# Patient Record
Sex: Male | Born: 1945 | Race: White | Hispanic: No | Marital: Married | Smoking: Former smoker
Health system: Southern US, Community
[De-identification: ages and names within clinical notes are randomized; demographics above are authoritative.]

## PROBLEM LIST (undated history)

## (undated) DIAGNOSIS — F0280 Dementia in other diseases classified elsewhere without behavioral disturbance: Secondary | ICD-10-CM

## (undated) DIAGNOSIS — F411 Generalized anxiety disorder: Secondary | ICD-10-CM

## (undated) DIAGNOSIS — M791 Myalgia, unspecified site: Secondary | ICD-10-CM

## (undated) DIAGNOSIS — I10 Essential (primary) hypertension: Secondary | ICD-10-CM

## (undated) DIAGNOSIS — E669 Obesity, unspecified: Secondary | ICD-10-CM

## (undated) DIAGNOSIS — G4733 Obstructive sleep apnea (adult) (pediatric): Secondary | ICD-10-CM

## (undated) DIAGNOSIS — M25519 Pain in unspecified shoulder: Secondary | ICD-10-CM

## (undated) DIAGNOSIS — R911 Solitary pulmonary nodule: Secondary | ICD-10-CM

## (undated) DIAGNOSIS — K219 Gastro-esophageal reflux disease without esophagitis: Secondary | ICD-10-CM

## (undated) DIAGNOSIS — R918 Other nonspecific abnormal finding of lung field: Secondary | ICD-10-CM

## (undated) DIAGNOSIS — I442 Atrioventricular block, complete: Secondary | ICD-10-CM

## (undated) DIAGNOSIS — E042 Nontoxic multinodular goiter: Secondary | ICD-10-CM

## (undated) DIAGNOSIS — I2510 Atherosclerotic heart disease of native coronary artery without angina pectoris: Secondary | ICD-10-CM

## (undated) DIAGNOSIS — K635 Polyp of colon: Secondary | ICD-10-CM

## (undated) DIAGNOSIS — E7800 Pure hypercholesterolemia, unspecified: Secondary | ICD-10-CM

## (undated) DIAGNOSIS — E785 Hyperlipidemia, unspecified: Secondary | ICD-10-CM

## (undated) DIAGNOSIS — G309 Alzheimer's disease, unspecified: Secondary | ICD-10-CM

## (undated) DIAGNOSIS — E559 Vitamin D deficiency, unspecified: Secondary | ICD-10-CM

## (undated) DIAGNOSIS — F329 Major depressive disorder, single episode, unspecified: Secondary | ICD-10-CM

## (undated) DIAGNOSIS — F3289 Other specified depressive episodes: Secondary | ICD-10-CM

## (undated) HISTORY — DX: Vitamin D deficiency, unspecified: E55.9

## (undated) HISTORY — DX: Other nonspecific abnormal finding of lung field: R91.8

## (undated) HISTORY — DX: Polyp of colon: K63.5

## (undated) HISTORY — DX: Solitary pulmonary nodule: R91.1

## (undated) HISTORY — PX: ACHILLES TENDON REPAIR: SUR1153

## (undated) HISTORY — DX: Hyperlipidemia, unspecified: E78.5

## (undated) HISTORY — DX: Major depressive disorder, single episode, unspecified: F32.9

## (undated) HISTORY — DX: Pain in unspecified shoulder: M25.519

## (undated) HISTORY — DX: Gastro-esophageal reflux disease without esophagitis: K21.9

## (undated) HISTORY — DX: Essential (primary) hypertension: I10

## (undated) HISTORY — DX: Myalgia, unspecified site: M79.10

## (undated) HISTORY — DX: Atherosclerotic heart disease of native coronary artery without angina pectoris: I25.10

## (undated) HISTORY — DX: Obesity, unspecified: E66.9

## (undated) HISTORY — DX: Obstructive sleep apnea (adult) (pediatric): G47.33

## (undated) HISTORY — DX: Pure hypercholesterolemia, unspecified: E78.00

## (undated) HISTORY — DX: Other specified depressive episodes: F32.89

## (undated) HISTORY — DX: Generalized anxiety disorder: F41.1

## (undated) HISTORY — DX: Nontoxic multinodular goiter: E04.2

---

## 1998-02-23 HISTORY — PX: CORONARY ANGIOPLASTY WITH STENT PLACEMENT: SHX49

## 1998-03-12 ENCOUNTER — Ambulatory Visit (HOSPITAL_COMMUNITY): Admission: RE | Admit: 1998-03-12 | Discharge: 1998-03-13 | Payer: Self-pay | Admitting: Interventional Cardiology

## 1998-03-17 ENCOUNTER — Encounter (HOSPITAL_COMMUNITY): Admission: RE | Admit: 1998-03-17 | Discharge: 1998-06-15 | Payer: Self-pay | Admitting: Interventional Cardiology

## 1998-03-19 ENCOUNTER — Observation Stay (HOSPITAL_COMMUNITY): Admission: AD | Admit: 1998-03-19 | Discharge: 1998-03-20 | Payer: Self-pay | Admitting: Interventional Cardiology

## 1998-06-16 ENCOUNTER — Encounter (HOSPITAL_COMMUNITY): Admission: RE | Admit: 1998-06-16 | Discharge: 1998-09-14 | Payer: Self-pay | Admitting: Interventional Cardiology

## 1998-10-21 ENCOUNTER — Ambulatory Visit (HOSPITAL_COMMUNITY): Admission: RE | Admit: 1998-10-21 | Discharge: 1998-10-21 | Payer: Self-pay | Admitting: Interventional Cardiology

## 2000-01-31 ENCOUNTER — Encounter: Admission: RE | Admit: 2000-01-31 | Discharge: 2000-04-30 | Payer: Self-pay | Admitting: Family Medicine

## 2000-02-16 ENCOUNTER — Encounter: Admission: RE | Admit: 2000-02-16 | Discharge: 2000-02-16 | Payer: Self-pay | Admitting: Family Medicine

## 2000-02-16 ENCOUNTER — Encounter: Payer: Self-pay | Admitting: Family Medicine

## 2000-03-09 ENCOUNTER — Ambulatory Visit (HOSPITAL_BASED_OUTPATIENT_CLINIC_OR_DEPARTMENT_OTHER): Admission: RE | Admit: 2000-03-09 | Discharge: 2000-03-09 | Payer: Self-pay | Admitting: Interventional Cardiology

## 2000-05-03 ENCOUNTER — Encounter: Payer: Self-pay | Admitting: Interventional Cardiology

## 2000-05-03 ENCOUNTER — Ambulatory Visit (HOSPITAL_COMMUNITY): Admission: RE | Admit: 2000-05-03 | Discharge: 2000-05-03 | Payer: Self-pay | Admitting: Interventional Cardiology

## 2000-08-25 ENCOUNTER — Emergency Department (HOSPITAL_COMMUNITY): Admission: EM | Admit: 2000-08-25 | Discharge: 2000-08-25 | Payer: Self-pay | Admitting: Emergency Medicine

## 2003-04-21 ENCOUNTER — Encounter: Admission: RE | Admit: 2003-04-21 | Discharge: 2003-04-21 | Payer: Self-pay | Admitting: Family Medicine

## 2003-06-11 ENCOUNTER — Encounter (INDEPENDENT_AMBULATORY_CARE_PROVIDER_SITE_OTHER): Payer: Self-pay | Admitting: Specialist

## 2003-06-11 ENCOUNTER — Ambulatory Visit (HOSPITAL_COMMUNITY): Admission: RE | Admit: 2003-06-11 | Discharge: 2003-06-11 | Payer: Self-pay | Admitting: *Deleted

## 2004-08-03 ENCOUNTER — Ambulatory Visit (HOSPITAL_COMMUNITY): Admission: RE | Admit: 2004-08-03 | Discharge: 2004-08-03 | Payer: Self-pay | Admitting: *Deleted

## 2004-08-03 ENCOUNTER — Encounter (INDEPENDENT_AMBULATORY_CARE_PROVIDER_SITE_OTHER): Payer: Self-pay | Admitting: Specialist

## 2006-12-13 ENCOUNTER — Encounter: Admission: RE | Admit: 2006-12-13 | Discharge: 2006-12-13 | Payer: Self-pay | Admitting: Interventional Cardiology

## 2006-12-31 ENCOUNTER — Encounter: Admission: RE | Admit: 2006-12-31 | Discharge: 2006-12-31 | Payer: Self-pay | Admitting: Interventional Cardiology

## 2007-03-05 ENCOUNTER — Other Ambulatory Visit: Admission: RE | Admit: 2007-03-05 | Discharge: 2007-03-05 | Payer: Self-pay | Admitting: Interventional Radiology

## 2007-03-05 ENCOUNTER — Encounter: Admission: RE | Admit: 2007-03-05 | Discharge: 2007-03-05 | Payer: Self-pay | Admitting: Endocrinology

## 2007-03-05 ENCOUNTER — Encounter (INDEPENDENT_AMBULATORY_CARE_PROVIDER_SITE_OTHER): Payer: Self-pay | Admitting: Interventional Radiology

## 2007-09-16 ENCOUNTER — Emergency Department (HOSPITAL_BASED_OUTPATIENT_CLINIC_OR_DEPARTMENT_OTHER): Admission: EM | Admit: 2007-09-16 | Discharge: 2007-09-16 | Payer: Self-pay | Admitting: Emergency Medicine

## 2008-01-13 ENCOUNTER — Encounter: Admission: RE | Admit: 2008-01-13 | Discharge: 2008-01-13 | Payer: Self-pay | Admitting: Interventional Cardiology

## 2008-01-14 ENCOUNTER — Inpatient Hospital Stay (HOSPITAL_BASED_OUTPATIENT_CLINIC_OR_DEPARTMENT_OTHER): Admission: RE | Admit: 2008-01-14 | Discharge: 2008-01-14 | Payer: Self-pay | Admitting: Interventional Cardiology

## 2008-01-14 ENCOUNTER — Encounter: Payer: Self-pay | Admitting: Surgery

## 2008-01-14 ENCOUNTER — Ambulatory Visit (HOSPITAL_COMMUNITY): Admission: RE | Admit: 2008-01-14 | Discharge: 2008-01-14 | Payer: Self-pay | Admitting: Surgery

## 2008-01-14 ENCOUNTER — Ambulatory Visit: Payer: Self-pay | Admitting: Vascular Surgery

## 2008-01-21 ENCOUNTER — Ambulatory Visit: Payer: Self-pay | Admitting: Surgery

## 2008-03-26 ENCOUNTER — Encounter: Admission: RE | Admit: 2008-03-26 | Discharge: 2008-03-26 | Payer: Self-pay | Admitting: Internal Medicine

## 2009-05-29 ENCOUNTER — Encounter: Admission: RE | Admit: 2009-05-29 | Discharge: 2009-05-29 | Payer: Self-pay | Admitting: Neurology

## 2009-06-30 ENCOUNTER — Ambulatory Visit (HOSPITAL_BASED_OUTPATIENT_CLINIC_OR_DEPARTMENT_OTHER): Admission: RE | Admit: 2009-06-30 | Discharge: 2009-06-30 | Payer: Self-pay | Admitting: Neurology

## 2009-07-03 ENCOUNTER — Ambulatory Visit: Payer: Self-pay | Admitting: Internal Medicine

## 2009-10-03 ENCOUNTER — Ambulatory Visit: Payer: Self-pay | Admitting: Diagnostic Radiology

## 2009-10-03 ENCOUNTER — Emergency Department (HOSPITAL_BASED_OUTPATIENT_CLINIC_OR_DEPARTMENT_OTHER): Admission: EM | Admit: 2009-10-03 | Discharge: 2009-10-03 | Payer: Self-pay | Admitting: Emergency Medicine

## 2009-10-15 ENCOUNTER — Ambulatory Visit: Payer: Self-pay | Admitting: Internal Medicine

## 2009-10-15 ENCOUNTER — Inpatient Hospital Stay (HOSPITAL_COMMUNITY): Admission: EM | Admit: 2009-10-15 | Discharge: 2009-10-16 | Payer: Self-pay | Admitting: Emergency Medicine

## 2009-10-27 ENCOUNTER — Ambulatory Visit (HOSPITAL_COMMUNITY): Admission: RE | Admit: 2009-10-27 | Discharge: 2009-10-27 | Payer: Self-pay | Admitting: Family Medicine

## 2009-11-09 ENCOUNTER — Ambulatory Visit: Payer: Self-pay | Admitting: Thoracic Surgery

## 2009-11-12 ENCOUNTER — Ambulatory Visit (HOSPITAL_COMMUNITY): Admission: RE | Admit: 2009-11-12 | Discharge: 2009-11-12 | Payer: Self-pay | Admitting: Interventional Cardiology

## 2009-11-17 ENCOUNTER — Ambulatory Visit (HOSPITAL_COMMUNITY): Admission: RE | Admit: 2009-11-17 | Discharge: 2009-11-17 | Payer: Self-pay | Admitting: Thoracic Surgery

## 2009-11-23 ENCOUNTER — Inpatient Hospital Stay (HOSPITAL_BASED_OUTPATIENT_CLINIC_OR_DEPARTMENT_OTHER): Admission: RE | Admit: 2009-11-23 | Discharge: 2009-11-23 | Payer: Self-pay | Admitting: Interventional Cardiology

## 2009-12-01 ENCOUNTER — Ambulatory Visit: Payer: Self-pay | Admitting: Thoracic Surgery

## 2010-01-19 ENCOUNTER — Encounter
Admission: RE | Admit: 2010-01-19 | Discharge: 2010-01-19 | Payer: Self-pay | Source: Home / Self Care | Attending: Thoracic Surgery | Admitting: Thoracic Surgery

## 2010-01-19 ENCOUNTER — Ambulatory Visit: Payer: Self-pay | Admitting: Thoracic Surgery

## 2010-02-12 ENCOUNTER — Other Ambulatory Visit: Payer: Self-pay | Admitting: Thoracic Surgery

## 2010-02-12 ENCOUNTER — Encounter: Payer: Self-pay | Admitting: Family Medicine

## 2010-02-12 DIAGNOSIS — R911 Solitary pulmonary nodule: Secondary | ICD-10-CM

## 2010-03-16 ENCOUNTER — Other Ambulatory Visit: Payer: Self-pay | Admitting: Internal Medicine

## 2010-03-16 DIAGNOSIS — E049 Nontoxic goiter, unspecified: Secondary | ICD-10-CM

## 2010-03-17 ENCOUNTER — Ambulatory Visit
Admission: RE | Admit: 2010-03-17 | Discharge: 2010-03-17 | Disposition: A | Discharge status: Home / Self Care | Payer: BC Managed Care – PPO | Source: Ambulatory Visit | Attending: Internal Medicine | Admitting: Internal Medicine

## 2010-03-17 DIAGNOSIS — E049 Nontoxic goiter, unspecified: Secondary | ICD-10-CM

## 2010-03-22 ENCOUNTER — Ambulatory Visit: Payer: Self-pay | Admitting: Thoracic Surgery

## 2010-03-22 ENCOUNTER — Other Ambulatory Visit: Payer: Self-pay

## 2010-04-01 ENCOUNTER — Other Ambulatory Visit: Payer: Self-pay | Admitting: Internal Medicine

## 2010-04-05 ENCOUNTER — Ambulatory Visit
Admission: RE | Admit: 2010-04-05 | Discharge: 2010-04-05 | Disposition: A | Discharge status: Home / Self Care | Payer: BC Managed Care – PPO | Source: Ambulatory Visit | Attending: Thoracic Surgery | Admitting: Thoracic Surgery

## 2010-04-05 ENCOUNTER — Ambulatory Visit (INDEPENDENT_AMBULATORY_CARE_PROVIDER_SITE_OTHER): Payer: BC Managed Care – PPO | Admitting: Thoracic Surgery

## 2010-04-05 DIAGNOSIS — R911 Solitary pulmonary nodule: Secondary | ICD-10-CM

## 2010-04-05 DIAGNOSIS — J8410 Pulmonary fibrosis, unspecified: Secondary | ICD-10-CM

## 2010-04-06 LAB — CBC
MCH: 29.6 pg (ref 26.0–34.0)
MCHC: 34.1 g/dL (ref 30.0–36.0)
MCV: 86.8 fL (ref 78.0–100.0)
Platelets: 246 10*3/uL (ref 150–400)

## 2010-04-06 LAB — APTT: aPTT: 29 seconds (ref 24–37)

## 2010-04-06 LAB — PROTIME-INR: INR: 0.99 (ref 0.00–1.49)

## 2010-04-06 NOTE — Assessment & Plan Note (Signed)
OFFICE VISIT  DUVID, SMALLS DOB:  [birth year]                                        April 05, 2010 CHART #:  [id number]  Mr. Dunlow comes in today for 62-month followup.  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  Since his last visit, he has had no shortness of breath and has otherwise been stable from a medical standpoint.  PHYSICAL EXAMINATION:  Vital Signs:  Blood pressure 128/80, pulse of 64, respirations 18, O2 sat 95% on room air.  Heart:  Regular rate and rhythm without murmurs, rubs, or gallops.  Lungs are clear.  Chest CT today shows significant improvement in the left lower lobe process.  Now, there is just a small area of haziness in this area.  ASSESSMENT AND PLAN:  Dr. [name] saw the patient today and reviewed his films.  We will plan to see him back in 6 months with a repeat CT scan. The patient has an appointment this week with Dr. [name] regarding his coronary artery disease and Dr. [name] has given him the okay to proceed with CABG if he desires to do that at this time.  We will see him back in 6 months with a CT scan.  Coral Ceo, P.A.  GC/MEDQ  D:  04/05/2010  T:  04/06/2010  Job:  [id number]  cc:   Lyn Records, M.D.

## 2010-04-07 LAB — URINALYSIS, ROUTINE W REFLEX MICROSCOPIC
Nitrite: NEGATIVE
Specific Gravity, Urine: 1.036 — ABNORMAL HIGH (ref 1.005–1.030)
Urobilinogen, UA: 0.2 mg/dL (ref 0.0–1.0)
pH: 5.5 (ref 5.0–8.0)

## 2010-04-07 LAB — TROPONIN I: Troponin I: 0.01 ng/mL (ref 0.00–0.06)

## 2010-04-07 LAB — DIFFERENTIAL
Basophils Absolute: 0 10*3/uL (ref 0.0–0.1)
Basophils Relative: 0 % (ref 0–1)
Eosinophils Absolute: 0.3 10*3/uL (ref 0.0–0.7)
Eosinophils Relative: 4 % (ref 0–5)
Lymphocytes Relative: 23 % (ref 12–46)
Monocytes Absolute: 1 10*3/uL (ref 0.1–1.0)
Monocytes Relative: 11 % (ref 3–12)
Monocytes Relative: 10 % (ref 3–12)
Neutro Abs: 5.6 10*3/uL (ref 1.7–7.7)
Neutro Abs: 4.8 10*3/uL (ref 1.7–7.7)
Neutrophils Relative %: 63 % (ref 43–77)

## 2010-04-07 LAB — BASIC METABOLIC PANEL
Calcium: 9.2 mg/dL (ref 8.4–10.5)
GFR calc Af Amer: >60 mL/min (ref >60)
GFR calc non Af Amer: >60 mL/min (ref >60)
Glucose, Bld: 103 mg/dL — ABNORMAL HIGH (ref 70–99)
Potassium: 3.9 mEq/L (ref 3.5–5.1)
Sodium: 143 mEq/L (ref 135–145)

## 2010-04-07 LAB — CK TOTAL AND CKMB (NOT AT ARMC)
CK, MB: 11.2 ng/mL (ref 0.3–4.0)
Relative Index: 4.3 — ABNORMAL HIGH (ref 0.0–2.5)

## 2010-04-07 LAB — POCT I-STAT, CHEM 8
BUN: 15 mg/dL (ref 6–23)
Glucose, Bld: 96 mg/dL (ref 70–99)
HCT: 40 % (ref 39.0–52.0)
Potassium: 3.8 mEq/L (ref 3.5–5.1)

## 2010-04-07 LAB — CBC
HCT: 37.8 % — ABNORMAL LOW (ref 39.0–52.0)
HCT: 38.2 % — ABNORMAL LOW (ref 39.0–52.0)
Hemoglobin: 13.1 g/dL (ref 13.0–17.0)
Hemoglobin: 13.3 g/dL (ref 13.0–17.0)
MCHC: 34.9 g/dL (ref 30.0–36.0)
RBC: 4.38 MIL/uL (ref 4.22–5.81)
RBC: 4.28 MIL/uL (ref 4.22–5.81)
WBC: 9.7 10*3/uL (ref 4.0–10.5)
WBC: 7.6 10*3/uL (ref 4.0–10.5)

## 2010-04-07 LAB — LIPID PANEL
Total CHOL/HDL Ratio: 3.4 RATIO
VLDL: 16 mg/dL (ref 0–40)

## 2010-04-07 LAB — GLUCOSE, CAPILLARY: Glucose-Capillary: 94 mg/dL (ref 70–99)

## 2010-04-07 LAB — POCT CARDIAC MARKERS: CKMB, poc: 9.1 ng/mL (ref 1.0–8.0)

## 2010-04-07 LAB — CARDIAC PANEL(CRET KIN+CKTOT+MB+TROPI)
CK, MB: 12.6 ng/mL (ref 0.3–4.0)
Total CK: 265 U/L — ABNORMAL HIGH (ref 7–232)
Troponin I: 0.01 ng/mL (ref 0.00–0.06)
Troponin I: 0.02 ng/mL (ref 0.00–0.06)

## 2010-04-07 LAB — PROTIME-INR
INR: 0.88 (ref 0.00–1.49)
Prothrombin Time: 12.1 seconds (ref 11.6–15.2)

## 2010-04-28 ENCOUNTER — Encounter (INDEPENDENT_AMBULATORY_CARE_PROVIDER_SITE_OTHER): Payer: BC Managed Care – PPO | Admitting: Cardiothoracic Surgery

## 2010-04-28 DIAGNOSIS — I2510 Atherosclerotic heart disease of native coronary artery without angina pectoris: Secondary | ICD-10-CM

## 2010-05-13 ENCOUNTER — Ambulatory Visit (HOSPITAL_COMMUNITY)
Admission: RE | Admit: 2010-05-13 | Discharge: 2010-05-13 | Disposition: A | Discharge status: Home / Self Care | Payer: BC Managed Care – PPO | Source: Ambulatory Visit | Attending: Cardiothoracic Surgery | Admitting: Cardiothoracic Surgery

## 2010-05-13 ENCOUNTER — Encounter (HOSPITAL_COMMUNITY)
Admission: RE | Admit: 2010-05-13 | Discharge: 2010-05-13 | Disposition: A | Discharge status: Home / Self Care | Payer: BC Managed Care – PPO | Source: Ambulatory Visit | Attending: Cardiothoracic Surgery | Admitting: Cardiothoracic Surgery

## 2010-05-13 ENCOUNTER — Other Ambulatory Visit: Payer: Self-pay | Admitting: Cardiothoracic Surgery

## 2010-05-13 DIAGNOSIS — Z01818 Encounter for other preprocedural examination: Secondary | ICD-10-CM | POA: Insufficient documentation

## 2010-05-13 DIAGNOSIS — I2510 Atherosclerotic heart disease of native coronary artery without angina pectoris: Secondary | ICD-10-CM | POA: Insufficient documentation

## 2010-05-13 DIAGNOSIS — Z01812 Encounter for preprocedural laboratory examination: Secondary | ICD-10-CM | POA: Insufficient documentation

## 2010-05-13 DIAGNOSIS — J984 Other disorders of lung: Secondary | ICD-10-CM | POA: Insufficient documentation

## 2010-05-13 DIAGNOSIS — Z01810 Encounter for preprocedural cardiovascular examination: Secondary | ICD-10-CM | POA: Insufficient documentation

## 2010-05-13 LAB — BLOOD GAS, ARTERIAL
Acid-base deficit: 2.4 mmol/L — ABNORMAL HIGH (ref 0.0–2.0)
Bicarbonate: 21.2 mEq/L (ref 20.0–24.0)
Drawn by: 344381
FIO2: 0.21 %
O2 Saturation: 97.7 %
Patient temperature: 98.6
TCO2: 22.1 mmol/L (ref 0–100)
pCO2 arterial: 31.8 mmHg — ABNORMAL LOW (ref 35.0–45.0)
pH, Arterial: 7.439 (ref 7.350–7.450)
pO2, Arterial: 89.7 mmHg (ref 80.0–100.0)

## 2010-05-13 LAB — CBC
HCT: 42.1 % (ref 39.0–52.0)
Hemoglobin: 14.8 g/dL (ref 13.0–17.0)
MCH: 30.4 pg (ref 26.0–34.0)
MCHC: 35.2 g/dL (ref 30.0–36.0)
MCV: 86.4 fL (ref 78.0–100.0)
Platelets: 197 10*3/uL (ref 150–400)
RBC: 4.87 MIL/uL (ref 4.22–5.81)
RDW: 15.6 % — ABNORMAL HIGH (ref 11.5–15.5)
WBC: 8.5 10*3/uL (ref 4.0–10.5)

## 2010-05-13 LAB — TYPE AND SCREEN
ABO/RH(D): O POS
Antibody Screen: NEGATIVE

## 2010-05-13 LAB — SURGICAL PCR SCREEN
MRSA, PCR: NEGATIVE
Staphylococcus aureus: POSITIVE — AB

## 2010-05-13 LAB — COMPREHENSIVE METABOLIC PANEL
ALT: 29 U/L (ref 0–53)
AST: 32 U/L (ref 0–37)
Albumin: 3.7 g/dL (ref 3.5–5.2)
Alkaline Phosphatase: 71 U/L (ref 39–117)
BUN: 15 mg/dL (ref 6–23)
CO2: 28 mEq/L (ref 19–32)
Calcium: 8.9 mg/dL (ref 8.4–10.5)
Chloride: 107 mEq/L (ref 96–112)
Creatinine, Ser: 1.06 mg/dL (ref 0.4–1.5)
GFR calc Af Amer: >60 mL/min (ref >60)
GFR calc non Af Amer: >60 mL/min (ref >60)
Glucose, Bld: 129 mg/dL — ABNORMAL HIGH (ref 70–99)
Potassium: 4.8 mEq/L (ref 3.5–5.1)
Sodium: 138 mEq/L (ref 135–145)
Total Bilirubin: 0.4 mg/dL (ref 0.3–1.2)
Total Protein: 6.7 g/dL (ref 6.0–8.3)

## 2010-05-13 LAB — APTT: aPTT: 28 seconds (ref 24–37)

## 2010-05-13 LAB — URINALYSIS, ROUTINE W REFLEX MICROSCOPIC
Bilirubin Urine: NEGATIVE
Glucose, UA: NEGATIVE mg/dL
Hgb urine dipstick: NEGATIVE
Ketones, ur: NEGATIVE mg/dL
Nitrite: NEGATIVE
Protein, ur: NEGATIVE mg/dL
Specific Gravity, Urine: 1.024 (ref 1.005–1.030)
Urobilinogen, UA: 0.2 mg/dL (ref 0.0–1.0)
pH: 6 (ref 5.0–8.0)

## 2010-05-13 LAB — PROTIME-INR
INR: 0.96 (ref 0.00–1.49)
Prothrombin Time: 13 seconds (ref 11.6–15.2)

## 2010-05-13 LAB — ABO/RH: ABO/RH(D): O POS

## 2010-05-15 DIAGNOSIS — I2510 Atherosclerotic heart disease of native coronary artery without angina pectoris: Secondary | ICD-10-CM

## 2010-05-15 DIAGNOSIS — Z01810 Encounter for preprocedural cardiovascular examination: Secondary | ICD-10-CM

## 2010-05-16 NOTE — Consult Note (Signed)
NAME:  [name] NO.:  [id number]  MEDICAL RECORD NO.:  [id number]           PATIENT TYPE:  LOCATION:                                 FACILITY:  PHYSICIAN:  Sheliah Plane, MD    DATE OF BIRTH:  08/19/1945  DATE OF CONSULTATION: DATE OF DISCHARGE:                                CONSULTATION   REQUESTING PHYSICIAN:  Lyn Records, MD  CARDIOLOGIST:  Lyn Records, MD  PRIMARY CARE PHYSICIAN:  Doris Cheadle. Foy Guadalajara, MD  REASON FOR CONSULTATION:  Left main coronary obstructive disease.  BRIEF HISTORY:  The patient is a 65 year old male who was originally seen by Dr. [name] 2 years ago who recommended coronary artery bypass grafting for left main obstruction.  At that time, the patient was not interested in proceeding with cardiac surgery.  He has been maintained medically.  In the meantime, he has also developed a lesion in the left lung, which he underwent a needle biopsy for and was found to be inflammatory disease and subsequent serial CTs have shown the mass to slowly decreased.  The patient continues to have angina, using nitroglycerin two to three times a month.  While being evaluated for the potential need for a lung resection, he underwent repeat cardiac catheterization in November 2011 and again had at least a 70% left main and 90% lesions of the right coronary artery, and a small nondominant circumflex system.  He now comes to the office today for his second opinion concerning cardiac surgery.  He has had no previous history of myocardial infarction.  He has had history of bare metal stent placed in his LAD in 2000 and has subsequent in-stent stenosis.  Cardiac risk factors include hypertension, hyperlipidemia.  He denies diabetes.  He is a remote smoker, quit in 1981.  He has had no previous stroke and has no renal insufficiency.  Past medical problems include as above coronary artery disease significant, previously recommended to have bypass  surgery 2 years ago, has a history of sleep apnea, hypertension, hypercholesterolemia, multinodular goiter, colon polyps.  Previous surgery includes left Achilles heel tendon repair.  SOCIAL HISTORY:  The patient is a former smoker, drinks 2-3 beers a day, employed in accounting.  The patient is married, has 2 boys and 2 girls.  FAMILY HISTORY:  The patient's father died of myocardial infarction in his 27s.  Mother died of congestive heart failure and diabetes.  CURRENT MEDICATIONS: 1. Toprol-XL 100 mg a day. 2. Imdur 120 mg a day. 3. P.r.n. nitroglycerin. 4. Vicodin p.r.n. for back pain. 5. Flexeril t.i.d. p.r.n. 6. Omeprazole 20 daily. 7. Sertraline 100 mg 1-1/2 tabs a day. 8. Vitamin D. 9. Aspirin 325 a day. 10.Amoxicillin for predental care, it is unclear why he is on this. 11.Crestor 40 mg a day. 12.Fish oil 300 mg a day. 13.Folic acid 400 mg a day. 14.Vitamin B12. 15.Vitamin E. 16.Calcium. 17.CoQ10.  He has no known allergies.  REVIEW OF SYSTEMS:  CARDIAC:  Positive for chest pain, resting shortness of breath, exertional shortness of breath.  Denies lower extremity edema, palpitations, syncope, presyncope, or  orthopnea.  GENERAL:  The patient has had some weight gain, notes floaters in his eyes.  Denies amaurosis.  Had a negative TB test.  He has had history of reflux esophagitis.  Notes increase in hard-of-hearing.  Has sleep apnea.  Has had a colonoscopy.  He is up-to-date on flu shot.  Pneumococcal vaccination is unknown.  The patient's wife does note that he has had some neuro psych testing for question of early dementia.  PHYSICAL EXAMINATION:  VITAL SIGNS:  His blood pressure 141/82, pulse 65, respiratory rate is 20, O2 sats 97% on room air, he is 5 feet and 11 inches tall, 235 pounds. GENERAL:  The patient is awake, alert, neurologically intact, and able to relate his history in good detail. NECK:  He has no carotid bruits. LUNGS:  Clear  bilaterally. CARDIAC:  Regular rate and rhythm.  I do not appreciate any murmur, rub, or gallop. ABDOMEN:  Benign. EXTREMITIES:  Lower extremities have 2+ DP and PT pulses bilaterally. Appears to have adequate vein in both lower extremities for bypass.  The patient's cardiac catheterization films from November 2011 are reviewed and I agree with Dr. [name] that the patient has significant distal left main disease of at least 70% in-stent stenosis of the LAD of 70% intermediate.  LAD without significant disease, but jeopardized by the left main, 2 small circumflex branches with 70% disease, a moderate- sized dominant right coronary artery with 90% PD and PL stenosis. Overall ventricular function appeared preserved.  With the patient's symptoms and left main disease, I have reiterated to him that coronary artery bypass grafting is his best treatment option for relief of symptoms and preservation of life and LV function.  It is somewhat concerning that he has delayed treatment as long as he has offered him to proceed next week with surgery.  He has declined this, but is willing to proceed on May 17, 2010.  In the meantime, I have asked him to continue taking his aspirin, but avoid Flexeril, fish oil, and vitamin E.  The risks of surgery including death, infection, stroke, myocardial infarction, bleeding, blood transfusion have all been discussed with the patient and his wife in detail and he is willing to proceed now.     Sheliah Plane, MD     EG/MEDQ  D:  04/28/2010  T:  04/29/2010  Job:  [id number]  cc:   Lyn Records, M.D.  Electronically Signed by Sheliah Plane MD on 05/16/2010 11:06:42 AM

## 2010-05-17 ENCOUNTER — Inpatient Hospital Stay (HOSPITAL_COMMUNITY)
Admission: RE | Admit: 2010-05-17 | Discharge: 2010-05-22 | DRG: 109 | Disposition: A | Discharge status: Home Health | Payer: BC Managed Care – PPO | Source: Ambulatory Visit | Attending: Cardiothoracic Surgery | Admitting: Cardiothoracic Surgery

## 2010-05-17 ENCOUNTER — Inpatient Hospital Stay (HOSPITAL_COMMUNITY): Payer: BC Managed Care – PPO

## 2010-05-17 DIAGNOSIS — E8779 Other fluid overload: Secondary | ICD-10-CM | POA: Diagnosis not present

## 2010-05-17 DIAGNOSIS — I10 Essential (primary) hypertension: Secondary | ICD-10-CM | POA: Diagnosis present

## 2010-05-17 DIAGNOSIS — D62 Acute posthemorrhagic anemia: Secondary | ICD-10-CM | POA: Diagnosis not present

## 2010-05-17 DIAGNOSIS — Z9861 Coronary angioplasty status: Secondary | ICD-10-CM

## 2010-05-17 DIAGNOSIS — E785 Hyperlipidemia, unspecified: Secondary | ICD-10-CM | POA: Diagnosis present

## 2010-05-17 DIAGNOSIS — I2510 Atherosclerotic heart disease of native coronary artery without angina pectoris: Secondary | ICD-10-CM

## 2010-05-17 DIAGNOSIS — Z7982 Long term (current) use of aspirin: Secondary | ICD-10-CM

## 2010-05-17 DIAGNOSIS — Z87891 Personal history of nicotine dependence: Secondary | ICD-10-CM

## 2010-05-17 HISTORY — PX: OTHER SURGICAL HISTORY: SHX169

## 2010-05-17 LAB — POCT I-STAT 3, ART BLOOD GAS (G3+)
Acid-base deficit: 1 mmol/L (ref 0.0–2.0)
Acid-base deficit: 1 mmol/L (ref 0.0–2.0)
Acid-base deficit: 2 mmol/L (ref 0.0–2.0)
Acid-base deficit: 3 mmol/L — ABNORMAL HIGH (ref 0.0–2.0)
Bicarbonate: 25.7 mEq/L — ABNORMAL HIGH (ref 20.0–24.0)
Bicarbonate: 24 mEq/L (ref 20.0–24.0)
Bicarbonate: 24.2 mEq/L — ABNORMAL HIGH (ref 20.0–24.0)
Bicarbonate: 23.8 mEq/L (ref 20.0–24.0)
O2 Saturation: 100 %
O2 Saturation: 99 %
O2 Saturation: 94 %
O2 Saturation: 96 %
Patient temperature: 34.7
Patient temperature: 37.3
TCO2: 27 mmol/L (ref 0–100)
TCO2: 25 mmol/L (ref 0–100)
TCO2: 26 mmol/L (ref 0–100)
TCO2: 25 mmol/L (ref 0–100)
pCO2 arterial: 49.6 mmHg — ABNORMAL HIGH (ref 35.0–45.0)
pCO2 arterial: 40.9 mmHg (ref 35.0–45.0)
pCO2 arterial: 39.7 mmHg (ref 35.0–45.0)
pCO2 arterial: 46.8 mmHg — ABNORMAL HIGH (ref 35.0–45.0)
pH, Arterial: 7.322 — ABNORMAL LOW (ref 7.350–7.450)
pH, Arterial: 7.377 (ref 7.350–7.450)
pH, Arterial: 7.382 (ref 7.350–7.450)
pH, Arterial: 7.315 — ABNORMAL LOW (ref 7.350–7.450)
pO2, Arterial: 260 mmHg — ABNORMAL HIGH (ref 80.0–100.0)
pO2, Arterial: 142 mmHg — ABNORMAL HIGH (ref 80.0–100.0)
pO2, Arterial: 64 mmHg — ABNORMAL LOW (ref 80.0–100.0)
pO2, Arterial: 94 mmHg (ref 80.0–100.0)

## 2010-05-17 LAB — MAGNESIUM: Magnesium: 2.9 mg/dL — ABNORMAL HIGH (ref 1.5–2.5)

## 2010-05-17 LAB — POCT I-STAT 3, VENOUS BLOOD GAS (G3P V)
Acid-base deficit: 2 mmol/L (ref 0.0–2.0)
Bicarbonate: 25 mEq/L — ABNORMAL HIGH (ref 20.0–24.0)
O2 Saturation: 74 %
TCO2: 27 mmol/L (ref 0–100)
pCO2, Ven: 52.5 mmHg — ABNORMAL HIGH (ref 45.0–50.0)
pH, Ven: 7.286 (ref 7.250–7.300)
pO2, Ven: 45 mmHg (ref 30.0–45.0)

## 2010-05-17 LAB — POCT I-STAT, CHEM 8
BUN: 14 mg/dL (ref 6–23)
Calcium, Ion: 1.05 mmol/L — ABNORMAL LOW (ref 1.12–1.32)
Chloride: 107 mEq/L (ref 96–112)
Creatinine, Ser: 0.9 mg/dL (ref 0.4–1.5)
Glucose, Bld: 139 mg/dL — ABNORMAL HIGH (ref 70–99)
HCT: 37 % — ABNORMAL LOW (ref 39.0–52.0)
Hemoglobin: 12.6 g/dL — ABNORMAL LOW (ref 13.0–17.0)
Potassium: 4.4 mEq/L (ref 3.5–5.1)
Sodium: 141 mEq/L (ref 135–145)
TCO2: 23 mmol/L (ref 0–100)

## 2010-05-17 LAB — GLUCOSE, CAPILLARY
Glucose-Capillary: 74 mg/dL (ref 70–99)
Glucose-Capillary: 79 mg/dL (ref 70–99)
Glucose-Capillary: 131 mg/dL — ABNORMAL HIGH (ref 70–99)
Glucose-Capillary: 142 mg/dL — ABNORMAL HIGH (ref 70–99)

## 2010-05-17 LAB — POCT I-STAT 4, (NA,K, GLUC, HGB,HCT)
Glucose, Bld: 106 mg/dL — ABNORMAL HIGH (ref 70–99)
Glucose, Bld: 106 mg/dL — ABNORMAL HIGH (ref 70–99)
Glucose, Bld: 95 mg/dL (ref 70–99)
Glucose, Bld: 104 mg/dL — ABNORMAL HIGH (ref 70–99)
Glucose, Bld: 115 mg/dL — ABNORMAL HIGH (ref 70–99)
Glucose, Bld: 104 mg/dL — ABNORMAL HIGH (ref 70–99)
HCT: 40 % (ref 39.0–52.0)
HCT: 29 % — ABNORMAL LOW (ref 39.0–52.0)
HCT: 38 % — ABNORMAL LOW (ref 39.0–52.0)
HCT: 32 % — ABNORMAL LOW (ref 39.0–52.0)
HCT: 32 % — ABNORMAL LOW (ref 39.0–52.0)
HCT: 35 % — ABNORMAL LOW (ref 39.0–52.0)
Hemoglobin: 13.6 g/dL (ref 13.0–17.0)
Hemoglobin: 12.9 g/dL — ABNORMAL LOW (ref 13.0–17.0)
Hemoglobin: 9.9 g/dL — ABNORMAL LOW (ref 13.0–17.0)
Hemoglobin: 10.9 g/dL — ABNORMAL LOW (ref 13.0–17.0)
Hemoglobin: 10.9 g/dL — ABNORMAL LOW (ref 13.0–17.0)
Hemoglobin: 11.9 g/dL — ABNORMAL LOW (ref 13.0–17.0)
Potassium: 4.1 mEq/L (ref 3.5–5.1)
Potassium: 4.1 mEq/L (ref 3.5–5.1)
Potassium: 4.3 mEq/L (ref 3.5–5.1)
Potassium: 5 mEq/L (ref 3.5–5.1)
Potassium: 4.2 mEq/L (ref 3.5–5.1)
Potassium: 3.7 mEq/L (ref 3.5–5.1)
Sodium: 142 mEq/L (ref 135–145)
Sodium: 134 mEq/L — ABNORMAL LOW (ref 135–145)
Sodium: 141 mEq/L (ref 135–145)
Sodium: 136 mEq/L (ref 135–145)
Sodium: 140 mEq/L (ref 135–145)
Sodium: 142 mEq/L (ref 135–145)

## 2010-05-17 LAB — APTT: aPTT: 32 seconds (ref 24–37)

## 2010-05-17 LAB — CBC
HCT: 34.5 % — ABNORMAL LOW (ref 39.0–52.0)
HCT: 35.9 % — ABNORMAL LOW (ref 39.0–52.0)
Hemoglobin: 12 g/dL — ABNORMAL LOW (ref 13.0–17.0)
Hemoglobin: 12.5 g/dL — ABNORMAL LOW (ref 13.0–17.0)
MCH: 29.8 pg (ref 26.0–34.0)
MCH: 29.9 pg (ref 26.0–34.0)
MCHC: 34.8 g/dL (ref 30.0–36.0)
MCHC: 34.8 g/dL (ref 30.0–36.0)
MCV: 85.6 fL (ref 78.0–100.0)
MCV: 85.9 fL (ref 78.0–100.0)
Platelets: 114 10*3/uL — ABNORMAL LOW (ref 150–400)
Platelets: 135 10*3/uL — ABNORMAL LOW (ref 150–400)
RBC: 4.03 MIL/uL — ABNORMAL LOW (ref 4.22–5.81)
RBC: 4.18 MIL/uL — ABNORMAL LOW (ref 4.22–5.81)
RDW: 15.1 % (ref 11.5–15.5)
RDW: 15.4 % (ref 11.5–15.5)
WBC: 12.9 10*3/uL — ABNORMAL HIGH (ref 4.0–10.5)
WBC: 15.9 10*3/uL — ABNORMAL HIGH (ref 4.0–10.5)

## 2010-05-17 LAB — PLATELET COUNT: Platelets: 144 10*3/uL — ABNORMAL LOW (ref 150–400)

## 2010-05-17 LAB — HEMOGLOBIN AND HEMATOCRIT, BLOOD
HCT: 31.5 % — ABNORMAL LOW (ref 39.0–52.0)
Hemoglobin: 11.1 g/dL — ABNORMAL LOW (ref 13.0–17.0)

## 2010-05-17 LAB — CREATININE, SERUM
Creatinine, Ser: 0.88 mg/dL (ref 0.4–1.5)
GFR calc Af Amer: >60 mL/min (ref >60)
GFR calc non Af Amer: >60 mL/min (ref >60)

## 2010-05-17 LAB — POCT I-STAT GLUCOSE
Glucose, Bld: 109 mg/dL — ABNORMAL HIGH (ref 70–99)
Glucose, Bld: 96 mg/dL (ref 70–99)
Operator id: 156951
Operator id: 3342

## 2010-05-17 LAB — PROTIME-INR
INR: 1.34 (ref 0.00–1.49)
Prothrombin Time: 16.8 seconds — ABNORMAL HIGH (ref 11.6–15.2)

## 2010-05-18 ENCOUNTER — Inpatient Hospital Stay (HOSPITAL_COMMUNITY): Payer: BC Managed Care – PPO

## 2010-05-18 LAB — BASIC METABOLIC PANEL
BUN: 16 mg/dL (ref 6–23)
CO2: 24 mEq/L (ref 19–32)
Calcium: 7.6 mg/dL — ABNORMAL LOW (ref 8.4–10.5)
Chloride: 111 mEq/L (ref 96–112)
Creatinine, Ser: 0.9 mg/dL (ref 0.4–1.5)
GFR calc Af Amer: >60 mL/min (ref >60)
GFR calc non Af Amer: >60 mL/min (ref >60)
Glucose, Bld: 145 mg/dL — ABNORMAL HIGH (ref 70–99)
Potassium: 4.2 mEq/L (ref 3.5–5.1)
Sodium: 141 mEq/L (ref 135–145)

## 2010-05-18 LAB — GLUCOSE, CAPILLARY
Glucose-Capillary: 124 mg/dL — ABNORMAL HIGH (ref 70–99)
Glucose-Capillary: 133 mg/dL — ABNORMAL HIGH (ref 70–99)
Glucose-Capillary: 146 mg/dL — ABNORMAL HIGH (ref 70–99)
Glucose-Capillary: 161 mg/dL — ABNORMAL HIGH (ref 70–99)
Glucose-Capillary: 170 mg/dL — ABNORMAL HIGH (ref 70–99)
Glucose-Capillary: 144 mg/dL — ABNORMAL HIGH (ref 70–99)

## 2010-05-18 LAB — POCT I-STAT 4, (NA,K, GLUC, HGB,HCT)
Glucose, Bld: 112 mg/dL — ABNORMAL HIGH (ref 70–99)
HCT: 35 % — ABNORMAL LOW (ref 39.0–52.0)
Hemoglobin: 11.9 g/dL — ABNORMAL LOW (ref 13.0–17.0)
Potassium: 3.9 mEq/L (ref 3.5–5.1)
Sodium: 142 mEq/L (ref 135–145)

## 2010-05-18 LAB — CBC
HCT: 33.2 % — ABNORMAL LOW (ref 39.0–52.0)
Hemoglobin: 11.4 g/dL — ABNORMAL LOW (ref 13.0–17.0)
MCH: 29.6 pg (ref 26.0–34.0)
MCHC: 34.3 g/dL (ref 30.0–36.0)
MCV: 86.2 fL (ref 78.0–100.0)
Platelets: 136 10*3/uL — ABNORMAL LOW (ref 150–400)
RBC: 3.85 MIL/uL — ABNORMAL LOW (ref 4.22–5.81)
RDW: 15.8 % — ABNORMAL HIGH (ref 11.5–15.5)
WBC: 13.8 10*3/uL — ABNORMAL HIGH (ref 4.0–10.5)

## 2010-05-18 LAB — MAGNESIUM: Magnesium: 2.6 mg/dL — ABNORMAL HIGH (ref 1.5–2.5)

## 2010-05-19 ENCOUNTER — Inpatient Hospital Stay (HOSPITAL_COMMUNITY): Payer: BC Managed Care – PPO

## 2010-05-19 LAB — GLUCOSE, CAPILLARY
Glucose-Capillary: 137 mg/dL — ABNORMAL HIGH (ref 70–99)
Glucose-Capillary: 124 mg/dL — ABNORMAL HIGH (ref 70–99)
Glucose-Capillary: 107 mg/dL — ABNORMAL HIGH (ref 70–99)
Glucose-Capillary: 126 mg/dL — ABNORMAL HIGH (ref 70–99)

## 2010-05-19 LAB — CBC
HCT: 31.9 % — ABNORMAL LOW (ref 39.0–52.0)
Hemoglobin: 11.2 g/dL — ABNORMAL LOW (ref 13.0–17.0)
MCH: 30.7 pg (ref 26.0–34.0)
MCHC: 35.1 g/dL (ref 30.0–36.0)
MCV: 87.4 fL (ref 78.0–100.0)
Platelets: 119 10*3/uL — ABNORMAL LOW (ref 150–400)
RBC: 3.65 MIL/uL — ABNORMAL LOW (ref 4.22–5.81)
RDW: 16.1 % — ABNORMAL HIGH (ref 11.5–15.5)
WBC: 15.8 10*3/uL — ABNORMAL HIGH (ref 4.0–10.5)

## 2010-05-19 LAB — BASIC METABOLIC PANEL
BUN: 21 mg/dL (ref 6–23)
CO2: 27 mEq/L (ref 19–32)
Calcium: 8.2 mg/dL — ABNORMAL LOW (ref 8.4–10.5)
Chloride: 105 mEq/L (ref 96–112)
Creatinine, Ser: 0.94 mg/dL (ref 0.4–1.5)
GFR calc Af Amer: >60 mL/min (ref >60)
GFR calc non Af Amer: >60 mL/min (ref >60)
Glucose, Bld: 144 mg/dL — ABNORMAL HIGH (ref 70–99)
Potassium: 4.1 mEq/L (ref 3.5–5.1)
Sodium: 139 mEq/L (ref 135–145)

## 2010-05-19 LAB — HEMOGLOBIN A1C
Hgb A1c MFr Bld: 5.9 % — ABNORMAL HIGH (ref <5.7)
Mean Plasma Glucose: 123 mg/dL — ABNORMAL HIGH (ref <117)

## 2010-05-20 ENCOUNTER — Inpatient Hospital Stay: Admit: 2010-05-20 | Payer: Self-pay

## 2010-05-20 LAB — GLUCOSE, CAPILLARY
Glucose-Capillary: 128 mg/dL — ABNORMAL HIGH (ref 70–99)
Glucose-Capillary: 107 mg/dL — ABNORMAL HIGH (ref 70–99)
Glucose-Capillary: 106 mg/dL — ABNORMAL HIGH (ref 70–99)

## 2010-05-20 LAB — CBC
HCT: 31.3 % — ABNORMAL LOW (ref 39.0–52.0)
Hemoglobin: 10.6 g/dL — ABNORMAL LOW (ref 13.0–17.0)
MCH: 30.1 pg (ref 26.0–34.0)
MCHC: 33.9 g/dL (ref 30.0–36.0)
MCV: 88.9 fL (ref 78.0–100.0)
Platelets: 144 10*3/uL — ABNORMAL LOW (ref 150–400)
RBC: 3.52 MIL/uL — ABNORMAL LOW (ref 4.22–5.81)
RDW: 16 % — ABNORMAL HIGH (ref 11.5–15.5)
WBC: 12.3 10*3/uL — ABNORMAL HIGH (ref 4.0–10.5)

## 2010-05-20 LAB — BASIC METABOLIC PANEL
BUN: 18 mg/dL (ref 6–23)
CO2: 27 mEq/L (ref 19–32)
Calcium: 8.4 mg/dL (ref 8.4–10.5)
Chloride: 103 mEq/L (ref 96–112)
Creatinine, Ser: 0.82 mg/dL (ref 0.4–1.5)
GFR calc Af Amer: >60 mL/min (ref >60)
GFR calc non Af Amer: >60 mL/min (ref >60)
Glucose, Bld: 156 mg/dL — ABNORMAL HIGH (ref 70–99)
Potassium: 4.3 mEq/L (ref 3.5–5.1)
Sodium: 137 mEq/L (ref 135–145)

## 2010-05-20 NOTE — Op Note (Signed)
NAME:  [name], [name] NO.:  [id number]  MEDICAL RECORD NO.:  [id number]           PATIENT TYPE:  I  LOCATION:  2015                         FACILITY:  [facility]  PHYSICIAN:  Sheliah Plane, MD    DATE OF BIRTH:  [birth year]  DATE OF PROCEDURE:  05/17/2010 DATE OF DISCHARGE:                              OPERATIVE REPORT   PREOPERATIVE DIAGNOSIS:  Coronary occlusive disease with left main obstruction.  POSTOPERATIVE DIAGNOSIS:  Coronary occlusive disease with left main obstruction.  SURGICAL PROCEDURES:  Coronary artery bypass grafting x5 with left internal mammary to the left anterior descending coronary artery. Reverse saphenous vein graft to the intermediate coronary artery. Reverse saphenous vein graft to the circumflex coronary artery and sequential reverse saphenous vein graft to the posterior descending and posterior lateral branches of the right coronary artery with endovein harvesting from the right thigh and calf.SURGEON:  Sheliah Plane, MD.  FIRST ASSISTANT:  [name], [name].  BRIEF HISTORY:  The patient is a 65 year old male, who has been followed by Dr. [name] for coronary artery disease.  Two years previously, he had undergone coronary artery angiography and was found to have a degree of left main disease, and at that time had seen Dr. [name] and coronary artery bypass grafting was recommended.  The patient initially declined and was continued to be treated medically.  In the fall of 2011, there was a question of a left hypermetabolic left lung lesion. At that time, he underwent repeat evaluation for coronary artery disease and was found to have progression of his coronary disease.  Followup needle biopsy of the lung lesion did not reveal any malignancy.  The patient had been followed by Dr. [name] and followup CT scan in March of this year, showed marked decrease in size, almost resolution of the area in question.  Because of increasing  symptoms of angina, he was again referred by Dr. [name] to consider coronary artery bypass grafting. After the review of the patient's older films and new films, again coronary artery bypass grafting was recommended because of left main disease and progression of his other native disease.  Risks and options were discussed with the the patient in detail and he was willing to proceed.  DESCRIPTION OF PROCEDURE:  With Swan-Ganz and arterial line monitors were in place, the patient underwent general endotracheal anesthesia without incident.  Skin of the chest and legs was prepped with Betadine and draped in the usual sterile manner.  Using Guidant endovein harvesting system, vein was harvested from the right thigh and calf and was of good quality and caliber.  Median sternotomy was performed.  The left internal mammary artery was dissected down as pedicle graft.  The distal artery was divided and hydrostatically dilated with heparinized saline.  The pericardium was opened.  Overall ventricular function appeared preserved.  The patient was systemically heparinized.  The ascending aorta was cannulated.  Aortic root vent cardioplegia needle and right atrium were cannulated.  He was placed on cardiopulmonary bypass 2.4 liters per minute per meter square.  Sites of anastomosis were selected and dissected out of the epicardium.  The patient's body temperature was cooled to 32 degrees.  Aortic crossclamp was applied and 600 mL of cold blood potassium cardioplegia was administered with diastolic arrest of heart.  Myocardial septal temperature was monitored throughout the crossclamp.  Attention was turned first to the posterior descending, posterior lateral branches of the right coronary artery, there was 90% lesion in the proximal posterior descending and also disease in the right coronary artery.  Using a diamond type side-to-side anastomosis, second reverse saphenous vein graft was anastomosed to  the posterior descending coronary artery.  Distal extend of the same vein was then carried to the slightly smaller posterior lateral branch of the right coronary artery.  This vessel was opened, and using a running 7-0 Prolene, distal anastomosis was performed.  Attention was then turned to the lateral wall.  The circumflex coronary artery was a small nondominant vessel that bifurcated and the larger of the distal circumflex was opened.  It was relatively small, but did admit was approximately 1.2-1.3 mm in size.  Using a running 7-0 Prolene, a distal anastomosis was performed with second reverse saphenous vein graft.  A much larger intermediate vessel that was the primary supplier of the lateral wall was opened and admitted a 1.5-mm probe using running.  This vessel was partially intramyocardial.  Using a running 7-0 Prolene, a distal anastomosis was performed.  Attention was then turned to the left anterior descending coronary artery, which was opened and had some distal disease, but admitted a 1.5-mm probe proximally and distally. Using a running 8-0 Prolene, left internal mammary artery was anastomosed to left anterior descending coronary artery.  With the crossclamp still in place, three punch aortotomies were performed and each of the three vein grafts were anastomosed to the ascending aorta. The heart was allowed to passively fill and de-aired.  Bulldog on the mammary artery was removed and was rise in myocardial septal temperature.  The proximal anastomoses were completed and aortic crossclamp removed with a total crossclamp time of 100 minutes.  The patient spontaneously converted to a sinus rhythm.  Sites of anastomosis were inspected and free of bleeding.  The patient was then ventilated and weaned from cardiopulmonary bypass without difficulty and without pressure.  He remained hemodynamically stable.  He was decannulated in usual fashion.  Protamine sulfate was administered  with operative field hemostatic.  Atrial and ventricular pacing wires were applied.  Graft markers were applied.  The left pleural tube and Blake mediastinal drain were left in the mediastinum.  Pericardium was reapproximated.  Sternum was closed with #6 stainless steel wire.  Fascia closed with interrupted 0 Vicryl, running 3-0 Vicryl in subcutaneous tissue, and 4-0 subcuticular stitch in skin edges.  Dry dressings were applied.  Sponge and needle count was reported as correct at completion of the procedure. The patient tolerated the procedure without obvious complication and transferred to Surgical intensive care unit for further postoperative care.  Total pump time was 144 minutes.     Sheliah Plane, MD     EG/MEDQ  D:  05/18/2010  T:  05/19/2010  Job:  [id number]  cc:   Lyn Records, M.D.  Electronically Signed by Sheliah Plane MD on 05/20/2010 04:50:15 PM

## 2010-05-21 LAB — GLUCOSE, CAPILLARY
Glucose-Capillary: 141 mg/dL — ABNORMAL HIGH (ref 70–99)
Glucose-Capillary: 106 mg/dL — ABNORMAL HIGH (ref 70–99)
Glucose-Capillary: 128 mg/dL — ABNORMAL HIGH (ref 70–99)
Glucose-Capillary: 96 mg/dL (ref 70–99)

## 2010-05-22 LAB — GLUCOSE, CAPILLARY
Glucose-Capillary: 126 mg/dL — ABNORMAL HIGH (ref 70–99)
Glucose-Capillary: 99 mg/dL (ref 70–99)

## 2010-05-30 NOTE — Discharge Summary (Signed)
NAME:  [name], [name] NO.:  [id number]  MEDICAL RECORD NO.:  [id number]           PATIENT TYPE:  I  LOCATION:  2037                         FACILITY:  [facility]  PHYSICIAN:  Sheliah Plane, MD    DATE OF BIRTH:  July 22, 1945  DATE OF ADMISSION:  05/17/2010 DATE OF DISCHARGE:  05/21/2010                              DISCHARGE SUMMARY   HISTORY:  The patient is a 65 year old gentleman who was originally seen by Dr. [name] 2 years ago and was recommended coronary artery bypass grafting for left main obstruction.  At that time, the patient was not interested in proceeding with surgery and was treated medically.  In the meantime, he also developed a lesion in the left lung and underwent a needle biopsy which was found to be inflammatory disease and subsequent serial CT scans have shown the mass to be slowly decreasing in size. The patient continues to have angina, using nitroglycerin 2-3 times a month.  While being evaluated for potential need of lung resection, he underwent cardiac catheterization in November 2011 and at that time, was found to have at least a 70% left main and a 90% lesion in the right coronary with a small nondominant circumflex system.  He was then seen in second opinion by Dr. Sheliah Plane who evaluated the patient and his studies and agreed with recommendations to proceed with surgical revascularization.  He was admitted this hospitalization for the procedure.  The patient has no previous history of myocardial infarction.  He does have a previous bare-metal stent placed in the LAD in 2000 and did have subsequent in-stent stenosis.  Cardiac risk factors include hypertension, hyperlipidemia, remote tobacco abuse, having quit in 1981.  PAST MEDICAL HISTORY:  Includes coronary artery disease as described above, history of sleep apnea, history of hypertension, history of hypercholesterolemia, history of multinodular goiter, history of  colon polyps.  PAST SURGICAL HISTORY:  Includes left Achilles heel tendon repair.  SOCIAL HISTORY:  He is a former smoker.  As described, he drinks 2-3 beers a day.  He is employed in [employer]iological [employer].  He is married and has 4 children.  FAMILY HISTORY:  His father died of a myocardial infarction in his 23s and his mother died of congestive heart failure and diabetes.  MEDICATIONS PRIOR TO ADMISSION: 1. Toprol-XL 100 mg daily. 2. Imdur 120 mg daily. 3. Nitroglycerin p.r.n. 4. Vicodin p.r.n. for back pain. 5. Flexeril t.i.d. p.r.n. 6. Omeprazole 20 mg daily. 7. Sertraline 100 mg 1 and 1/2 tablets daily. 8. Vitamin D daily. 9. Aspirin 325 mg daily. 10.Crestor 40 mg daily. 11.Fish oil 300 mg daily. 12.Folic acid 400 mg daily. 13.Vitamin B12 supplement. 14.Vitamin E supplement. 15.Calcium supplement. 16.Coenzyme Q10 supplement.  ALLERGIES:  No known drug allergies.  REVIEW OF SYMPTOMS AND PHYSICAL EXAMINATION:  Please see the history and physical done at the time of admission.  HOSPITAL COURSE:  The patient was admitted electively and on May 17, 2010, he was taken to the operating room where he underwent the following procedure.  Coronary artery bypass grafting x5 with a left internal mammary artery to the left  anterior descending coronary artery. Next, he underwent a reversed saphenous vein graft to the intermediate coronary artery.  Then, a reverse saphenous vein graft to the circumflex coronary artery with sequential reverse saphenous vein graft to the posterior descending and posterolateral branches of the right coronary artery.  The patient had endoscopic vein harvesting from the right leg. He tolerated the procedure well and was taken to the Surgical Intensive Care Unit in stable condition.  POSTOPERATIVE HOSPITAL COURSE:  The patient has done quite well.  He has maintained stable hemodynamics.  He was weaned from the ventilator without difficulty.  He has remained  neurologically intact.  He was weaned from an inotropic support without difficulty.  All routine lines, monitors, and drainage devices have been discontinued in a standard fashion.  He does have a mild acute blood loss anemia.  His values have stabilized.  His most recent hemoglobin and hematocrit dated May 20, 2010, were 10.6 and 31.3 respectively.  Electrolytes, BUN, and creatinine were within normal limits.  Most recent hemoglobin A1c is 5.9.  Capillary blood glucoses were monitored closely and treated with routine protocols.  Incisions were healing well without evidence of infection.  Oxygen has been weaned and he maintains good saturations on room air.  He is tolerating routine advancement and activities using standard cardiac surgical rehab modalities.  He does have a mild-to- moderate volume overload which is responding well to diuretics.  He will continue for short term as an outpatient on Lasix.  Currently, his status is felt to be stable for tentative discharge in the morning of May 21, 2010, pending morning reevaluation.  Additional note is that he has not had any significant postoperative cardiac dysrhythmias.MEDICATIONS ON DISCHARGE:  At the time of this dictation include the following: 1. Aspirin enteric-coated 325 mg 1 daily. 2. Lasix 40 mg daily for an additional 5 days. 3. Mucinex p.r.n. 4. Metoprolol 12.5 mg twice daily. 5. Oxycodone 5 mg 1-2 every 4-6 hours as needed for pain. 6. Potassium chloride 20 mEq daily for an additional 5 days. 7. Crestor 40 mg p.o. daily. 8. Coenzyme Q10 one capsule daily. 9. He is also to continue his omega-3 300 mg daily. 10.Folic acid 80 mcg daily. 11.Omeprazole 20 mg p.o. daily. 12.Sertraline 100 mg 1.5 tablets daily. 13.Over-the-counter Tums 1 tablet at bedtime as previously. 14.He is also to continue his vitamin supplements including B12,     vitamin D, and vitamin D as taken previously.  INSTRUCTIONS:  The patient will receive  written instructions regarding medications, activity, diet, wound care, and followup.  Followup include Dr. Tyrone Sage on Jun 09, 2010, at 11 a.m. with a chest x-ray. Additionally, he is instructed to follow up with his cardiologist, Dr. [name], in 2 weeks.  FINAL DIAGNOSIS:  Severe left main and right coronary artery disease as described.  OTHER DIAGNOSES: 1. Postoperative acute blood loss anemia. 2. Postoperative volume overload. 3. History of hypertension. 4. History of hypercholesterolemia. 5. History of obstructive sleep apnea. 6. History of multinodular goiter. 7. History of colon polyps. 8. History of left Achilles tendon repair. 9. History of previous tobacco abuse, quit smoking in 1981. 10.History of daily alcohol use.     Rowe Clack, P.A.-C.   ______________________________ Sheliah Plane, MD    WEG/MEDQ  D:  05/20/2010  T:  05/21/2010  Job:  [id number]  cc:   Lyn Records, M.D. Robert L. Foy Guadalajara, M.D.  Electronically Signed by Deniece Portela GOLD P.A.-C. on 05/24/2010 01:43:07 PM Electronically Signed by Ramon Dredge  [email or web address] MD on 05/30/2010 12:24:05 PM

## 2010-06-07 NOTE — Letter (Signed)
January 19, 2010   Lyn Records, MD  [address]  [address]  [place], [place] [address]   Re:  TALON, REGALA                 DOB:  25-Jun-1945   I saw the patient back in the office today.  His cultures were all  negative and chest x-ray shows that this is probably look like there  might be some improvement in the left lower lobe granulomatous process.  He has had no symptoms.  He does have some occasional back pain in that  area where he had his biopsy done.  I did not feel any evidence of  hematoma or anything else .  His blood pressure was 125/77, pulse 70,  respirations 18, sats were 96%.  Plan to see him back again in 2 months  now and repeat a CT scan.   Ines Bloomer, M.D.  Electronically Signed   DPB/MEDQ  D:  01/19/2010  T:  01/19/2010  Job:  [id number]

## 2010-06-07 NOTE — Letter (Signed)
November 09, 2009   Robert L. Foy Guadalajara, MD  [address]  [place], [place]  [address]   Re:  [name], [name]                   DOB:  August 05, 1945   Dear Dr. Azucena Kuba:   I saw the patient.  This is a 65 year old male who was seen in the  hospital with chest pain.  He is known to have cardiac artery disease  and followup with partner Dr. [name] several years ago and which he had  a 60% left main with distal disease and had bypass was recommended.  Unfortunately, he did not want to have surgery.  He has been treated  medically since then and on a recent admission to the hospital, he was  found to have a left lower lobe nodule, which was abutting the  descending aorta.  PET scan was positive with a standard uptake value of  19.  There was approximately 2 x 2.5 x 3.8 cm.  There were some question  of some calcifications.   MEDICATIONS:  Toprol 100 mg daily, Imdur 120 mg daily, nitroglycerin,  Vicodin, Flexeril, omeprazole, sertraline, vitamin D, aspirin,  amoxicillin,  Crestor, fish oil, folic acid, vitamin B12, vitamin E,  Tums, and COQ10.   ALLERGIES:  He has no allergies.   He has hypercholesterolemia, hypertension and coronary artery disease.  Coronary artery disease, low back pain, depressive disorder, esophageal  reflux, cold intolerance, colonic polyps.  He recently had a polypectomy  with colonoscopy and had some bleeding, which caused the CT scan at  first initiate finding this lesion.   REVIEW OF SYSTEMS:  CARDIAC:  See history of present illness.  PULMONARY:  No hemoptysis.  No shortness of breath.  GI:  See history of present illness and past medical history.  GU:  No kidney disease, dysuria or frequent urination.  VASCULAR:  No claudication, DVT, TIAs.  NEUROLOGIC:  No dizziness, headaches, blackouts, seizures.  MUSCULOSKELETAL:  No arthritis.  EYE/ENT:  No changes in his eyesight or hearing.  HEMATOLOGIC:  No problems with bleeding, clotting disorders or anemia.   PHYSICAL  EXAMINATION:  General: He is an obese Caucasian male in no  acute distress.  Vital Signs:  His blood pressure is 128/78, pulse 60,  respirations 16, sats were 96%.  Head, Eyes, Ears, Nose and Throat:  Unremarkable.  Chest:  Clear to auscultation and percussion.  Heart:  Regular sinus rhythm.  No murmurs.  NECK:  Supple without thyromegaly.  Abdomen:  Obese.  Bowel sounds are normal.  Pulses are 2+.  There is no  clubbing or edema.  Neurologic:  He is oriented x3.  Sensory and motor  intact.   I think it is a very complicated problem given his coronary artery  disease.  I plan to discuss this with Dr. [name] to see if he needs  another cancerization.  Also I feel we probably should get a definite  diagnosis on the patient given his complicated situation, so I have  elected him to have a needle biopsy as well as pulmonary function test.  He had a brain scan done 2 months ago.  So I think we will forego that  for the present time.  I will see him back again in and after the above  test.   Ines Bloomer, M.D.  Electronically Signed   DPB/MEDQ  D:  11/09/2009  T:  11/10/2009  Job:  [id number]  cc:   Lyn Records, M.D.

## 2010-06-07 NOTE — Letter (Signed)
December 01, 2009   Robert L. Foy Gu[place]ajara, M.D.  [address]  [place], [place] [address]   Re:  [name], [name]                 DOB:  Jun 02, 1945   Dear Nadine Counts,   I saw the patient in the office today.  He underwent cardiac  catheterization by Dr. [name] and he still had triple vessel disease  which I think coronary bypass is probably his best option.  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  It was negative for AFB and fungus stains  and we are awaiting cultures.  It may be a good idea since this is a  necrotizing granulomatous process to put a TB skin test on him just to  make sure he is not a converter.  They did not see any AFB but that is  always a good thing to do.  I will plan to see him back again in 6 weeks  with a chest x-ray to look at his left lower lobe lesion.  Right now I  recommend no surgery at the present time given this is a granulomatous  process and hopefully this will not get any worse.  I may have to end up  doing a bronchoscopy on him for further cultures in 6 weeks.  His blood  pressure is 142/84, pulse 60, respirations 18, saturations were 94%.  His daughter is getting married this weekend.   Ines Bloomer, M.D.  Electronically Signed   DPB/MEDQ  D:  12/01/2009  T:  12/02/2009  Job:  [id number]

## 2010-06-07 NOTE — Cardiovascular Report (Signed)
NAME:  [name], [name] NO.:  [id number]   MEDICAL RECORD NO.:  [id number]          PATIENT TYPE:  OUT   LOCATION:  VASC                         FACILITY:  [facility]   PHYSICIAN:  Lyn Records, M.D.   DATE OF BIRTH:  1945-03-18   DATE OF PROCEDURE:  01/14/2008  DATE OF DISCHARGE:                            CARDIAC CATHETERIZATION   INDICATIONS:  Progressive angina with recent abnormal Cardiolite study  demonstrating anterolateral and lateral wall ischemia.   PROCEDURES PERFORMED:  1. Left heart catheterization.  2. Selective coronary angiography.  3. Left ventriculography.   DESCRIPTION:  After informed consent, a 4-French sheath was placed in  the right femoral artery using modified Seldinger technique.  A 4-French  A2 multipurpose catheter was then used for hemodynamic recordings, left  ventriculography by hand injection, and selective right coronary  angiography.  A #4 4-French left Judkins catheter was used for left  coronary angiography.  No complications occurred.   RESULTS:  1. Hemodynamic data:      a.     Aortic pressure 114/74.      b.     Left ventricular pressure 115/59 mmHg.  2. Left ventriculography:  Normal.  EF 65%.  3. Coronary angiography:      a.     Left main coronary:  50% distal left main before it       trifurcates into a small circumflex, ramus, and LAD.  The range of       stenosis in this region is between 40 and 60% depending upon the       view.      b.     Left anterior descending coronary:  LAD is a large vessel       that wraps around the left ventricular apex.  A large first and a       smaller second diagonal branch totally occluded.  Both were jailed       by prior proximal LAD stent.  There is mild-to-moderate in-stent       restenosis, but with less than or equal to 50% narrowing.  The       midvessel also contains 50% narrowing.  The first and second       diagonals filled by right- to-left and by left-to-left  collaterals.      c.     Circumflex artery:  The circumflex coronary artery is small.       It branches into 2 small obtuse marginals.  There is eccentric 60-       70% narrowing.      d.     Ramus intermedius branch:  There is an ostial 60% lesion and       a 50-60% proximal stenosis.      e.     Right coronary:  Eccentric 70-80% mid stenosis, moderate-       sized PDA with 95% stenosis.   CONCLUSIONS:  1. Three-vessel disease with moderately severe left main involvement,      moderate left anterior descending involvement, moderate-to-severe      circumflex (small  vessel), and moderate obtuse marginal.  There is      moderately severe mid right coronary artery disease and severe      posterior descending artery disease.  The first and second diagonal      branches are totally occluded.  2. Normal left ventricular function.   PLAN:  Consider PCI of the right coronary lesions versus coronary artery  bypass grafting (this may be the best option).      Lyn Records, M.D.  Electronically Signed     HWS/MEDQ  D:  01/14/2008  T:  01/14/2008  Job:  [id number]   cc:   Molly Maduro A. Nicholos Johns, M.D.

## 2010-06-07 NOTE — Consult Note (Signed)
NEW PATIENT CONSULTATION   [name], [name]  DOB:  08/03/1945                                        January 21, 2008  CHART #:  [id number]   REFERRING PHYSICIAN:  Lyn Records III, MD   REASON FOR CONSULTATION:  Left main and 3-vessel coronary artery  disease.   CLINICAL HISTORY:  I was asked by Dr. [name] to evaluate the patient for  consideration of coronary artery bypass graft surgery.  He is a 65-year-  old gentleman with a history of coronary artery disease, status post  stenting of his proximal LAD with diagonal PTCA in 2000.  He had a bare-  metal stent at that time.  Apparently, he had moderate disease in the  LAD, left circumflex, and right coronary artery at the time of  catheterization.  Over the past several months, he has had progressive  tightness in his chest with exertion as well as at rest.  She said that  he frequently has episodes that wake him up in the morning with  heaviness and tightness across his chest that resolved after he gets off  walking around.  He has taken some nitroglycerin on occasion for this  and that also helps resolve the symptoms.  He has also had a chest  pressure and tightness related to exertion such as lifting heavy objects  or walking upstairs at work.  He has had some exertional dyspnea as well  as decreased energy level for at least the past 6-12 months.  He  underwent a stress Cardiolite exam on 12/30/2007.  This showed mild  anterolateral and lateral wall ischemia on the perfusion images with  ejection fraction of 67%.  There was exercise-induced ischemic EKG  changes with prolonged postexercise ST-T wave abnormality.  I eventually  recovered him about 7-8 minutes into recovery.  These changes are  similar to those that were noted on his initial stress test 9 years ago  and had resolved after stenting of his LAD on the stress test done as  recently in 2005.  He underwent cardiac catheterization on  01/14/2008,  which showed about 60% distal left main tapering stenosis.  The LAD had  a patent stent proximally, but there is some haziness to this.  There is  not a clear contrast column passing through the stent.  There is also  about 50% proximal and mid vessel stenosis.  The left circumflex was a  small vessel and had a single small marginal branch that had about 60-  70% stenosis in the main portion of the circumflex before this branch.  The right coronary artery was a large dominant vessel that has 70-80%  focal mid vessel stenosis and 95% focal stenosis at the origin of  posterior descending branch.  On the posterolateral branch, there was a  large vessel.  There was also a complete occlusion of a second diagonal  branch filling by collaterals.  There is an intermediate vessel that had  about 60% ostial stenosis.  Left ventricular function was normal.   REVIEW OF SYSTEMS:  General:  He has had no fever or chills.  He has had  no recent weight changes.  He has had fatigue for the last 6-12 months.  Eyes:  Negative.  ENT:  Negative.  Endocrine:  He denies adult-onset  diabetes.  He has had no hypothyroidism.  Cardiovascular:  As above.  He  has had chest tightness and pressure with rest as well as with exertion.  He has had exertional shortness of breath.  He denies PND and orthopnea.  He denies palpitations.  He has had no peripheral edema.  Respiratory:  He has had no cough or sputum production.  GI:  He has had no nausea or  vomiting.  He has had no dysphagia.  He denies melena and bright red  blood per rectum.  GU:  He has had no dysuria or hematuria.  Vascular:  He denies any history of claudication or phlebitis.  He has never had  DVT or vein stripping.  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  He denies any focal weakness or  numbness.  He has had  some dizziness, but denies any syncope.  Psychiatric:  He does have a  history of anxiety.  Hematological:  Negative.   ALLERGIES:  None.   MEDICATIONS:  Toprol-XL 50 mg daily, Imdur 30 mg daily, Lipitor 80 mg  daily, aspirin 325 mg daily, Nexium 40 mg daily, Zetia 10 mg daily, fish  oil 1000 mg daily, vitamin E 400 units daily, coenzyme q. 10 daily,  vitamin B12 daily, Zoloft 100 mg daily, Flexeril 10 mg p.r.n., Vicodin  p.r.n., folic acid 400 mcg daily, calcium and vitamin D supplement  daily.   PAST MEDICAL HISTORY:  Significant for coronary artery disease as  mentioned above.  He has history of hypertension and  hypercholesterolemia.  He reports history of thyroid goiter.  He had a  history of pneumonia in 28 and 1991.   FAMILY HISTORY:  His father died of a heart attack at a young age in his  4s.  His mother died of congestive heart failure.  He has a brother who  is alive and has diabetes.  His brother who is alive and had a heart  attack.   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  He quit  smoking in 1981.  He drinks about 7-10 drinks per week of alcohol.  He  has 4 children.   PHYSICAL EXAMINATION:  VITAL SIGNS:  Blood pressure 139/85, his pulse is  67 and regular.  Respiratory rate is 18 and unlabored.  Oxygen  saturation on room air is 94%.  GENERAL:  He is a well-developed white male, in no distress.  HEENT:  Normocephalic and atraumatic.  Pupils are equal and reactive to  light and accommodation.  Extraocular muscles are intact.  His throat is  clear.  NECK:  Normal carotid pulses bilaterally.  No bruits.  There is no  adenopathy or thyromegaly.  CARDIAC:  Regular rate and rhythm with normal S1 and S2.  There is no  murmur, rub, or gallop.  LUNGS:  Clear.  ABDOMEN:  Active bowel sounds.  His abdomen is soft and nontender.  There are no palpable masses or organomegaly.  EXTREMITIES:  No peripheral edema.  Pedal pulses  are palpable  bilaterally.  SKIN:  Warm and dry.  NEUROLOGIC:  Alert and oriented x3.  Motor and sensory exams are grossly  normal.   Carotid Doppler examination shows no evidence of internal carotid artery  disease.  Bilateral vertebral artery flow was antegrade.  Upper  extremity vascular  exam is normal with radial and ulnar compression.   IMPRESSION:  The patient has about 60% distal left main and 3-vessel  coronary artery disease with lifestyle-limiting symptoms and an abnormal  stress test.  He has normal left ventricular function.  I am also  concerned about flow through the proximal left anterior descending  stent, which appears somewhat hazy.  I think, his best long-term  treatment is going to be coronary artery bypass graft surgery.  He  expressed some concern about whether a left internal mammary graft would  remain patent since the left main stenosis was not high grade.  I do not  think that concern should preclude coronary artery bypass surgery in  this case.  I discussed the benefits and risks of coronary artery bypass  surgery and answered all of their questions.  They are not sure whether  they would like to proceed with coronary artery bypass surgery or would  rather continue medical therapy for a period of time with reevaluation.  They are supposed to see Dr. [name] next week and we will discuss with  him further and they will let me know whether they would like to proceed  with bypass surgery.   Evelene Croon, M.D.  Electronically Signed   BB/MEDQ  D:  01/21/2008  T:  01/21/2008  Job:  [id number]   cc:   Lyn Records, M.D.

## 2010-06-07 NOTE — Letter (Signed)
November 09, 2009   Robert L. Foy Guadalajara, MD  [address]  [place], [place]  [address]   Re:  ATA, PECHA                   DOB:  31-Jan-1945   Dear Dr. Azucena Kuba:   I saw the patient.  This is a 65 year old male who was seen in the  hospital with chest pain.  He is known to have cardiac artery disease  and followup with partner Dr. [name] several years ago and which he had  a 60% left main with distal disease and had bypass was recommended.  Unfortunately, he did not want to have surgery.  He has been treated  medically since then and on a recent admission to the hospital, he was  found to have a left lower lobe nodule, which was abutting the  descending aorta.  PET scan was positive with a standard uptake value of  19.  There was approximately 2 x 2.5 x 3.8 cm.  There were some question  of some calcifications.   MEDICATIONS:  Toprol 100 mg daily, Imdur 120 mg daily, nitroglycerin,  Vicodin, Flexeril, omeprazole, sertraline, vitamin D, aspirin,  amoxicillin,  Crestor, fish oil, folic acid, vitamin B12, vitamin E,  Tums, and COQ10.   ALLERGIES:  He has no allergies.   He has hypercholesterolemia, hypertension and coronary artery disease.  Coronary artery disease, low back pain, depressive disorder, esophageal  reflux, cold intolerance, colonic polyps.  He recently had a polypectomy  with colonoscopy and had some bleeding, which caused the CT scan at  first initiate finding this lesion.   REVIEW OF SYSTEMS:  CARDIAC:  See history of present illness.  PULMONARY:  No hemoptysis.  No shortness of breath.  GI:  See history of present illness and past medical history.  GU:  No kidney disease, dysuria or frequent urination.  VASCULAR:  No claudication, DVT, TIAs.  NEUROLOGIC:  No dizziness, headaches, blackouts, seizures.  MUSCULOSKELETAL:  No arthritis.  EYE/ENT:  No changes in his eyesight or hearing.  HEMATOLOGIC:  No problems with bleeding, clotting disorders or anemia.   PHYSICAL  EXAMINATION:  General: He is an obese Caucasian male in no  acute distress.  Vital Signs:  His blood pressure is 128/78, pulse 60,  respirations 16, sats were 96%.  Head, Eyes, Ears, Nose and Throat:  Unremarkable.  Chest:  Clear to auscultation and percussion.  Heart:  Regular sinus rhythm.  No murmurs.  NECK:  Supple without thyromegaly.  Abdomen:  Obese.  Bowel sounds are normal.  Pulses are 2+.  There is no  clubbing or edema.  Neurologic:  He is oriented x3.  Sensory and motor  intact.   I think it is a very complicated problem given his coronary artery  disease.  I plan to discuss this with Dr. [name] to see if he needs  another cancerization.  Also I feel we probably should get a definite  diagnosis on the patient given his complicated situation, so I have  elected him to have a needle biopsy as well as pulmonary function test.  He had a brain scan done 2 months ago.  So I think we will forego that  for the present time.  I will see him back again in and after the above  test.   Ines Bloomer, M.D.    DPB/MEDQ  D:  11/09/2009  T:  11/10/2009  Job:  [id number]  cc:   Lyn Records, M.D.

## 2010-06-08 ENCOUNTER — Other Ambulatory Visit: Payer: Self-pay | Admitting: Cardiothoracic Surgery

## 2010-06-08 DIAGNOSIS — I2510 Atherosclerotic heart disease of native coronary artery without angina pectoris: Secondary | ICD-10-CM

## 2010-06-09 ENCOUNTER — Ambulatory Visit (INDEPENDENT_AMBULATORY_CARE_PROVIDER_SITE_OTHER): Payer: Self-pay | Admitting: Cardiothoracic Surgery

## 2010-06-09 ENCOUNTER — Ambulatory Visit
Admission: RE | Admit: 2010-06-09 | Discharge: 2010-06-09 | Disposition: A | Discharge status: Home / Self Care | Payer: BC Managed Care – PPO | Source: Ambulatory Visit | Attending: Cardiothoracic Surgery | Admitting: Cardiothoracic Surgery

## 2010-06-09 DIAGNOSIS — I2510 Atherosclerotic heart disease of native coronary artery without angina pectoris: Secondary | ICD-10-CM

## 2010-06-09 NOTE — Assessment & Plan Note (Signed)
OFFICE VISIT  [name], NEVINS DOB:  1945/01/28                                        Jun 09, 2010 CHART #:  [id number]  DATE OF OPERATION:  May 17, 2010.  The patient returns to the office today in followup after his coronary artery bypass grafting x5 for left main obstruction.  He also has a history of a small lung lesion that was biopsied in the fall of 2011, that was a necrotizing granuloma, has been shrinking in size and has been followed by Dr. [name].  The patient has made good progress. Postoperatively, he has some mild dysesthesias associated with the use of the mammary artery, but otherwise his incisions are healing well.  He has had no fever, chills.  He has had no recurrent angina.  On exam, his blood pressure is 124/85, pulse is 80, respiratory rate is 18, O2 sats 96%.  Sternum is stable and well healed.  Lungs are clear bilaterally.  Cardiac exam reveals regular rate and rhythm.  Do not appreciate any murmur.  Lower extremities, has very mild edema in the right leg, not in the left.  The endovein harvest sites are all healing well.  Follow up chest x-ray shows clear lung fields bilaterally.  The patient continues on: 1. Aspirin 325 a day. 2. Metoprolol 12.5 b.i.d.  When he runs out of the metoprolol, he will     switch back to Toprol once a day. 3. Crestor 40 mg a day. 4. CoQ10. 5. Fish oil. 6. Folic acid. 7. Omeprazole. 8. Sertraline 50 mg a day. 9. Vitamin E 1000 units.  Overall, I am very pleased with the patient's progress.  He will return to work on July 06, 2010, suggested to him not to do any long distance traveling for until 3 months postop, and no heavy lifting for 3 months. He is encouraged to enroll in the cardiac rehab program which he plans to do.  I have made him a repeat return appointment to see me in August or September with a followup CT scan to check on the status of pulmonary nodule that was previously  biopsied.  Sheliah Plane, MD Electronically Signed  EG/MEDQ  D:  06/09/2010  T:  06/09/2010  Job:  [id number]  cc:   Molly Maduro L. Foy Guadalajara, M.D. Lyn Records, M.D.

## 2010-06-10 NOTE — Cardiovascular Report (Signed)
[place]  Patient:    [name], [name]                        MRN: [id number] Proc. Date: 05/03/00 Adm. Date:  [id number] Attending:  Lyn Records. Iii CC:         Marinda Elk, M.D.  [facility]  Cardiac Catheterization Laboratory   Cardiac Catheterization  INDICATIONS:  Mr. Clingerman is 65 years of age and has a history of coronary disease.  He is status post stent to the LAD in February of 2000.  He was enrolled in the REVERSAL trial in September of 2000 and is being restudied today, 18 months followup post REVERSAL trial enrollment.  He is relatively asymptomatic.  PROCEDURES PERFORMED: 1. Left heart catheterization. 2. Selective coronary angiography. 3. Left ventriculography by hand injection. 4. Intravascular ultrasound, right coronary artery using the Ultra-Cross,    IVUS catheter, Scimed. 5. Attempted Perclose arteriotomy closure.  DESCRIPTION OF PROCEDURE:  After informed consent, and using the modified Seldinger technique, a 7 French arterial sheath was placed in the right femoral artery.  We used a 6 [place] A2 multipurpose catheter for hemodynamic recordings, left ventriculography by hand injection, and attempted coronary angiography.  We then used a #4, 6 French left Judkins catheter, left coronary angiography, and a side hole right Judkins 7 French catheter for right coronary angiography.  A total of 200 mcg of intracoronary nitroglycerin was administered into the right coronary, and intravascular ultrasound of the right coronary, starting in the proximal portion of the distal segment and IVUS back with the guide catheter in the right coronary ostium was performed. No complications occurred.  A 0.014 BMW wire was used.  Following the procedure, the Perclose device was used to attempt arteriotomy closure.  No technical problems occurred.  However, we were never able to get the artery to stop  bleeding despite what we felt was pristine Perclose device. The suture was cut with the new cutting device in the sheath track and manual pressure was held.  The patient received a total of 4000 units of heparin.  ACT was documented to be 249 seconds prior to intracoronary intervention.  RESULTS:  I:  HEMODYNAMIC DATA:     a. The aortic pressure 112/74.     b. Left ventricular pressure 112/14.  II:  LEFT VENTRICULOGRAPHY:  By hand injection the left ventricle demonstrates normal overall contractility and EF of at least 60%.  No significant mitral regurgitation is noted.  III:  SELECTIVE CORONARY ANGIOGRAPHY:     a. Left main:  The left main coronary artery is widely patent.     b. Left anterior descending coronary artery:  The LAD has multiple        luminal irregularities noted.  There is ostial 50% LAD narrowing        as it arises from the left main.  Multiple areas of luminal        irregularity are noted in the proximal and mid vessel.  The proximal        stent contains up to 30% narrowing and the large diagonal that arises        from the stented segment contains an ostial 70-80% stenosis that is        unchanged from previous angiographic appearance.     c. Circumflex artery:  The circumflex artery is relatively small in        distribution.  A very large early obtuse marginal/ramus branch contains        30-40% proximal narrowing with luminal irregularities.  The mid        circumflex between the first and second obtuse marginal contains 50-70%        narrowing within a hypolucent segment.  The second obtuse marginal        contains 60-70% proximal eccentric narrowing.  The third small obtuse        marginal branch is free of any significant obstruction.     d. Right coronary artery:  The right coronary artery is a large vessel        given a large PDA and two large left ventricular branches.  Multiple        luminal irregularities are noted proximally in the mid and in  the        distal both before and after the takeoff of the PDA.  The mid RCA        predominately between the two acute marginal branches contains a        segmental up to 50% narrowing.  IV: INTRAVASCULAR ULTRASOUND OF THE RIGHT CORONARY:  The intravascular ultrasound images were quite acceptable.  Diffuse plaquing was noted from the distal vessel to the ostium of the right coronary.  The mid segment between the two acute marginal branches, contains relatively severe circumferential plaquing with heavy calcification and at one point a 2.2 mm diameter lumen. The post IVUS angiographic image revealed no abnormalities.  The patients right coronary was pretreated with 200 mcg of intracoronary nitroglycerin prior to the IVUS run.  CONCLUSIONS: 1. Diffuse coronary atherosclerosis involving the left anterior descending,    mildly to moderately, the jailed first diagonal severely, the first and    second obtuse marginal in the proximal circumflex moderately, and the    right coronary artery mildly to moderately. 2. Successful intravascular ultrasound. 3. Normal left ventricular function. 4. Failed Perclose.  PLAN:  Manual compression.  Home later today if no complications.  IV Ancef 1 g was administered. DD:  05/03/00 TD:  05/03/00 Job: [id number]

## 2010-06-10 NOTE — H&P (Signed)
[place]  Patient:    [name], [name]                        MRN: [id number] Adm. Date:  [id number] Attending:  Lyn Records. Iii Dictator:   Anselm Lis, N.P. CC:         Marinda Elk, M.D.   History and Physical  PRIMARY CARE Margarette Vannatter:  Dr. Marinda Elk.  DATE OF BIRTH:  [birth year].  DIAGNOSES: (As dictated by Dr. Darci Needle.)  1. Coronary atherosclerotic heart disease.     a. (February 2000) Stent, left anterior descending; percutaneous        transluminal coronary angioplasty, diagonal #1.  Complicated by        pseudoaneurysm requiring overnight hospital stay for successful        compression, right femoral artery, March 19, 1998.     b. (September 2000) Followup coronary angiography revealing normal left        ventricular size and function with ejection fraction of 65%.        Mild-to-moderate in-stent left anterior descending; approximately 50%.        A large diagonal #1 with 75-80% narrowing as it arises from the stented        region.  Obtuse marginal #1 50-70%.  Circumflex in portion between the        first and second obtuse marginals contains eccentric 60-80% area        (stable from prior angiography).  Obtuse marginal #2 --        60-70% proximal.  Obtuse marginal #3 -- no significant disease.        Right coronary artery without significant obstruction (none greater        than 40%).  Of note, there was heavy intraluminal plaquing demonstrated        by intravascular ultrasound of the right coronary artery.  2. History of repair -- ruptured Achilles tendon.  3. History of low back pain.  4. History of hypertension.  5. History of ______ ; currently enrolled in REVERSAL Study trial,     randomized either to Pravachol 40 mg p.o. or Lipitor 80 mg p.o. q.d.  6. ? Symptoms of sleep apnea; has been recommended weight loss and if this     does not show improvement, will have followup office visit with     Dr.  Joni Fears D. Young.  7. History of pneumonia in 1971 and 1990.  8. Prostatitis, December 2001.  PLAN: (As dictated by Dr. [name].)  Coronary angiography as part of fulfillment of REVERSAL Study drug protocol.  Risks, potential complications, benefits and alternatives to procedure discussed in detail with [name]. The patient indicates his questions and concerns have been addressed and is agreeable to proceed.  HISTORY OF PRESENT ILLNESS:  [name] is a very pleasant 65 year old gentleman, status post stent in the LAD and PTCA of diagonal 1 back in February of 2000.  A followup coronary angiography, September of 2000, revealed mild-to-moderate in-stent LAD restenosis with significant obstruction in diagonal 1 (80%).  This branch arose from the stented segment.  Moderate disease in the circumflex and RCA.  There was heavy intraluminal plaquing demonstrated by intravascular ultrasound of the right coronary artery. Patient underwent aggressive anti-lipid therapy and was enrolled in the REVERSAL Trial; randomized either Lipitor or Pravachol.  He now presents for followup coronary angiography as part of fulfillment  of the REVERSAL Study protocol.  Patient has had little in the way of exertional chest tightness or dyspnea.  Occasional use of sublingual nitrates for heaviness with lying down but has only +/- resolved.  He does awaken in the mornings with anterior chest tightness but this resolves after he moves up and about.  It seems that this discomfort will reoccur if he lays down for longer than 30 or 40 minutes.  He had been having problems with disruption in his sleep pattern, waking up with snorting inspiration; wife mentions loud snoring and apparently an interrupted breathing pattern.  Recommendation by Dr. Fannie Knee included weight loss; followup with Dr. Maple Hudson if no improvement with this.  He may have recumbent chest tightness possibly related to reflux.  ALLERGIES:  No  known drug allergies.  No problems with seafood nor iodinated problems.  MEDICATIONS:  1. Pravachol or Lipitor as above.  2. Aspirin 325 mg once daily.  3. Wellbutrin 150 mg p.o. b.i.d.  4. Paxil 20 mg p.o. q.d.  5. Imdur 30 mg p.o. q.d.  6. Toprol-XL 50 mg p.o. q.d.  7. Vitamin E.  8. Co-Q10, which is a coenzyme, once daily.  9. B12. 10. Folic acid.  SOCIAL HISTORY AND HABITS:  Tobacco:  Quit in 1981, prior 1+ pack per day for 15 years.  ETOH:  A few beers a day.  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  He is remarried for 19 years, has 3 children and 1 stepson. Biological children are ages 30, 45, and 47.  FAMILY HISTORY:  Father deceased at age 4 of an MI.  Mother died at age 37, had diabetes and CHF.  Patient has four siblings, one of whom -- older brother -- had heart attack, age 10, this brother is also a diabetic; a 64 year old sister with a heart murmur.  REVIEW OF SYSTEMS:  As in HPI/previous medical history; otherwise, has problems with light-headedness with fast position changes.  No syncope nor near-syncopal episodes.  Has rare episodic feelings of food getting stuck with swallowing.  Negative constipation, diarrhea nor bright red blood PR. Prostatitis, December of last year, treated with antibiotics.  No current dysuria nor hematuria.  Does have "shy bladder" when undergoing stressful situations or in the hospital.  No arthritic-type complaints, though wife mentions has weak knees.  PHYSICAL EXAMINATION: (As performed by Dr. [name].)  VITAL SIGNS:  Blood pressure is 130/84, heart rate 88 and regular.  Weight 246 pounds.  Height 5 feet 11 inches.  GENERAL:  In general, he is a well-nourished middle-aged gentleman in no acute distress, except his wife has mentioned this.  HEENT/NECK:  Brisk bilateral carotid upstrokes without bruit.  No significant JVD nor thyromegaly.  CHEST:  Lung sounds clear with equal bilateral excursion.  Negative  CVA tenderness.   CARDIAC:  Regular rate and rhythm, without murmur, rub nor gallop.  Normal S1 and S1.  ABDOMEN:  Soft, obese, normoactive bowel sounds.  Negative abdominal aortic, renal nor femoral bruit.  Nontender to applied pressure.  No masses nor organomegaly appreciated though difficult exam secondary to obesity.  EXTREMITIES:  He has decreased radial pulses but they are present.  Bilateral +1 dorsalis pedis and posterior tibial pulses.  Negative pedal edema.  NEUROLOGIC:  Cranial nerves II-XII grossly intact; alert and oriented x 3.  GENITAL AND RECTAL:  Exams deferred.  LABORATORY TESTS AND DATA:  Sodium 142, potassium 4.9, chloride of 106, CO2 29, BUN of 21, creatinine 1.0  and glucose 106.  Hemoglobin 15.3, hematocrit of 43.6, WBC of 6.5 and platelets of 303,000.  Pro time of 13.4 with INR of 1.29 and PTT of 31.  Those labs are from April 30, 2000.  EKG from today revealed sinus bradycardia at 58 beats per minute with T wave inversion, lead III, and low voltage and T wave in aVF. DD:  05/03/00 TD:  05/03/00 Job: [id number]

## 2010-06-22 ENCOUNTER — Encounter (HOSPITAL_COMMUNITY): Payer: BC Managed Care – PPO | Attending: Interventional Cardiology

## 2010-06-22 DIAGNOSIS — E785 Hyperlipidemia, unspecified: Secondary | ICD-10-CM | POA: Insufficient documentation

## 2010-06-22 DIAGNOSIS — Z951 Presence of aortocoronary bypass graft: Secondary | ICD-10-CM | POA: Insufficient documentation

## 2010-06-22 DIAGNOSIS — I10 Essential (primary) hypertension: Secondary | ICD-10-CM | POA: Insufficient documentation

## 2010-06-22 DIAGNOSIS — I2510 Atherosclerotic heart disease of native coronary artery without angina pectoris: Secondary | ICD-10-CM | POA: Insufficient documentation

## 2010-06-22 DIAGNOSIS — Z7982 Long term (current) use of aspirin: Secondary | ICD-10-CM | POA: Insufficient documentation

## 2010-06-22 DIAGNOSIS — Z87891 Personal history of nicotine dependence: Secondary | ICD-10-CM | POA: Insufficient documentation

## 2010-06-22 DIAGNOSIS — I209 Angina pectoris, unspecified: Secondary | ICD-10-CM | POA: Insufficient documentation

## 2010-06-22 DIAGNOSIS — Z9861 Coronary angioplasty status: Secondary | ICD-10-CM | POA: Insufficient documentation

## 2010-06-22 DIAGNOSIS — Z5189 Encounter for other specified aftercare: Secondary | ICD-10-CM | POA: Insufficient documentation

## 2010-06-24 ENCOUNTER — Encounter (HOSPITAL_COMMUNITY): Payer: BC Managed Care – PPO | Attending: Interventional Cardiology

## 2010-06-24 DIAGNOSIS — Z9861 Coronary angioplasty status: Secondary | ICD-10-CM | POA: Insufficient documentation

## 2010-06-24 DIAGNOSIS — Z87891 Personal history of nicotine dependence: Secondary | ICD-10-CM | POA: Insufficient documentation

## 2010-06-24 DIAGNOSIS — I10 Essential (primary) hypertension: Secondary | ICD-10-CM | POA: Insufficient documentation

## 2010-06-24 DIAGNOSIS — I209 Angina pectoris, unspecified: Secondary | ICD-10-CM | POA: Insufficient documentation

## 2010-06-24 DIAGNOSIS — Z5189 Encounter for other specified aftercare: Secondary | ICD-10-CM | POA: Insufficient documentation

## 2010-06-24 DIAGNOSIS — Z7982 Long term (current) use of aspirin: Secondary | ICD-10-CM | POA: Insufficient documentation

## 2010-06-24 DIAGNOSIS — I2510 Atherosclerotic heart disease of native coronary artery without angina pectoris: Secondary | ICD-10-CM | POA: Insufficient documentation

## 2010-06-24 DIAGNOSIS — E785 Hyperlipidemia, unspecified: Secondary | ICD-10-CM | POA: Insufficient documentation

## 2010-06-24 DIAGNOSIS — Z951 Presence of aortocoronary bypass graft: Secondary | ICD-10-CM | POA: Insufficient documentation

## 2010-06-27 ENCOUNTER — Encounter (HOSPITAL_COMMUNITY): Payer: BC Managed Care – PPO

## 2010-06-29 ENCOUNTER — Encounter (HOSPITAL_COMMUNITY): Payer: BC Managed Care – PPO

## 2010-07-01 ENCOUNTER — Encounter (HOSPITAL_COMMUNITY): Payer: BC Managed Care – PPO

## 2010-07-04 ENCOUNTER — Encounter (HOSPITAL_COMMUNITY): Payer: BC Managed Care – PPO

## 2010-07-06 ENCOUNTER — Encounter (HOSPITAL_COMMUNITY): Payer: BC Managed Care – PPO

## 2010-07-08 ENCOUNTER — Encounter (HOSPITAL_COMMUNITY): Payer: BC Managed Care – PPO

## 2010-07-11 ENCOUNTER — Encounter (HOSPITAL_COMMUNITY): Payer: BC Managed Care – PPO

## 2010-07-13 ENCOUNTER — Encounter (HOSPITAL_COMMUNITY): Payer: BC Managed Care – PPO

## 2010-07-15 ENCOUNTER — Encounter (HOSPITAL_COMMUNITY): Payer: BC Managed Care – PPO

## 2010-07-18 ENCOUNTER — Encounter (HOSPITAL_COMMUNITY): Payer: BC Managed Care – PPO

## 2010-07-20 ENCOUNTER — Encounter (HOSPITAL_COMMUNITY): Payer: BC Managed Care – PPO

## 2010-07-22 ENCOUNTER — Encounter (HOSPITAL_COMMUNITY): Payer: BC Managed Care – PPO

## 2010-07-25 ENCOUNTER — Encounter (HOSPITAL_COMMUNITY): Payer: BC Managed Care – PPO | Attending: Interventional Cardiology

## 2010-07-25 DIAGNOSIS — Z9861 Coronary angioplasty status: Secondary | ICD-10-CM | POA: Insufficient documentation

## 2010-07-25 DIAGNOSIS — Z951 Presence of aortocoronary bypass graft: Secondary | ICD-10-CM | POA: Insufficient documentation

## 2010-07-25 DIAGNOSIS — Z5189 Encounter for other specified aftercare: Secondary | ICD-10-CM | POA: Insufficient documentation

## 2010-07-25 DIAGNOSIS — I209 Angina pectoris, unspecified: Secondary | ICD-10-CM | POA: Insufficient documentation

## 2010-07-25 DIAGNOSIS — Z87891 Personal history of nicotine dependence: Secondary | ICD-10-CM | POA: Insufficient documentation

## 2010-07-25 DIAGNOSIS — E785 Hyperlipidemia, unspecified: Secondary | ICD-10-CM | POA: Insufficient documentation

## 2010-07-25 DIAGNOSIS — I10 Essential (primary) hypertension: Secondary | ICD-10-CM | POA: Insufficient documentation

## 2010-07-25 DIAGNOSIS — Z7982 Long term (current) use of aspirin: Secondary | ICD-10-CM | POA: Insufficient documentation

## 2010-07-25 DIAGNOSIS — I2510 Atherosclerotic heart disease of native coronary artery without angina pectoris: Secondary | ICD-10-CM | POA: Insufficient documentation

## 2010-07-27 ENCOUNTER — Encounter (HOSPITAL_COMMUNITY): Payer: BC Managed Care – PPO

## 2010-07-29 ENCOUNTER — Encounter (HOSPITAL_COMMUNITY): Payer: BC Managed Care – PPO

## 2010-08-01 ENCOUNTER — Encounter (HOSPITAL_COMMUNITY): Payer: BC Managed Care – PPO

## 2010-08-03 ENCOUNTER — Encounter (HOSPITAL_COMMUNITY): Payer: BC Managed Care – PPO

## 2010-08-04 ENCOUNTER — Other Ambulatory Visit: Payer: Self-pay | Admitting: Cardiothoracic Surgery

## 2010-08-04 DIAGNOSIS — R911 Solitary pulmonary nodule: Secondary | ICD-10-CM

## 2010-08-05 ENCOUNTER — Encounter (HOSPITAL_COMMUNITY): Payer: BC Managed Care – PPO

## 2010-08-08 ENCOUNTER — Encounter (HOSPITAL_COMMUNITY): Payer: BC Managed Care – PPO

## 2010-08-10 ENCOUNTER — Encounter (HOSPITAL_COMMUNITY): Payer: BC Managed Care – PPO

## 2010-08-12 ENCOUNTER — Encounter (HOSPITAL_COMMUNITY): Payer: BC Managed Care – PPO

## 2010-08-15 ENCOUNTER — Encounter (HOSPITAL_COMMUNITY): Payer: BC Managed Care – PPO

## 2010-08-17 ENCOUNTER — Encounter (HOSPITAL_COMMUNITY): Payer: BC Managed Care – PPO

## 2010-08-19 ENCOUNTER — Encounter (HOSPITAL_COMMUNITY): Payer: BC Managed Care – PPO

## 2010-08-22 ENCOUNTER — Encounter (HOSPITAL_COMMUNITY): Payer: BC Managed Care – PPO

## 2010-08-24 ENCOUNTER — Encounter (HOSPITAL_COMMUNITY): Payer: BC Managed Care – PPO

## 2010-08-24 ENCOUNTER — Encounter (HOSPITAL_COMMUNITY): Payer: BC Managed Care – PPO | Attending: Interventional Cardiology

## 2010-08-24 DIAGNOSIS — Z5189 Encounter for other specified aftercare: Secondary | ICD-10-CM | POA: Insufficient documentation

## 2010-08-24 DIAGNOSIS — Z87891 Personal history of nicotine dependence: Secondary | ICD-10-CM | POA: Insufficient documentation

## 2010-08-24 DIAGNOSIS — E785 Hyperlipidemia, unspecified: Secondary | ICD-10-CM | POA: Insufficient documentation

## 2010-08-24 DIAGNOSIS — Z9861 Coronary angioplasty status: Secondary | ICD-10-CM | POA: Insufficient documentation

## 2010-08-24 DIAGNOSIS — Z951 Presence of aortocoronary bypass graft: Secondary | ICD-10-CM | POA: Insufficient documentation

## 2010-08-24 DIAGNOSIS — Z7982 Long term (current) use of aspirin: Secondary | ICD-10-CM | POA: Insufficient documentation

## 2010-08-24 DIAGNOSIS — I2510 Atherosclerotic heart disease of native coronary artery without angina pectoris: Secondary | ICD-10-CM | POA: Insufficient documentation

## 2010-08-24 DIAGNOSIS — I10 Essential (primary) hypertension: Secondary | ICD-10-CM | POA: Insufficient documentation

## 2010-08-24 DIAGNOSIS — I209 Angina pectoris, unspecified: Secondary | ICD-10-CM | POA: Insufficient documentation

## 2010-08-26 ENCOUNTER — Encounter (HOSPITAL_COMMUNITY): Payer: BC Managed Care – PPO

## 2010-08-29 ENCOUNTER — Encounter (HOSPITAL_COMMUNITY): Payer: BC Managed Care – PPO

## 2010-08-31 ENCOUNTER — Encounter (HOSPITAL_COMMUNITY): Payer: BC Managed Care – PPO

## 2010-09-02 ENCOUNTER — Encounter (HOSPITAL_COMMUNITY): Payer: BC Managed Care – PPO

## 2010-09-05 ENCOUNTER — Encounter (HOSPITAL_COMMUNITY): Payer: BC Managed Care – PPO

## 2010-09-07 ENCOUNTER — Encounter (HOSPITAL_COMMUNITY): Payer: BC Managed Care – PPO

## 2010-09-09 ENCOUNTER — Encounter (HOSPITAL_COMMUNITY): Payer: BC Managed Care – PPO

## 2010-09-12 ENCOUNTER — Encounter (HOSPITAL_COMMUNITY): Payer: BC Managed Care – PPO

## 2010-09-14 ENCOUNTER — Encounter (HOSPITAL_COMMUNITY): Payer: BC Managed Care – PPO

## 2010-09-16 ENCOUNTER — Encounter (HOSPITAL_COMMUNITY): Payer: BC Managed Care – PPO

## 2010-09-19 ENCOUNTER — Encounter (HOSPITAL_COMMUNITY): Payer: BC Managed Care – PPO

## 2010-09-21 ENCOUNTER — Encounter (HOSPITAL_COMMUNITY): Payer: BC Managed Care – PPO

## 2010-09-23 ENCOUNTER — Encounter (HOSPITAL_COMMUNITY): Payer: BC Managed Care – PPO

## 2010-09-26 ENCOUNTER — Encounter (HOSPITAL_COMMUNITY): Payer: BC Managed Care – PPO

## 2010-09-29 ENCOUNTER — Other Ambulatory Visit: Payer: BC Managed Care – PPO

## 2010-09-29 ENCOUNTER — Ambulatory Visit: Payer: BC Managed Care – PPO | Admitting: Cardiothoracic Surgery

## 2010-10-20 ENCOUNTER — Other Ambulatory Visit: Payer: BC Managed Care – PPO

## 2010-10-21 ENCOUNTER — Encounter: Payer: Self-pay | Admitting: Cardiothoracic Surgery

## 2010-10-21 DIAGNOSIS — K635 Polyp of colon: Secondary | ICD-10-CM

## 2010-10-21 DIAGNOSIS — E042 Nontoxic multinodular goiter: Secondary | ICD-10-CM

## 2010-10-21 DIAGNOSIS — E7800 Pure hypercholesterolemia, unspecified: Secondary | ICD-10-CM

## 2010-10-21 DIAGNOSIS — G4730 Sleep apnea, unspecified: Secondary | ICD-10-CM | POA: Insufficient documentation

## 2010-10-21 DIAGNOSIS — I10 Essential (primary) hypertension: Secondary | ICD-10-CM | POA: Insufficient documentation

## 2010-10-21 DIAGNOSIS — I2510 Atherosclerotic heart disease of native coronary artery without angina pectoris: Secondary | ICD-10-CM | POA: Insufficient documentation

## 2010-10-26 DIAGNOSIS — I2510 Atherosclerotic heart disease of native coronary artery without angina pectoris: Secondary | ICD-10-CM

## 2010-10-26 DIAGNOSIS — R918 Other nonspecific abnormal finding of lung field: Secondary | ICD-10-CM

## 2010-10-27 ENCOUNTER — Ambulatory Visit (INDEPENDENT_AMBULATORY_CARE_PROVIDER_SITE_OTHER): Payer: BC Managed Care – PPO | Admitting: Cardiothoracic Surgery

## 2010-10-27 ENCOUNTER — Ambulatory Visit
Admission: RE | Admit: 2010-10-27 | Discharge: 2010-10-27 | Disposition: A | Discharge status: Home / Self Care | Payer: BC Managed Care – PPO | Source: Ambulatory Visit | Attending: Cardiothoracic Surgery | Admitting: Cardiothoracic Surgery

## 2010-10-27 ENCOUNTER — Encounter: Payer: Self-pay | Admitting: Cardiothoracic Surgery

## 2010-10-27 VITALS — BP 154/94 | HR 64 | Resp 18 | Ht 71.0 in | Wt 235.0 lb

## 2010-10-27 DIAGNOSIS — Z951 Presence of aortocoronary bypass graft: Secondary | ICD-10-CM

## 2010-10-27 DIAGNOSIS — I2510 Atherosclerotic heart disease of native coronary artery without angina pectoris: Secondary | ICD-10-CM

## 2010-10-27 DIAGNOSIS — R918 Other nonspecific abnormal finding of lung field: Secondary | ICD-10-CM

## 2010-10-27 DIAGNOSIS — R911 Solitary pulmonary nodule: Secondary | ICD-10-CM

## 2010-10-27 NOTE — Patient Instructions (Addendum)
Ct scan looks ok Monitor BP at home  Come back in one year with cxr Pulmonary Nodule A pulmonary (lung) nodule is small, round growth in the lung. A nodule is about 3 centimeters (about an 1 inch) or less in size. A pulmonary nodule may be found when a chest X-ray is performed for another reason. A nodule can be classified as not cancerous (benign), cancerous (malignant) or an abnormal growth: Benign nodules. More than half of all pulmonary nodules are not cancerous. Benign nodules can be caused from things like:  Bacterial infection. This is an infection caused by germs (bacteria).   Fungal infection. This is an infection caused by a fungus (another type of germ).   Viral infection. Viruses are tiny organisms that carry disease.   Some diseases can lead to benign nodules such as:   Rheumatoid arthritis. This disease causes joint pain and swelling.   Wegener's granulomatosis. This disease causes inflammation of small blood vessels. Pulmonary nodules also may result.   Sarcoidosis. This disease can cause abnormal growths in the body, including the lungs.  Malignant nodules. These are cancerous growths.    Certain things can cause a cancerous nodule. They include:   Age. As people get older, cancerous nodules may be more likely to occur.   Cancer history. If you or one of your immediate family members has had cancer, a cancerous nodule may be more likely.   Smoking. This can include both former and current smokers.  Abnormal growths:  Abnormal growths can cause a pulmonary nodule. These can be either benign or malignant. Examples of abnormal growths include:   Hamartoma. This growth is made of many different types of tissues, such as fat, muscle, fiber and a tough, elastic tissue called cartilage.   Bronchial adenoma. This is a growth in the windpipe (trachea) or the airways in the lung (bronchi).   Chondroma. This is a growth of cartilage tissue.   Fibroma. This is a growth of  fibrous (fiber-like) tissue.   Lipoma. A lipoma is soft, fatty tissue.  DIAGNOSIS When a nodule is found, the next step is to find out if it is cancerous or non-cancerous. Your caregiver will ask about you and your family's health history. Tests will be done to help determine the type of nodule that you have. Diagnostic tests may include:  Imaging tests. A pulmonary nodule is usually found on an imaging test. These take pictures of your lungs. Types of imaging tests include:   Chest X-ray. A chest x-ray shows an image of the lung. A nodule that is big enough can be seen. Small nodules may not always show up on an x-ray image.   Computed tomography (CT) scan. This test shows smaller nodules more clearly than an x-ray.   Positron emission tomography (PET) scan. A radioactive substance is put into the blood stream through an IV. Then, the scan takes a picture of the lungs. A malignant nodule will absorb the radioactive substance faster than a benign nodule.   Procedure Tests: A biopsy may be done to help decide if the nodule is cancerous or not. A biopsy removes a tiny piece of the nodule so the tissue can be looked at under a microscope. Some of these tests include:   Bronchoscopy. A bronchoscopy uses a thin tube (a scope) with a tiny camera and light on the end. The scope is put in the windpipe. The caregiver can then see inside the lung. A tiny tool put through the scope is  used to take a small sample of the nodule tissue.   Transthoracic needle aspiration. In this procedure, a long needle is put through the chest into the lung. Sometimes a CT scan is done at the same time. A CT scan can make it easier to locate the nodule so a biopsy can be taken.   Surgical lung biopsy. This is a surgical procedure in which the nodule is removed. This is usually done when the nodule is most likely malignant or a biopsy cannot be obtained by either bronchoscopy or transthoracic needle aspiration.   Other  tests:   Blood tests. They can show if you have been exposed to a bacterial, fungal or viral infection.   Tuberculin (TB) test. This is a skin test. It can tell if you have been exposed to the germ that causes tuberculosis. Tuberculosis is a type of lung disease.  WHEN TO SEEK MEDICAL CARE: While waiting for test results to determine what type of pulmonary nodule you have, be sure to contact your caregiver if you:  Develop a cough that does not go away.   Have trouble breathing when you are active.   Feel sick or unusually tired.   Do not feel like eating.   Lose weight without trying to.   Develop chills or night sweats.   Develop a fever of more than 100.5 F (38.1 C).  SEEK [facility] MEDICAL CARE IF:  Your breathing problems become much worse.   You begin wheezing.   You cannot stop coughing.   You cough up blood.   You suddenly feel weak.   You have sudden chest pain.   You develop a fever of more than 102.0 F (38.9 C).  MAKE SURE YOU    Understand these instructions.   Will watch your condition.   Will get help right away if you are not doing well or get worse.  Document Released: 11/06/2008 Document Re-Released: 01/31/2009 [facility] Patient Information 2011 [place], [place].

## 2010-10-28 NOTE — Progress Notes (Signed)
[address]            [place] [address]          [phone]       Princess Perna Date of Birth: February 01, 1945  Lesleigh Noe, MD Lenora Boys, MD  Chief Complaint:  Chief Complaint  Patient presents with  . Lung Lesion    3 month f/u with Chest CT eval pulmonary nodules, S/P CABG x5 05/17/10    History of Present Illness: Patient returns to the office today in followup of multiple pulmonary nodules the patient had been followed by Dr. [name].  On 11/17/2009 he underwent needle biopsy of the left lower lobe lung lesion which was negative for dysplasia or malignancy and positive for necrotizing granulomatous inflammation.subsequently the patient was found to have coronary occlusive disease with left main obstruction and on 05/17/2010 underwent coronary bypass grafting x5 . The patient has been doing this well since last seen 06/09/2010. He's had no recurrent angina or evidence of congestive heart failure. He returns today with a followup CT scan of the chest to followup on the previous known pulmonary nodules.     Past Medical History  Diagnosis Date  . CAD (coronary artery disease)   . Sleep apnea   . HTN (hypertension)   . Hypercholesteremia   . Multinodular goiter   . Colon polyps   . Pulmonary nodules     Past Surgical History  Procedure Date  . Achilles tendon repair   . Coronary artery bypass grafting x5 with left 05/17/2010    History  Smoking status  . Former Smoker  Smokeless tobacco  . Not on file    History  Alcohol Use  . Yes    No Known Allergies  Current Outpatient Prescriptions  Medication Sig Dispense Refill  . aspirin 81 MG tablet Take 81 mg by mouth daily.        [name] buPROPion (WELLBUTRIN XL) 150 MG 24 hr tablet Take 150 mg by mouth every morning.       . calcium carbonate (TUMS - DOSED IN MG ELEMENTAL CALCIUM) 500 MG chewable tablet Chew 1 tablet by mouth as needed.        . calcium-vitamin D  (OSCAL WITH D) 500-200 MG-UNIT per tablet Take 1 tablet by mouth daily.       [name] co-enzyme Q-10 30 MG capsule Take 30 mg by mouth 3 (three) times daily.        [name] donepezil (ARICEPT) 10 MG tablet Take 10 mg by mouth at bedtime.       . fish oil-omega-3 fatty acids 1000 MG capsule Take 2 g by mouth daily.        . folic acid (FOLVITE) 800 MCG tablet Take 400 mcg by mouth daily.        . metoprolol (TOPROL-XL) 100 MG 24 hr tablet Take 100 mg by mouth daily.       [name] omeprazole (PRILOSEC) 20 MG capsule Take 20 mg by mouth daily.        [name] oxycodone (OXY-IR) 5 MG capsule Take 5 mg by mouth every 4 (four) hours as needed.        . rosuvastatin (CRESTOR) 40 MG tablet Take 40 mg by mouth daily.        . sertraline (ZOLOFT) 100 MG tablet Take 100 mg by mouth daily. 1  1/2 tablets (150mg ) po every day      . vitamin B-12 (CYANOCOBALAMIN) 1000 MCG tablet Take 1,000 mcg by mouth daily.        . vitamin E (VITAMIN E) 1000 UNIT capsule Take 1,000 Units by mouth daily.           family history : Patient's father died of a myocardial infarction in his 15s mother died of congestive heart failure and diabetes in her 66s.    Review of Systems: Patient denies any constitutional symptoms denies fever chills or night sweats is in no lower extremity edema palpitations syncope presyncope he denies amaurosis he's had a negative TB test he does have a history of reflux esophagitis Z. some increase difficulty in hearing has history of sleep apnea he has had a previous colonoscopy  Physical Exam:  BP 154/94  Pulse 64  Resp 18  Ht 5\' 11"  (1.803 m)  Wt 235 lb (106.595 kg)  BMI 32.78 kg/m2  SpO2 97%  Patient's awake alert and neurologically intact in no distress Neck  without palpable masses or tenderness, no jugular venous distention Chest: His lungs are clear bilaterally cardiac exam feels a regular rate and rhythm, there is no murmur Abdomen: No palpable masses or tenderness his aorta is not palpably  enlarged. Extremities: He has no pedal edema or calf tenderness 2+ DP and PT pulses    Diagnostic Studies & Laboratory data: Clinical Data: Follow up pulmonary nodules  CT CHEST WITHOUT CONTRAST  Technique: Multidetector CT imaging of the chest was performed  following the standard protocol without IV contrast.  Comparison: CT chest dated 04/05/2010 and 10/15/2009  Findings: Mild residual scarring in the region of prior medial left  lower lobe opacity (series 4/image 46). Associated left basilar  nodularity has improved. A few tiny calcified granulomas (for  example, series 4/image 50). Stable scattered pulmonary nodules  measuring up to 4 mm, including a small cluster in the right middle  lobe (series 4/images 32, 36, and 37). No pleural effusion or  pneumothorax.  Visualized thyroid is unremarkable.  The heart is normal in size. No pericardial effusion. Coronary  atherosclerosis status post stent.  No suspicious mediastinal or axillary lymphadenopathy.  Visualized upper abdomen is within normal limits.  Mild degenerative changes of the visualized thoracolumbar spine.  IMPRESSION:  Residual scarring in the medial left lower lobe. Improved  associated left lower lobe nodularity.  Scattered small pulmonary nodules measuring up to 4 mm, unchanged  from 09/2009. Per Fleischner Society guidelines, no further  dedicated follow-up is required.  This recommendation follows the consensus statement: Guidelines for  Management of Small Pulmonary Nodules Detected on CT Scans: A  Statement from the Fleischner Society as published in Radiology  2005; 237:395-400. Available online at:  [email or web address].cz.      Assessment / Plan: Patient doing well following coronary artery bypass grafting. Patient's CT scan is reviewed with him the area in the left lower lobe is now much smaller consistent with the pathologic diagnosis of inflammatory process The other  questionable nodules have not changed over a year. As noted in the radiology report further CT scans are not required for followup. I'll plan to see the patient back in one year with a followup chest x-ray is aware to contact us should he have any change in pulmonary symptoms hemoptysis or cough.        Lashya Passe B

## 2011-08-17 ENCOUNTER — Other Ambulatory Visit: Payer: Self-pay | Admitting: Neurology

## 2011-08-19 ENCOUNTER — Ambulatory Visit
Admission: RE | Admit: 2011-08-19 | Discharge: 2011-08-19 | Disposition: A | Discharge status: Home / Self Care | Payer: BC Managed Care – PPO | Source: Ambulatory Visit | Attending: Neurology | Admitting: Neurology

## 2011-10-26 ENCOUNTER — Encounter: Payer: BC Managed Care – PPO | Admitting: Cardiothoracic Surgery

## 2011-11-27 ENCOUNTER — Other Ambulatory Visit: Payer: Self-pay | Admitting: Cardiothoracic Surgery

## 2011-11-27 DIAGNOSIS — R911 Solitary pulmonary nodule: Secondary | ICD-10-CM

## 2011-11-30 ENCOUNTER — Ambulatory Visit (INDEPENDENT_AMBULATORY_CARE_PROVIDER_SITE_OTHER): Payer: BC Managed Care – PPO | Admitting: Cardiothoracic Surgery

## 2011-11-30 ENCOUNTER — Encounter: Payer: Self-pay | Admitting: Cardiothoracic Surgery

## 2011-11-30 ENCOUNTER — Ambulatory Visit
Admission: RE | Admit: 2011-11-30 | Discharge: 2011-11-30 | Disposition: A | Discharge status: Home / Self Care | Payer: BC Managed Care – PPO | Source: Ambulatory Visit | Attending: Cardiothoracic Surgery | Admitting: Cardiothoracic Surgery

## 2011-11-30 VITALS — BP 132/84 | HR 57 | Resp 16 | Ht 69.5 in | Wt 235.0 lb

## 2011-11-30 DIAGNOSIS — R911 Solitary pulmonary nodule: Secondary | ICD-10-CM

## 2011-11-30 DIAGNOSIS — Z712 Person consulting for explanation of examination or test findings: Secondary | ICD-10-CM

## 2011-11-30 DIAGNOSIS — Z7189 Other specified counseling: Secondary | ICD-10-CM

## 2011-11-30 DIAGNOSIS — J984 Other disorders of lung: Secondary | ICD-10-CM

## 2011-11-30 NOTE — Patient Instructions (Signed)
Chest xray is stable.

## 2011-11-30 NOTE — Progress Notes (Signed)
[address]            [place] [address]          [phone]      [name] [facility] Medical Record #[id number] Date of Birth: 10-12-1945  Referring: Lenora Boys, MD Primary Care: Lenora Boys, MD  Chief Complaint:    Chief Complaint  Patient presents with  . 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  SURGICAL PROCEDURES: Coronary artery bypass grafting x5 with left  internal mammary to the left anterior descending coronary artery.  Reverse saphenous vein graft to the intermediate coronary artery.  Reverse saphenous vein graft to the circumflex coronary artery and  sequential reverse saphenous vein graft to the posterior descending and  posterior lateral branches of the right coronary artery with endovein  harvesting from the right thigh and calf.SURGEON:     Prior to the patient's bypass surgery and had a needle biopsy of a lesion in the left lower lobe done which showed necrotizing granuloma, subsequent CT scans have showed this area to resolve    The patient comes in today for a followup chest x-ray , his previous known lung nodules have been stable over several years of CT scans and no further followup was recommended by radiology    Current Activity/ Functional Status: Patient is independent with mobility/ambulation, transfers, ADL's, IADL's.   Past Medical History  Diagnosis Date  . CAD (coronary artery disease)   . Sleep apnea   . HTN (hypertension)   . Hypercholesteremia   . Multinodular goiter   . Colon polyps   . Pulmonary nodules     Past Surgical History  Procedure Date  . Achilles tendon repair   . Coronary artery bypass grafting x5 with left 05/17/2010    No family  history on file.  History   Social History  . Marital Status: Married    Spouse Name: N/A    Number of Children: N/A  . Years of Education: N/A   Occupational History  . Not on file.   Social History Main Topics  . Smoking status: Former Games developer  . Smokeless tobacco: Not on file  . Alcohol Use: Yes  . Drug Use: No  . Sexually Active: Not on file   Other Topics Concern  . Not on file   Social History Narrative  . No narrative on file    History  Smoking status  . Former Smoker  Smokeless tobacco  . Not on file    History  Alcohol Use  . Yes     No Known Allergies  Current Outpatient Prescriptions  Medication Sig Dispense Refill  . aspirin 81 MG tablet Take 164 mg by mouth daily.       [name] buPROPion (WELLBUTRIN XL) 150 MG 24 hr tablet Take 150 mg by mouth every morning.       . calcium carbonate (TUMS - DOSED IN MG ELEMENTAL CALCIUM) 500 MG chewable tablet Chew 1 tablet by mouth as needed.        . calcium-vitamin D (OSCAL WITH D) 500-200 MG-UNIT per tablet Take 1 tablet by mouth daily.       [name]  co-enzyme Q-10 30 MG capsule Take 30 mg by mouth 3 (three) times daily.        [name] donepezil (ARICEPT) 10 MG tablet Take 10 mg by mouth at bedtime.       . fish oil-omega-3 fatty acids 1000 MG capsule Take 2 g by mouth daily.        . folic acid (FOLVITE) 800 MCG tablet Take 400 mcg by mouth daily.        . metoprolol (TOPROL-XL) 100 MG 24 hr tablet Take 100 mg by mouth daily.       [name] omeprazole (PRILOSEC) 20 MG capsule Take 20 mg by mouth daily.        [name] oxycodone (OXY-IR) 5 MG capsule Take 5 mg by mouth every 4 (four) hours as needed.        . rosuvastatin (CRESTOR) 40 MG tablet Take 40 mg by mouth daily.        . sertraline (ZOLOFT) 100 MG tablet Take 100 mg by mouth daily. 1 1/2 tablets (150mg ) po every day      . vitamin B-12 (CYANOCOBALAMIN) 1000 MCG tablet Take 1,000 mcg by mouth daily.        . vitamin E (VITAMIN E) 1000 UNIT capsule Take 1,000 Units by mouth daily.               Physical Exam: BP 132/84  Pulse 57  Resp 16  Ht 5' 9.5" (1.765 m)  Wt 235 lb (106.595 kg)  BMI 34.21 kg/m2  SpO2 96%  General appearance: alert and cooperative Neurologic: intact Heart: regular rate and rhythm, S1, S2 normal, no murmur, click, rub or gallop and normal apical impulse Lungs: clear to auscultation bilaterally and normal percussion bilaterally Abdomen: soft, non-tender; bowel sounds normal; no masses,  no organomegaly Extremities: extremities normal, atraumatic, no cyanosis or edema, Homans sign is negative, no sign of DVT and no edema, redness or tenderness in the calves or thighs Wound: The sternum is stable and well healed   Diagnostic Studies & Laboratory data:     Recent Radiology Findings:   Dg Chest 2 View  11/30/2011  *RADIOLOGY REPORT*  Clinical Data: Pulmonary nodule  CHEST - 2 VIEW  Comparison: 06/09/2010  Findings: Bibasilar atelectasis improved. Small nodule at the lateral left lung base is stable.  Upper normal heart size.  Mildly tortuous aorta.  Postoperative changes.  No pneumothorax.  No pleural effusion.  IMPRESSION: Improved bibasilar atelectasis. Otherwise stable.   Original Report Authenticated By: Jolaine Click, M.D.       Recent Lab Findings: Lab Results  Component Value Date   WBC 12.3* 05/20/2010   HGB 10.6* 05/20/2010   HCT 31.3* 05/20/2010   PLT 144* 05/20/2010   GLUCOSE 156* 05/20/2010   CHOL  Value: 140        ATP III CLASSIFICATION:  <200     mg/dL   Desirable  409-811  mg/dL   Borderline High  >=914    mg/dL   High        7/82/9562   TRIG 78 10/15/2009   HDL 41 10/15/2009   LDLCALC  Value: 83        Total Cholesterol/HDL:CHD Risk Coronary Heart Disease Risk Table                     Men   Women  1/2 Average Risk   3.4   3.3  Average Risk       5.0  4.4  2 X Average Risk   9.6   7.1  3 X Average Risk  23.4   11.0        Use the calculated Patient Ratio above and the CHD Risk Table to determine the patient's CHD Risk.        ATP  III CLASSIFICATION (LDL):  <100     mg/dL   Optimal  161-096  mg/dL   Near or Above                    Optimal  130-159  mg/dL   Borderline  045-409  mg/dL   High  >811     mg/dL   Very High 10/07/7827   ALT 29 05/13/2010   AST 32 05/13/2010   NA 137 05/20/2010   K 4.3 05/20/2010   CL 103 05/20/2010   CREATININE 0.82 05/20/2010   BUN 18 05/20/2010   CO2 27 05/20/2010   INR 1.34 05/17/2010   HGBA1C  Value: 5.9 (NOTE)                                                                       According to the ADA Clinical Practice Recommendations for 2011, when HbA1c is used as a screening test:   >=6.5%   Diagnostic of Diabetes Mellitus           (if abnormal result  is confirmed)  5.7-6.4%   Increased risk of developing Diabetes Mellitus  References:Diagnosis and Classification of Diabetes Mellitus,Diabetes Care,2011,34(Suppl 1):S62-S69 and Standards of Medical Care in         Diabetes - 2011,Diabetes Care,2011,34  (Suppl 1):S11-S61.* 05/19/2010      Assessment / Plan:      The patient has been stable and on review of his CAT scans and chest x-rays radiologist did not recommend any further radiographic followup. Plan to see the patient back when necessary.     Delight Ovens MD  Beeper [phone] Office [phone] 11/30/2011 1:50 PM

## 2012-02-01 DIAGNOSIS — F4323 Adjustment disorder with mixed anxiety and depressed mood: Secondary | ICD-10-CM | POA: Diagnosis not present

## 2012-02-07 DIAGNOSIS — F4323 Adjustment disorder with mixed anxiety and depressed mood: Secondary | ICD-10-CM | POA: Diagnosis not present

## 2012-02-21 DIAGNOSIS — F4323 Adjustment disorder with mixed anxiety and depressed mood: Secondary | ICD-10-CM | POA: Diagnosis not present

## 2012-03-06 DIAGNOSIS — F0280 Dementia in other diseases classified elsewhere without behavioral disturbance: Secondary | ICD-10-CM | POA: Diagnosis not present

## 2012-03-22 DIAGNOSIS — I2510 Atherosclerotic heart disease of native coronary artery without angina pectoris: Secondary | ICD-10-CM | POA: Diagnosis not present

## 2012-03-22 DIAGNOSIS — K219 Gastro-esophageal reflux disease without esophagitis: Secondary | ICD-10-CM | POA: Diagnosis not present

## 2012-03-22 DIAGNOSIS — F0280 Dementia in other diseases classified elsewhere without behavioral disturbance: Secondary | ICD-10-CM | POA: Diagnosis not present

## 2012-03-22 DIAGNOSIS — F3289 Other specified depressive episodes: Secondary | ICD-10-CM | POA: Diagnosis not present

## 2012-03-22 DIAGNOSIS — E785 Hyperlipidemia, unspecified: Secondary | ICD-10-CM | POA: Diagnosis not present

## 2012-06-19 DIAGNOSIS — H6120 Impacted cerumen, unspecified ear: Secondary | ICD-10-CM | POA: Diagnosis not present

## 2012-06-19 DIAGNOSIS — I10 Essential (primary) hypertension: Secondary | ICD-10-CM | POA: Diagnosis not present

## 2012-06-19 DIAGNOSIS — Z0000 Encounter for general adult medical examination without abnormal findings: Secondary | ICD-10-CM | POA: Diagnosis not present

## 2012-07-10 DIAGNOSIS — G309 Alzheimer's disease, unspecified: Secondary | ICD-10-CM | POA: Diagnosis not present

## 2012-09-04 ENCOUNTER — Other Ambulatory Visit: Payer: Self-pay | Admitting: Internal Medicine

## 2012-09-04 DIAGNOSIS — E042 Nontoxic multinodular goiter: Secondary | ICD-10-CM | POA: Diagnosis not present

## 2012-09-06 ENCOUNTER — Other Ambulatory Visit: Payer: BC Managed Care – PPO

## 2012-09-11 ENCOUNTER — Ambulatory Visit
Admission: RE | Admit: 2012-09-11 | Discharge: 2012-09-11 | Disposition: A | Discharge status: Home / Self Care | Payer: Medicare Other | Source: Ambulatory Visit | Attending: Internal Medicine | Admitting: Internal Medicine

## 2012-09-11 DIAGNOSIS — E042 Nontoxic multinodular goiter: Secondary | ICD-10-CM | POA: Diagnosis not present

## 2012-09-13 DIAGNOSIS — G4733 Obstructive sleep apnea (adult) (pediatric): Secondary | ICD-10-CM | POA: Diagnosis not present

## 2012-09-13 DIAGNOSIS — I10 Essential (primary) hypertension: Secondary | ICD-10-CM | POA: Diagnosis not present

## 2012-09-13 DIAGNOSIS — E669 Obesity, unspecified: Secondary | ICD-10-CM | POA: Diagnosis not present

## 2012-09-27 DIAGNOSIS — I714 Abdominal aortic aneurysm, without rupture: Secondary | ICD-10-CM | POA: Diagnosis not present

## 2012-10-15 DIAGNOSIS — I2510 Atherosclerotic heart disease of native coronary artery without angina pectoris: Secondary | ICD-10-CM | POA: Diagnosis not present

## 2012-10-15 DIAGNOSIS — I10 Essential (primary) hypertension: Secondary | ICD-10-CM | POA: Diagnosis not present

## 2012-10-15 DIAGNOSIS — E785 Hyperlipidemia, unspecified: Secondary | ICD-10-CM | POA: Diagnosis not present

## 2012-10-20 DIAGNOSIS — Z23 Encounter for immunization: Secondary | ICD-10-CM | POA: Diagnosis not present

## 2012-10-28 DIAGNOSIS — E042 Nontoxic multinodular goiter: Secondary | ICD-10-CM | POA: Diagnosis not present

## 2012-10-29 DIAGNOSIS — I10 Essential (primary) hypertension: Secondary | ICD-10-CM | POA: Diagnosis not present

## 2012-11-11 ENCOUNTER — Other Ambulatory Visit: Payer: Self-pay | Admitting: *Deleted

## 2012-11-11 DIAGNOSIS — F0280 Dementia in other diseases classified elsewhere without behavioral disturbance: Secondary | ICD-10-CM | POA: Diagnosis not present

## 2012-11-11 DIAGNOSIS — E785 Hyperlipidemia, unspecified: Secondary | ICD-10-CM

## 2012-12-17 ENCOUNTER — Other Ambulatory Visit (INDEPENDENT_AMBULATORY_CARE_PROVIDER_SITE_OTHER): Payer: Medicare Other

## 2012-12-17 DIAGNOSIS — E785 Hyperlipidemia, unspecified: Secondary | ICD-10-CM | POA: Diagnosis not present

## 2012-12-17 LAB — HEPATIC FUNCTION PANEL
ALT: 39 U/L (ref 0–53)
AST: 40 U/L — ABNORMAL HIGH (ref 0–37)
Albumin: 4 g/dL (ref 3.5–5.2)
Alkaline Phosphatase: 66 U/L (ref 39–117)

## 2012-12-17 LAB — LIPID PANEL
HDL: 35.6 mg/dL — ABNORMAL LOW (ref >39.00)
Total CHOL/HDL Ratio: 3
Triglycerides: 138 mg/dL (ref 0.0–149.0)

## 2012-12-18 ENCOUNTER — Other Ambulatory Visit: Payer: Self-pay | Admitting: Interventional Cardiology

## 2012-12-18 ENCOUNTER — Telehealth: Payer: Self-pay

## 2012-12-18 DIAGNOSIS — E785 Hyperlipidemia, unspecified: Secondary | ICD-10-CM

## 2012-12-18 NOTE — Telephone Encounter (Signed)
Message copied by Jarvis Newcomer on Wed Dec 18, 2012  1:47 PM ------      Message from: [name]      Created: Wed Dec 18, 2012 10:27 AM       Lipids and hepatic panel are at target. Repeat 1 year ------

## 2012-12-18 NOTE — Telephone Encounter (Signed)
lmom.Lipids and hepatic panel are at target. Repeat 1 year

## 2013-01-19 ENCOUNTER — Other Ambulatory Visit: Payer: Self-pay | Admitting: Interventional Cardiology

## 2013-01-22 DIAGNOSIS — M6281 Muscle weakness (generalized): Secondary | ICD-10-CM | POA: Diagnosis not present

## 2013-01-22 DIAGNOSIS — R279 Unspecified lack of coordination: Secondary | ICD-10-CM | POA: Diagnosis not present

## 2013-01-29 DIAGNOSIS — R279 Unspecified lack of coordination: Secondary | ICD-10-CM | POA: Diagnosis not present

## 2013-01-29 DIAGNOSIS — M6281 Muscle weakness (generalized): Secondary | ICD-10-CM | POA: Diagnosis not present

## 2013-01-29 DIAGNOSIS — R41841 Cognitive communication deficit: Secondary | ICD-10-CM | POA: Diagnosis not present

## 2013-01-31 DIAGNOSIS — R41841 Cognitive communication deficit: Secondary | ICD-10-CM | POA: Diagnosis not present

## 2013-01-31 DIAGNOSIS — R279 Unspecified lack of coordination: Secondary | ICD-10-CM | POA: Diagnosis not present

## 2013-01-31 DIAGNOSIS — M6281 Muscle weakness (generalized): Secondary | ICD-10-CM | POA: Diagnosis not present

## 2013-02-04 DIAGNOSIS — R279 Unspecified lack of coordination: Secondary | ICD-10-CM | POA: Diagnosis not present

## 2013-02-04 DIAGNOSIS — M6281 Muscle weakness (generalized): Secondary | ICD-10-CM | POA: Diagnosis not present

## 2013-02-04 DIAGNOSIS — R41841 Cognitive communication deficit: Secondary | ICD-10-CM | POA: Diagnosis not present

## 2013-02-06 DIAGNOSIS — R41841 Cognitive communication deficit: Secondary | ICD-10-CM | POA: Diagnosis not present

## 2013-02-06 DIAGNOSIS — M6281 Muscle weakness (generalized): Secondary | ICD-10-CM | POA: Diagnosis not present

## 2013-02-06 DIAGNOSIS — R279 Unspecified lack of coordination: Secondary | ICD-10-CM | POA: Diagnosis not present

## 2013-02-10 DIAGNOSIS — H903 Sensorineural hearing loss, bilateral: Secondary | ICD-10-CM | POA: Diagnosis not present

## 2013-02-11 DIAGNOSIS — R279 Unspecified lack of coordination: Secondary | ICD-10-CM | POA: Diagnosis not present

## 2013-02-11 DIAGNOSIS — R41841 Cognitive communication deficit: Secondary | ICD-10-CM | POA: Diagnosis not present

## 2013-02-11 DIAGNOSIS — M6281 Muscle weakness (generalized): Secondary | ICD-10-CM | POA: Diagnosis not present

## 2013-02-14 DIAGNOSIS — M6281 Muscle weakness (generalized): Secondary | ICD-10-CM | POA: Diagnosis not present

## 2013-02-14 DIAGNOSIS — R41841 Cognitive communication deficit: Secondary | ICD-10-CM | POA: Diagnosis not present

## 2013-02-14 DIAGNOSIS — R279 Unspecified lack of coordination: Secondary | ICD-10-CM | POA: Diagnosis not present

## 2013-02-17 DIAGNOSIS — R279 Unspecified lack of coordination: Secondary | ICD-10-CM | POA: Diagnosis not present

## 2013-02-17 DIAGNOSIS — M6281 Muscle weakness (generalized): Secondary | ICD-10-CM | POA: Diagnosis not present

## 2013-02-17 DIAGNOSIS — R41841 Cognitive communication deficit: Secondary | ICD-10-CM | POA: Diagnosis not present

## 2013-02-19 DIAGNOSIS — M6281 Muscle weakness (generalized): Secondary | ICD-10-CM | POA: Diagnosis not present

## 2013-02-19 DIAGNOSIS — R279 Unspecified lack of coordination: Secondary | ICD-10-CM | POA: Diagnosis not present

## 2013-02-19 DIAGNOSIS — R41841 Cognitive communication deficit: Secondary | ICD-10-CM | POA: Diagnosis not present

## 2013-02-20 DIAGNOSIS — M6281 Muscle weakness (generalized): Secondary | ICD-10-CM | POA: Diagnosis not present

## 2013-02-20 DIAGNOSIS — R279 Unspecified lack of coordination: Secondary | ICD-10-CM | POA: Diagnosis not present

## 2013-02-20 DIAGNOSIS — R41841 Cognitive communication deficit: Secondary | ICD-10-CM | POA: Diagnosis not present

## 2013-02-26 DIAGNOSIS — R41841 Cognitive communication deficit: Secondary | ICD-10-CM | POA: Diagnosis not present

## 2013-03-03 DIAGNOSIS — R41841 Cognitive communication deficit: Secondary | ICD-10-CM | POA: Diagnosis not present

## 2013-03-05 DIAGNOSIS — R41841 Cognitive communication deficit: Secondary | ICD-10-CM | POA: Diagnosis not present

## 2013-03-10 DIAGNOSIS — R41841 Cognitive communication deficit: Secondary | ICD-10-CM | POA: Diagnosis not present

## 2013-03-12 DIAGNOSIS — R41841 Cognitive communication deficit: Secondary | ICD-10-CM | POA: Diagnosis not present

## 2013-03-18 ENCOUNTER — Observation Stay (HOSPITAL_COMMUNITY)
Admission: EM | Admit: 2013-03-18 | Discharge: 2013-03-19 | Disposition: A | Discharge status: Home / Self Care | Payer: Medicare Other | Attending: Interventional Cardiology | Admitting: Interventional Cardiology

## 2013-03-18 ENCOUNTER — Emergency Department (HOSPITAL_COMMUNITY): Payer: Medicare Other

## 2013-03-18 ENCOUNTER — Encounter (HOSPITAL_COMMUNITY): Payer: Self-pay | Admitting: Emergency Medicine

## 2013-03-18 DIAGNOSIS — R918 Other nonspecific abnormal finding of lung field: Secondary | ICD-10-CM | POA: Diagnosis present

## 2013-03-18 DIAGNOSIS — I2510 Atherosclerotic heart disease of native coronary artery without angina pectoris: Secondary | ICD-10-CM | POA: Diagnosis present

## 2013-03-18 DIAGNOSIS — G309 Alzheimer's disease, unspecified: Secondary | ICD-10-CM | POA: Diagnosis not present

## 2013-03-18 DIAGNOSIS — I10 Essential (primary) hypertension: Secondary | ICD-10-CM | POA: Diagnosis present

## 2013-03-18 DIAGNOSIS — Z79899 Other long term (current) drug therapy: Secondary | ICD-10-CM | POA: Diagnosis not present

## 2013-03-18 DIAGNOSIS — R112 Nausea with vomiting, unspecified: Secondary | ICD-10-CM | POA: Diagnosis not present

## 2013-03-18 DIAGNOSIS — R079 Chest pain, unspecified: Secondary | ICD-10-CM | POA: Diagnosis not present

## 2013-03-18 DIAGNOSIS — Z87891 Personal history of nicotine dependence: Secondary | ICD-10-CM | POA: Insufficient documentation

## 2013-03-18 DIAGNOSIS — R0602 Shortness of breath: Secondary | ICD-10-CM | POA: Diagnosis not present

## 2013-03-18 DIAGNOSIS — E7800 Pure hypercholesterolemia, unspecified: Secondary | ICD-10-CM | POA: Diagnosis not present

## 2013-03-18 DIAGNOSIS — Z951 Presence of aortocoronary bypass graft: Secondary | ICD-10-CM | POA: Insufficient documentation

## 2013-03-18 DIAGNOSIS — G4730 Sleep apnea, unspecified: Secondary | ICD-10-CM | POA: Diagnosis present

## 2013-03-18 DIAGNOSIS — F0280 Dementia in other diseases classified elsewhere without behavioral disturbance: Secondary | ICD-10-CM | POA: Diagnosis not present

## 2013-03-18 DIAGNOSIS — E042 Nontoxic multinodular goiter: Secondary | ICD-10-CM | POA: Insufficient documentation

## 2013-03-18 DIAGNOSIS — R0789 Other chest pain: Principal | ICD-10-CM | POA: Insufficient documentation

## 2013-03-18 DIAGNOSIS — Z7982 Long term (current) use of aspirin: Secondary | ICD-10-CM | POA: Insufficient documentation

## 2013-03-18 DIAGNOSIS — J9819 Other pulmonary collapse: Secondary | ICD-10-CM | POA: Diagnosis not present

## 2013-03-18 DIAGNOSIS — R197 Diarrhea, unspecified: Secondary | ICD-10-CM | POA: Diagnosis not present

## 2013-03-18 HISTORY — DX: Alzheimer's disease, unspecified: G30.9

## 2013-03-18 HISTORY — DX: Dementia in other diseases classified elsewhere, unspecified severity, without behavioral disturbance, psychotic disturbance, mood disturbance, and anxiety: F02.80

## 2013-03-18 LAB — BASIC METABOLIC PANEL
BUN: 24 mg/dL — ABNORMAL HIGH (ref 6–23)
CO2: 16 mEq/L — ABNORMAL LOW (ref 19–32)
Calcium: 9.5 mg/dL (ref 8.4–10.5)
Chloride: 107 mEq/L (ref 96–112)
Creatinine, Ser: 1.03 mg/dL (ref 0.50–1.35)
GFR, EST AFRICAN AMERICAN: 85 mL/min — AB (ref >90)
GFR, EST NON AFRICAN AMERICAN: 74 mL/min — AB (ref >90)
Glucose, Bld: 128 mg/dL — ABNORMAL HIGH (ref 70–99)
POTASSIUM: 3.9 meq/L (ref 3.7–5.3)
Sodium: 141 mEq/L (ref 137–147)

## 2013-03-18 LAB — CBC
HEMATOCRIT: 47.1 % (ref 39.0–52.0)
Hemoglobin: 17.1 g/dL — ABNORMAL HIGH (ref 13.0–17.0)
MCH: 32.1 pg (ref 26.0–34.0)
MCHC: 36.3 g/dL — AB (ref 30.0–36.0)
MCV: 88.5 fL (ref 78.0–100.0)
Platelets: 205 10*3/uL (ref 150–400)
RBC: 5.32 MIL/uL (ref 4.22–5.81)
RDW: 13.6 % (ref 11.5–15.5)
WBC: 18.7 10*3/uL — ABNORMAL HIGH (ref 4.0–10.5)

## 2013-03-18 LAB — LACTIC ACID, PLASMA: Lactic Acid, Venous: 1.4 mmol/L (ref 0.5–2.2)

## 2013-03-18 LAB — I-STAT TROPONIN, ED: Troponin i, poc: 0 ng/mL (ref 0.00–0.08)

## 2013-03-18 MED ORDER — NITROGLYCERIN 2 % TD OINT
0.5000 [in_us] | TOPICAL_OINTMENT | Freq: Four times a day (QID) | TRANSDERMAL | Status: DC
  Administered 2013-03-18 – 2013-03-19 (×2): 0.5 [in_us] via TOPICAL
  Filled 2013-03-18: qty 30

## 2013-03-18 MED ORDER — GI COCKTAIL ~~LOC~~: 30.0000 mL | Freq: Three times a day (TID) | ORAL | Status: DC | PRN

## 2013-03-18 MED ORDER — ASPIRIN 81 MG PO CHEW
324.0000 mg | CHEWABLE_TABLET | Freq: Once | ORAL | Status: AC
  Administered 2013-03-18: 324 mg via ORAL
  Filled 2013-03-18: qty 4

## 2013-03-18 MED ORDER — ASPIRIN 81 MG PO CHEW
81.0000 mg | CHEWABLE_TABLET | Freq: Every day | ORAL | Status: DC
  Administered 2013-03-19: 81 mg via ORAL
  Filled 2013-03-18: qty 1

## 2013-03-18 MED ORDER — NITROGLYCERIN 0.4 MG SL SUBL
0.4000 mg | SUBLINGUAL_TABLET | SUBLINGUAL | Status: DC | PRN
Start: 2013-03-18 — End: 2013-03-19

## 2013-03-18 MED ORDER — ASPIRIN 81 MG PO TABS: 81.0000 mg | ORAL_TABLET | Freq: Every day | ORAL | Status: DC

## 2013-03-18 MED ORDER — ATORVASTATIN CALCIUM 80 MG PO TABS
80.0000 mg | ORAL_TABLET | Freq: Every day | ORAL | Status: DC
  Administered 2013-03-19: 80 mg via ORAL
  Filled 2013-03-18: qty 1

## 2013-03-18 MED ORDER — AMLODIPINE BESYLATE 5 MG PO TABS
5.0000 mg | ORAL_TABLET | Freq: Every day | ORAL | Status: DC
  Administered 2013-03-19: 5 mg via ORAL
  Filled 2013-03-18: qty 1

## 2013-03-18 MED ORDER — SERTRALINE HCL 50 MG PO TABS
150.0000 mg | ORAL_TABLET | Freq: Every day | ORAL | Status: DC
  Administered 2013-03-19: 150 mg via ORAL
  Filled 2013-03-18: qty 1

## 2013-03-18 MED ORDER — PANTOPRAZOLE SODIUM 40 MG PO TBEC
40.0000 mg | DELAYED_RELEASE_TABLET | Freq: Every day | ORAL | Status: DC
  Administered 2013-03-19: 40 mg via ORAL
  Filled 2013-03-18: qty 1

## 2013-03-18 MED ORDER — DONEPEZIL HCL 10 MG PO TABS
10.0000 mg | ORAL_TABLET | Freq: Every day | ORAL | Status: DC
  Administered 2013-03-18: 10 mg via ORAL
  Filled 2013-03-18 (×3): qty 1

## 2013-03-18 MED ORDER — SODIUM CHLORIDE 0.9 % IV SOLN
INTRAVENOUS | Status: DC
  Administered 2013-03-18: via INTRAVENOUS

## 2013-03-18 MED ORDER — METOPROLOL SUCCINATE ER 50 MG PO TB24
50.0000 mg | ORAL_TABLET | Freq: Every day | ORAL | Status: DC
  Administered 2013-03-19: 50 mg via ORAL
  Filled 2013-03-18: qty 1

## 2013-03-18 MED ORDER — HEPARIN (PORCINE) IN NACL 100-0.45 UNIT/ML-% IJ SOLN
1400.0000 [IU]/h | INTRAMUSCULAR | Status: DC
  Administered 2013-03-18: 1400 [IU]/h via INTRAVENOUS
  Filled 2013-03-18 (×2): qty 250

## 2013-03-18 MED ORDER — ACETAMINOPHEN ER 650 MG PO TBCR: 650.0000 mg | EXTENDED_RELEASE_TABLET | Freq: Three times a day (TID) | ORAL | Status: DC | PRN

## 2013-03-18 MED ORDER — NITROGLYCERIN 0.4 MG SL SUBL
0.4000 mg | SUBLINGUAL_TABLET | Freq: Once | SUBLINGUAL | Status: AC
  Administered 2013-03-18: 0.4 mg via SUBLINGUAL

## 2013-03-18 MED ORDER — ACETAMINOPHEN 325 MG PO TABS
650.0000 mg | ORAL_TABLET | Freq: Four times a day (QID) | ORAL | Status: DC | PRN
  Administered 2013-03-19: 650 mg via ORAL
  Filled 2013-03-18: qty 2

## 2013-03-18 MED ORDER — HEPARIN BOLUS VIA INFUSION
4000.0000 [IU] | Freq: Once | INTRAVENOUS | Status: AC
  Administered 2013-03-18: 4000 [IU] via INTRAVENOUS
  Filled 2013-03-18: qty 4000

## 2013-03-18 NOTE — ED Notes (Signed)
Pt remains in radiology 

## 2013-03-18 NOTE — ED Notes (Signed)
Pt reports central cp since this morning also vomiting and diarrhea. Reports he just feels bad and felt the same yesterday. Rates 5/10 cp. Pt is a x 4. Respirations smooth and unlabored.

## 2013-03-18 NOTE — ED Notes (Signed)
Handoff report given to floor RN.

## 2013-03-18 NOTE — ED Notes (Signed)
Patient states that he got dizzy and fell last week.  He forgot to communicate this to the doctor.

## 2013-03-18 NOTE — H&P (Addendum)
Patient ID: [name] MRN: [id number], DOB/AGE: 1945/06/15   Admit date: 03/18/2013   Primary Physician: Lenora Boys, MD Primary Cardiologist: Mendel Ryder, MD  Pt. Profile:  68 y/o male with h/o CAD s/p 5 vessel CABG in 04/2010, who presented to the ED with chest pain.  Problem List  Past Medical History  Diagnosis Date  . CAD (coronary artery disease)     a. 04/2010 CABG x 5: LIMA->LAD, VG->RI, VG->LCX, VG->RPDA->RPL  . Sleep apnea   . HTN (hypertension)   . Hypercholesteremia   . Multinodular goiter   . Colon polyps   . Pulmonary nodules   . Alzheimer's dementia     a. early.    Past Surgical History  Procedure Laterality Date  . Achilles tendon repair    . Coronary artery bypass grafting x5 with left  05/17/2010  . Pacemaker insertion      Allergies  No Known Allergies  HPI  68 y/o male with h/o CAD s/p CABG x 5 in 04/2010.  He also has a history of Alzheimer's dementia and stays in [facility]. His wife says that he sometimes has bouts of confusion or agitation. Patient says that ever since his bypass surgery, he has had intermittent chest discomfort most often occurring with exertion and sometimes associated with dyspnea. As result, he limits his activities so as to avoid experiencing chest discomfort. He says in general he doesn't think too much of the chest discomfort when it occurs and simply rests and results. He has not noticed any increase in the frequency of his chest discomfort recently.  Today, approximately one hour after eating breakfast, he noted mild retrosternal chest pressure without associated symptoms. He went onto his regular exercise class and while there, his chest pressure worsened and he was quite dyspneic. He returned to his room where he continued to have chest pressure and dyspnea and later developed nausea and vomiting. The staff there called his wife who came to pick him up and brought him to the ED. Here, his ECG is nonacute and  initial troponin is normal. He was treated with sublingual nitroglycerin with eventual relief of discomfort. He is currently pain-free.  Home Medications  Prior to Admission medications   Medication Sig Start Date End Date Taking? Authorizing Provider  acetaminophen (TYLENOL) 650 MG CR tablet Take 650 mg by mouth every 8 (eight) hours as needed for pain.   Yes Historical Provider, MD  amLODipine (NORVASC) 5 MG tablet Take 5 mg by mouth daily.   Yes Historical Provider, MD  aspirin 81 MG tablet Take 164 mg by mouth daily.    Yes Historical Provider, MD  co-enzyme Q-10 30 MG capsule Take 30 mg by mouth daily.    Yes Historical Provider, MD  donepezil (ARICEPT) 10 MG tablet Take 10 mg by mouth at bedtime.  10/08/10  Yes Historical Provider, MD  metoprolol (TOPROL-XL) 100 MG 24 hr tablet Take 50 mg by mouth daily.  09/27/10  Yes Historical Provider, MD  omeprazole (PRILOSEC) 20 MG capsule Take 20 mg by mouth daily.     Yes Historical Provider, MD  rosuvastatin (CRESTOR) 40 MG tablet Take 40 mg by mouth daily.   Yes Historical Provider, MD  sertraline (ZOLOFT) 100 MG tablet Take 150 mg by mouth daily. 1 1/2 tablets (150mg ) po every day   Yes Historical Provider, MD    Family History  Family History  Problem Relation Age of Onset  . Other  no premature CAD.    Social History  History   Social History  . Marital Status: Married    Spouse Name: N/A    Number of Children: N/A  . Years of Education: N/A   Occupational History  . Not on file.   Social History Main Topics  . Smoking status: Former Games developermoker  . Smokeless tobacco: Not on file  . Alcohol Use: Yes  . Drug Use: No  . Sexual Activity: Not on file   Other Topics Concern  . Not on file   Social History Narrative   [place] in [place]     Review of Systems General:  No chills, fever, night sweats or weight changes. He does experience periods of confusion and disorientation related to his Alzheimer's.   Cardiovascular:  Positive chest pain, positive dyspnea on exertion, no edema, orthopnea, palpitations, paroxysmal nocturnal dyspnea. Dermatological: No rash, lesions/masses Respiratory: No cough, positive dyspnea Urologic: No hematuria, dysuria Abdominal:   Positive nausea, vomiting this afternoon. No diarrhea, bright red blood per rectum, melena, or hematemesis Neurologic:  No visual changes, wkns, changes in mental status. All other systems reviewed and are otherwise negative except as noted above.  Physical Exam  Blood pressure 124/74, pulse 72, temperature 98.3 F (36.8 C), temperature source Oral, resp. rate 20, height 5\' 7"  (1.702 m), weight 230 lb (104.327 kg), SpO2 97.00%.  General: Pleasant, NAD Psych: Normal affect. Neuro: Alert and oriented X 3. Moves all extremities spontaneously. HEENT: Normal  Neck: Supple without bruits or JVD. Lungs:  Resp regular and unlabored, CTA. Heart: RRR no s3, s4, or murmurs. Abdomen: Soft, mild epigastric tenderness, non-distended, BS + x 4.  Extremities: No clubbing, cyanosis or edema. DP/PT/Radials 2+ and equal bilaterally.  Labs  Troponin [facility](Point of Care Test)  Recent Labs  03/18/13 1825  TROPIPOC 0.00    Lab Results  Component Value Date   WBC 18.7* 03/18/2013   HGB 17.1* 03/18/2013   HCT 47.1 03/18/2013   MCV 88.5 03/18/2013   PLT 205 03/18/2013     Recent Labs Lab 03/18/13 1752  NA 141  K 3.9  CL 107  CO2 16*  BUN 24*  CREATININE 1.03  CALCIUM 9.5  GLUCOSE 128*   Lab Results  Component Value Date   CHOL 123 12/17/2012   HDL 35.60* 12/17/2012   LDLCALC 60 12/17/2012   TRIG 138.0 12/17/2012   Radiology/Studies  Dg Chest 2 View  03/18/2013   CLINICAL DATA:  Chest pain.  EXAM: CHEST  2 VIEW  COMPARISON:  11/30/2011.  FINDINGS: Cardiopericardial silhouette is upper limits of normal for projection. Postoperative changes of CABG/ median sternotomy. Subsegmental atelectasis in the right middle lobe with elevation of  the right hemidiaphragm. No airspace disease/ pneumonia.  IMPRESSION: Subsegmental right middle lobe atelectasis.   Electronically Signed   By: Andreas NewportGeoffrey  Lamke M.D.   On: 03/18/2013 19:09   ECG  Rsr, 76, no acute st/t changes.  ASSESSMENT AND PLAN  1. Unstable angina/CAD: Patient presents with a several year history of intermittent exertional chest pressure and dyspnea and rest angina today that was worsened with activity and became associated with nausea and vomiting. Symptoms improved after receiving nitroglycerin. Despite prolonged duration of symptoms throughout the day today, his initial troponin is normal and ECG is nonacute. He is currently pain-free. We will plan to admit and cycle cardiac markers. Add heparin and ntp; continue aspirin, beta blocker, statin therapy. We will keep n.p.o. after midnight and consider diagnostic catheterization  in the morning. He would like to speak with Dr. [name] prior to proceeding and his wife indicates that she has concerns regarding his mental status and ability to follow directions for sustained period peri-catheterization.  2. Hypertension: Stable.  3. Hyperlipidemia: Check lipids and also teased. Continue statin therapy.  4. Alzheimer's dementia: He is currently alert and oriented. His wife says that he is on experimental medication that he receives in the form of an injection every 28 days in [place].  Cont aricept.  5.  Leukocytosis:  Afebrile.  CXR w/o pna.  No localizing Sx/Ss.  Check UA.  F/U CBC in AM.  Signed, Nicolasa Ducking, NP 03/18/2013, 8:06 PM  Patient seen and discussed with NP Brion Aliment. I agree with his documentation above. 68 yo male with prior history of CAD with CABG in 2012 presents with chest pain concerning for possible unstable angina. Initial EKG and cardiac enzymes negative. TIMI score >3 in the setting of possible unstable angina, will initiate anticoagulation with heparin gtt. We will defer the decision on ischemic  evaluation to his primary cardiologist Dr [name] in the morning. Continue home medications. He has an elevated WBC of unclear significance, no specific infectious symptoms. CXR is negative, will send UA and follow WBC trend. He has a metabolic gap acidosis, will check a lactic acid.    Dina Rich MD

## 2013-03-18 NOTE — Progress Notes (Signed)
ANTICOAGULATION CONSULT NOTE - Initial Consult  Pharmacy Consult for Heparin Indication: chest pain/ACS  No Known Allergies  Patient Measurements: Height: 5\' 7"  (170.2 cm) Weight: 230 lb (104.327 kg) IBW/kg (Calculated) : 66.1 Heparin Dosing Weight: 78 kg  Vital Signs: Temp: 98.3 F (36.8 C) (02/24 1730) Temp src: Oral (02/24 1730) BP: 112/75 mmHg (02/24 2000) Pulse Rate: 73 (02/24 2000)  Labs:  Recent Labs  03/18/13 1752  HGB 17.1*  HCT 47.1  PLT 205  CREATININE 1.03   Estimated Creatinine Clearance: 81.2 ml/min (by C-G formula based on Cr of 1.03).  Medical History: Past Medical History  Diagnosis Date  . CAD (coronary artery disease)     a. 04/2010 CABG x 5: LIMA->LAD, VG->RI, VG->LCX, VG->RPDA->RPL  . Sleep apnea   . HTN (hypertension)   . Hypercholesteremia   . Multinodular goiter   . Colon polyps   . Pulmonary nodules   . Alzheimer's dementia     a. early.   Assessment: 68 yo male presents this evening with c/o chest pain.  He has a past medical history of CAD with CABG in 2012 and HTN.  We have been asked to start his IV heparin while his cardiology work-up is ongoing.  Labs reviewed and his H/H is elevated (17.1/47.1) and he has a platelet count of 205K.  No noted bleeding complications.  A chest xray reveals some atelectasis.    Goal of Therapy:  Heparin level 0.2-0.3 Monitor platelets by anticoagulation protocol: Yes   Plan:  1.  Begin IV heparin with a 4000 unit bolus x 1 and start continuous infusion at 1400 units/hr. 2.  Obtain a heparin level 6-7 hours after starting and then daily with CBC. 3.  Monitor for bleeding complications and adjust.  Nadara MustardNita Coralyn Roselli, PharmD., MS Clinical Pharmacist Pager:  [phone] Thank you for allowing pharmacy to be part of this patients care team. 03/18/2013,10:05 PM

## 2013-03-18 NOTE — ED Notes (Signed)
Pt family states due to patients alzheimer's he will state pain at an 8 one minute and a 3 the next.

## 2013-03-18 NOTE — ED Provider Notes (Signed)
CSN: [id number]     Arrival date & time 03/18/13  1720 History   First MD Initiated Contact with Patient 03/18/13 1804     Chief Complaint  Patient presents with  . Chest Pain   HPI Comments: 68 yo M hx of CAD s/p 5-vessel CABG 2012, PCI, HTN, HLD, Multinodular goiter, Alzheimer's disease presents with CC of chest pain.  Pt states he has been fatigued since yesterday.  Today he had sudden onset, aching, central CP, nonradiating, at rest, with associated dyspnea, nausea, vomiting, diarrhea.  Symptoms worse with exertion.  Denies fever, chills, cough, congestion, abdominal pain, myalgias, rash, or any other symptoms.  Pt has taken home medications as prescribed, including baby ASA.  Symptoms remained constant throughout the day despite rest, and pt came to ED with family for evaluation of CP.    The history is provided by the patient and a relative. No language interpreter was used.    Past Medical History  Diagnosis Date  . CAD (coronary artery disease)   . Sleep apnea   . HTN (hypertension)   . Hypercholesteremia   . Multinodular goiter   . Colon polyps   . Pulmonary nodules    Past Surgical History  Procedure Laterality Date  . Achilles tendon repair    . Coronary artery bypass grafting x5 with left  05/17/2010   No family history on file. History  Substance Use Topics  . Smoking status: Former Games developermoker  . Smokeless tobacco: Not on file  . Alcohol Use: Yes    Review of Systems  Constitutional: Negative for fever and chills.  Respiratory: Positive for shortness of breath. Negative for cough, wheezing and stridor.   Cardiovascular: Positive for chest pain. Negative for palpitations and leg swelling.  Gastrointestinal: Positive for nausea, vomiting and diarrhea. Negative for abdominal pain and constipation.  Musculoskeletal: Negative for myalgias.  Skin: Negative for rash.  Neurological: Negative for dizziness, weakness, light-headedness, numbness and headaches.  Hematological:  Negative for adenopathy. Does not bruise/bleed easily.  All other systems reviewed and are negative.      Allergies  Review of patient's allergies indicates no known allergies.  Home Medications   Current Outpatient Rx  Name  Route  Sig  Dispense  Refill  . aspirin 81 MG tablet   Oral   Take 164 mg by mouth daily.          [name]. buPROPion (WELLBUTRIN XL) 150 MG 24 hr tablet   Oral   Take 150 mg by mouth every morning.          . calcium carbonate (TUMS - DOSED IN MG ELEMENTAL CALCIUM) 500 MG chewable tablet   Oral   Chew 1 tablet by mouth as needed.           . calcium-vitamin D (OSCAL WITH D) 500-200 MG-UNIT per tablet   Oral   Take 1 tablet by mouth daily.          [name]. co-enzyme Q-10 30 MG capsule   Oral   Take 30 mg by mouth 3 (three) times daily.           . CRESTOR 40 MG tablet      take 1 tablet by mouth once daily   30 tablet   10   . donepezil (ARICEPT) 10 MG tablet   Oral   Take 10 mg by mouth at bedtime.          . fish oil-omega-3 fatty acids 1000 MG capsule  Oral   Take 2 g by mouth daily.           . folic acid (FOLVITE) 800 MCG tablet   Oral   Take 400 mcg by mouth daily.           . metoprolol (TOPROL-XL) 100 MG 24 hr tablet   Oral   Take 100 mg by mouth daily.          [name] omeprazole (PRILOSEC) 20 MG capsule   Oral   Take 20 mg by mouth daily.           [name] oxycodone (OXY-IR) 5 MG capsule   Oral   Take 5 mg by mouth every 4 (four) hours as needed.           . sertraline (ZOLOFT) 100 MG tablet   Oral   Take 100 mg by mouth daily. 1 1/2 tablets (150mg ) po every day         . vitamin B-12 (CYANOCOBALAMIN) 1000 MCG tablet   Oral   Take 1,000 mcg by mouth daily.           . vitamin E (VITAMIN E) 1000 UNIT capsule   Oral   Take 1,000 Units by mouth daily.            BP 160/81  Pulse 76  Temp(Src) 98.3 F (36.8 C) (Oral)  Resp 18  Ht 5\' 7"  (1.702 m)  Wt 230 lb (104.327 kg)  BMI 36.01 kg/m2  SpO2  96% Physical Exam  Nursing note and vitals reviewed. Constitutional: He is oriented to person, place, and time. He appears well-developed and well-nourished.  HENT:  Head: Normocephalic and atraumatic.  Right Ear: External ear normal.  Left Ear: External ear normal.  Nose: Nose normal.  Mouth/Throat: Oropharynx is clear and moist.  Eyes: EOM are normal. Pupils are equal, round, and reactive to light.  Neck: Normal range of motion. Neck supple.  Cardiovascular: Normal rate, regular rhythm, normal heart sounds and intact distal pulses.   Pulmonary/Chest: Effort normal and breath sounds normal. No respiratory distress. He has no wheezes. He has no rales. He exhibits no tenderness.  Abdominal: Soft. Bowel sounds are normal. He exhibits no distension and no mass. There is no tenderness. There is no rebound and no guarding.  Musculoskeletal: Normal range of motion.  Neurological: He is alert and oriented to person, place, and time. He has normal reflexes.  Skin: Skin is warm and dry.    ED Course  Procedures (including critical care time) Labs Review Labs Reviewed  CBC - Abnormal; Notable for the following:    WBC 18.7 (*)    Hemoglobin 17.1 (*)    MCHC 36.3 (*)    All other components within normal limits  BASIC METABOLIC PANEL - Abnormal; Notable for the following:    CO2 16 (*)    Glucose, Bld 128 (*)    BUN 24 (*)    GFR calc non Af Amer 74 (*)    GFR calc Af Amer 85 (*)    All other components within normal limits  LACTIC ACID, PLASMA  HEPARIN LEVEL (UNFRACTIONATED)  CBC  TROPONIN I  TROPONIN I  TROPONIN I  PROTIME-INR  CBC WITH DIFFERENTIAL  TSH  COMPREHENSIVE METABOLIC PANEL  LIPID PANEL  URINALYSIS, ROUTINE W REFLEX MICROSCOPIC  I-STAT TROPOININ, ED   Imaging Review No results found.  EKG Interpretation    Date/Time:  Tuesday March 18 2013 17:25:08 EST Ventricular Rate:  76 PR  Interval:  152 QRS Duration: 90 QT Interval:  406 QTC Calculation: 456 R  Axis:   57 Text Interpretation:  Normal sinus rhythm Normal ECG Confirmed by BEATON  MD, ROBERT ([id number]) on 03/18/2013 6:30:38 PM            MDM   Final diagnoses:  None   68 yo M hx of CAD s/p 5-vessel CABG 2012, PCI, HTN, HLD, Multinodular goiter, Alzheimer's disease presents with CC of chest pain.  Filed Vitals:   03/18/13 1900  BP: 124/74  Pulse: 72  Temp:   Resp: 20   Physical exam as above.  Pt c/o central CP, 5/10 currently, SOB.  EKG as above, NSR without ischemic changes.  CXR subsegmental RML atelectasis, no infiltrate, pulmonary edema, or effusion.  Troponin 0.00.  Pt with slight leukocytosis, hemoconcentration with Hgb 17.1.  BMP significant for slightly low Bicarb 16, and slightly elevated BUN 24, with Cr 1.03.    Pt has been given ASA, nitroglycerin in ED.  On reeval pt with improved CP to 1/10.    Given pt's cardiac hx, Cardiology called for admission for cardiac w/o for unstable angina.  Pt and family understands and agrees with plan.  Jon Gills, MD     Jon Gills, MD 03/18/13 [phone]

## 2013-03-19 ENCOUNTER — Observation Stay (HOSPITAL_COMMUNITY): Payer: Medicare Other

## 2013-03-19 DIAGNOSIS — F0280 Dementia in other diseases classified elsewhere without behavioral disturbance: Secondary | ICD-10-CM | POA: Diagnosis not present

## 2013-03-19 DIAGNOSIS — R079 Chest pain, unspecified: Secondary | ICD-10-CM | POA: Diagnosis not present

## 2013-03-19 DIAGNOSIS — G309 Alzheimer's disease, unspecified: Secondary | ICD-10-CM | POA: Diagnosis not present

## 2013-03-19 DIAGNOSIS — I2510 Atherosclerotic heart disease of native coronary artery without angina pectoris: Secondary | ICD-10-CM | POA: Diagnosis not present

## 2013-03-19 DIAGNOSIS — R0789 Other chest pain: Secondary | ICD-10-CM | POA: Diagnosis not present

## 2013-03-19 LAB — URINALYSIS, ROUTINE W REFLEX MICROSCOPIC
Bilirubin Urine: NEGATIVE
Glucose, UA: NEGATIVE mg/dL
Ketones, ur: NEGATIVE mg/dL
Leukocytes, UA: NEGATIVE
Nitrite: NEGATIVE
Protein, ur: NEGATIVE mg/dL
Specific Gravity, Urine: 1.029 (ref 1.005–1.030)
Urobilinogen, UA: 0.2 mg/dL (ref 0.0–1.0)
pH: 5.5 (ref 5.0–8.0)

## 2013-03-19 LAB — LIPID PANEL
Cholesterol: 104 mg/dL (ref 0–200)
HDL: 41 mg/dL
LDL Cholesterol: 47 mg/dL (ref 0–99)
Total CHOL/HDL Ratio: 2.5 ratio
Triglycerides: 81 mg/dL
VLDL: 16 mg/dL (ref 0–40)

## 2013-03-19 LAB — COMPREHENSIVE METABOLIC PANEL WITH GFR
ALT: 22 U/L (ref 0–53)
AST: 23 U/L (ref 0–37)
Albumin: 3.6 g/dL (ref 3.5–5.2)
Alkaline Phosphatase: 69 U/L (ref 39–117)
BUN: 27 mg/dL — ABNORMAL HIGH (ref 6–23)
CO2: 21 meq/L (ref 19–32)
Calcium: 8.5 mg/dL (ref 8.4–10.5)
Chloride: 102 meq/L (ref 96–112)
Creatinine, Ser: 1 mg/dL (ref 0.50–1.35)
GFR calc Af Amer: 89 mL/min — ABNORMAL LOW
GFR calc non Af Amer: 76 mL/min — ABNORMAL LOW
Glucose, Bld: 128 mg/dL — ABNORMAL HIGH (ref 70–99)
Potassium: 3.5 meq/L — ABNORMAL LOW (ref 3.7–5.3)
Sodium: 139 meq/L (ref 137–147)
Total Bilirubin: 0.6 mg/dL (ref 0.3–1.2)
Total Protein: 7 g/dL (ref 6.0–8.3)

## 2013-03-19 LAB — TROPONIN I
Troponin I: <0.3 ng/mL
Troponin I: <0.3 ng/mL (ref <0.30)

## 2013-03-19 LAB — CBC WITH DIFFERENTIAL/PLATELET
Basophils Absolute: 0 10*3/uL (ref 0.0–0.1)
Basophils Relative: 0 % (ref 0–1)
EOS ABS: 0.1 10*3/uL (ref 0.0–0.7)
Eosinophils Relative: 1 % (ref 0–5)
HCT: 41.7 % (ref 39.0–52.0)
Hemoglobin: 14.7 g/dL (ref 13.0–17.0)
Lymphocytes Relative: 8 % — ABNORMAL LOW (ref 12–46)
Lymphs Abs: 0.9 10*3/uL (ref 0.7–4.0)
MCH: 31.3 pg (ref 26.0–34.0)
MCHC: 35.3 g/dL (ref 30.0–36.0)
MCV: 88.9 fL (ref 78.0–100.0)
Monocytes Absolute: 0.7 10*3/uL (ref 0.1–1.0)
Monocytes Relative: 6 % (ref 3–12)
NEUTROS PCT: 85 % — AB (ref 43–77)
Neutro Abs: 9.8 10*3/uL — ABNORMAL HIGH (ref 1.7–7.7)
PLATELETS: 189 10*3/uL (ref 150–400)
RBC: 4.69 MIL/uL (ref 4.22–5.81)
RDW: 13.8 % (ref 11.5–15.5)
WBC: 11.5 10*3/uL — ABNORMAL HIGH (ref 4.0–10.5)

## 2013-03-19 LAB — PROTIME-INR
INR: 1.01 (ref 0.00–1.49)
PROTHROMBIN TIME: 13.1 s (ref 11.6–15.2)

## 2013-03-19 LAB — URINE MICROSCOPIC-ADD ON

## 2013-03-19 LAB — HEPARIN LEVEL (UNFRACTIONATED): Heparin Unfractionated: 0.45 [IU]/mL (ref 0.30–0.70)

## 2013-03-19 LAB — TSH: TSH: 1.202 u[IU]/mL (ref 0.350–4.500)

## 2013-03-19 MED ORDER — REGADENOSON 0.4 MG/5ML IV SOLN
INTRAVENOUS | Status: AC
  Administered 2013-03-19: 0.4 mg via INTRAVENOUS
  Filled 2013-03-19: qty 5

## 2013-03-19 MED ORDER — TECHNETIUM TC 99M SESTAMIBI GENERIC - CARDIOLITE
10.0000 | Freq: Once | INTRAVENOUS | Status: AC | PRN
  Administered 2013-03-19: 10 via INTRAVENOUS

## 2013-03-19 MED ORDER — NITROGLYCERIN 0.4 MG SL SUBL: 0.4000 mg | SUBLINGUAL_TABLET | SUBLINGUAL | Status: AC | PRN

## 2013-03-19 MED ORDER — TECHNETIUM TC 99M SESTAMIBI GENERIC - CARDIOLITE
30.0000 | Freq: Once | INTRAVENOUS | Status: AC | PRN
  Administered 2013-03-19: 30 via INTRAVENOUS

## 2013-03-19 MED ORDER — REGADENOSON 0.4 MG/5ML IV SOLN
0.4000 mg | Freq: Once | INTRAVENOUS | Status: AC
  Administered 2013-03-19: 0.4 mg via INTRAVENOUS

## 2013-03-19 NOTE — Progress Notes (Signed)
Connor RuizJohn is well known to me. He now has dementia and gets confused. He has had chest pain since CABG. His markers and ECG this admission are unremarkable. Would do non invasive eval and try conservative approach if possible. Would cath only if nuclear is high risk. Home late today if low risk study.

## 2013-03-19 NOTE — Progress Notes (Signed)
ANTICOAGULATION CONSULT NOTE  Pharmacy Consult for Heparin Indication: chest pain/ACS  No Known Allergies  Patient Measurements: Height: 5\' 11"  (180.3 cm) Weight: 231 lb 4.8 oz (104.917 kg) IBW/kg (Calculated) : 75.3 Heparin Dosing Weight: 78 kg  Vital Signs: Temp: 99.1 F (37.3 C) (02/25 0500) Temp src: Oral (02/25 0500) BP: 107/68 mmHg (02/25 0500) Pulse Rate: 77 (02/25 0500)  Labs:  Recent Labs  03/18/13 1752 03/18/13 2331 03/19/13 0416  HGB 17.1*  --  14.7  HCT 47.1  --  41.7  PLT 205  --  189  LABPROT  --  13.1  --   INR  --  1.01  --   HEPARINUNFRC  --   --  0.45  CREATININE 1.03  --  1.00  TROPONINI  --  <0.30 <0.30   Estimated Creatinine Clearance: 89.5 ml/min (by C-G formula based on Cr of 1).  Assessment: 68 y.o. male with chest pain for heparin Goal of Therapy:  Heparin level 0.2-0.3 Monitor platelets by anticoagulation protocol: Yes   Plan:  Continue Heparin at current rate  Geannie RisenGreg Dee Paden, PharmD, BCPS  03/19/2013,6:38 AM

## 2013-03-19 NOTE — Discharge Summary (Signed)
Patient ID: [name],  MRN: [id number], DOB/AGE: Aug 18, 1945 68 y.o.  Admit date: 03/18/2013 Discharge date: 03/19/2013  Primary Care Provider: Dr Briscoe Deutscher Primary Cardiologist: Dr Linard Millers  Discharge Diagnoses Active Problems:   Chest pain   Unstable angina    Procedures: Leane Call 03/19/13   Hospital Course:  68 y/o male with h/o CAD s/p CABG x 5 in 04/2010. He also has a history of Alzheimer's dementia and stays in [facility]. His wife says that he sometimes has bouts of confusion or agitation. Patient says that ever since his bypass surgery, he has had intermittent chest discomfort most often occurring with exertion and sometimes associated with dyspnea. As result, he limits his activities so as to avoid experiencing chest discomfort.        On 03/18/13 he presented chest pain and dyspnea at his regular exercise class. This persisted and he came to the ER late on the 24 th. He ruled out for an MI. Myoview was low risk. He will follow up with an APP in two weeks and Dr Tamala Julian in April.    Discharge Vitals:  Blood pressure 132/72, pulse 72, temperature 99.1 F (37.3 C), temperature source Oral, resp. rate 18, height _0  (1.803 m), weight 227 lb 9.6 oz (103.239 kg), SpO2 96.00%.    Labs: Results for orders placed during the hospital encounter of 03/18/13 (from the past 48 hour(s))  CBC     Status: Abnormal   Collection Time    03/18/13  5:52 PM      Result Value Ref Range   WBC 18.7 (*) 4.0 - 10.5 K/uL   RBC 5.32  4.22 - 5.81 MIL/uL   Hemoglobin 17.1 (*) 13.0 - 17.0 g/dL   HCT 47.1  39.0 - 52.0 %   MCV 88.5  78.0 - 100.0 fL   MCH 32.1  26.0 - 34.0 pg   MCHC 36.3 (*) 30.0 - 36.0 g/dL   RDW 13.6  11.5 - 15.5 %   Platelets 205  150 - 400 K/uL  BASIC METABOLIC PANEL     Status: Abnormal   Collection Time    03/18/13  5:52 PM      Result Value Ref Range   Sodium 141  137 - 147 mEq/L   Potassium 3.9  3.7 - 5.3 mEq/L   Chloride 107  96 - 112 mEq/L   CO2 16 (*) 19 - 32 mEq/L   Glucose, Bld 128 (*) 70 - 99 mg/dL   BUN 24 (*) 6 - 23 mg/dL   Creatinine, Ser 1.03  0.50 - 1.35 mg/dL   Calcium 9.5  8.4 - 10.5 mg/dL   GFR calc non Af Amer 74 (*) >90 mL/min   GFR calc Af Amer 85 (*) >90 mL/min   Comment: (NOTE)     The eGFR has been calculated using the CKD EPI equation.     This calculation has not been validated in all clinical situations.     eGFR's persistently <90 mL/min signify possible Chronic Kidney     Disease.  Randolm Idol, ED     Status: None   Collection Time    03/18/13  6:25 PM      Result Value Ref Range   Troponin i, poc 0.00  0.00 - 0.08 ng/mL   Comment 3            Comment: Due to the release kinetics of cTnI,     a negative result  within the first hours     of the onset of symptoms does not rule out     myocardial infarction with certainty.     If myocardial infarction is still suspected,     repeat the test at appropriate intervals.  LACTIC ACID, PLASMA     Status: None   Collection Time    03/18/13 10:09 PM      Result Value Ref Range   Lactic Acid, Venous 1.4  0.5 - 2.2 mmol/L  TROPONIN I     Status: None   Collection Time    03/18/13 11:31 PM      Result Value Ref Range   Troponin I <0.30  <0.30 ng/mL   Comment:            Due to the release kinetics of cTnI,     a negative result within the first hours     of the onset of symptoms does not rule out     myocardial infarction with certainty.     If myocardial infarction is still suspected,     repeat the test at appropriate intervals.  PROTIME-INR     Status: None   Collection Time    03/18/13 11:31 PM      Result Value Ref Range   Prothrombin Time 13.1  11.6 - 15.2 seconds   INR 1.01  0.00 - 1.49  TSH     Status: None   Collection Time    03/18/13 11:31 PM      Result Value Ref Range   TSH 1.202  0.350 - 4.500 uIU/mL   Comment: Performed at [facility]     Status: Abnormal   Collection  Time    03/19/13  2:58 AM      Result Value Ref Range   Color, Urine YELLOW  YELLOW   APPearance CLEAR  CLEAR   Specific Gravity, Urine 1.029  1.005 - 1.030   pH 5.5  5.0 - 8.0   Glucose, UA NEGATIVE  NEGATIVE mg/dL   Hgb urine dipstick SMALL (*) NEGATIVE   Bilirubin Urine NEGATIVE  NEGATIVE   Ketones, ur NEGATIVE  NEGATIVE mg/dL   Protein, ur NEGATIVE  NEGATIVE mg/dL   Urobilinogen, UA 0.2  0.0 - 1.0 mg/dL   Nitrite NEGATIVE  NEGATIVE   Leukocytes, UA NEGATIVE  NEGATIVE  URINE MICROSCOPIC-ADD ON     Status: None   Collection Time    03/19/13  2:58 AM      Result Value Ref Range   Squamous Epithelial / LPF RARE  RARE   RBC / HPF 0-2  <3 RBC/hpf   Bacteria, UA RARE  RARE  HEPARIN LEVEL (UNFRACTIONATED)     Status: None   Collection Time    03/19/13  4:16 AM      Result Value Ref Range   Heparin Unfractionated 0.45  0.30 - 0.70 IU/mL   Comment:            IF HEPARIN RESULTS ARE BELOW     EXPECTED VALUES, AND PATIENT     DOSAGE HAS BEEN CONFIRMED,     SUGGEST FOLLOW UP TESTING     OF ANTITHROMBIN III LEVELS.  TROPONIN I     Status: None   Collection Time    03/19/13  4:16 AM      Result Value Ref Range   Troponin I <0.30  <0.30 ng/mL   Comment:  Due to the release kinetics of cTnI,     a negative result within the first hours     of the onset of symptoms does not rule out     myocardial infarction with certainty.     If myocardial infarction is still suspected,     repeat the test at appropriate intervals.  CBC WITH DIFFERENTIAL     Status: Abnormal   Collection Time    03/19/13  4:16 AM      Result Value Ref Range   WBC 11.5 (*) 4.0 - 10.5 K/uL   RBC 4.69  4.22 - 5.81 MIL/uL   Hemoglobin 14.7  13.0 - 17.0 g/dL   HCT 41.7  39.0 - 52.0 %   MCV 88.9  78.0 - 100.0 fL   MCH 31.3  26.0 - 34.0 pg   MCHC 35.3  30.0 - 36.0 g/dL   RDW 13.8  11.5 - 15.5 %   Platelets 189  150 - 400 K/uL   Neutrophils Relative % 85 (*) 43 - 77 %   Neutro Abs 9.8 (*) 1.7 - 7.7  K/uL   Lymphocytes Relative 8 (*) 12 - 46 %   Lymphs Abs 0.9  0.7 - 4.0 K/uL   Monocytes Relative 6  3 - 12 %   Monocytes Absolute 0.7  0.1 - 1.0 K/uL   Eosinophils Relative 1  0 - 5 %   Eosinophils Absolute 0.1  0.0 - 0.7 K/uL   Basophils Relative 0  0 - 1 %   Basophils Absolute 0.0  0.0 - 0.1 K/uL  COMPREHENSIVE METABOLIC PANEL     Status: Abnormal   Collection Time    03/19/13  4:16 AM      Result Value Ref Range   Sodium 139  137 - 147 mEq/L   Potassium 3.5 (*) 3.7 - 5.3 mEq/L   Chloride 102  96 - 112 mEq/L   CO2 21  19 - 32 mEq/L   Glucose, Bld 128 (*) 70 - 99 mg/dL   BUN 27 (*) 6 - 23 mg/dL   Creatinine, Ser 1.00  0.50 - 1.35 mg/dL   Calcium 8.5  8.4 - 10.5 mg/dL   Total Protein 7.0  6.0 - 8.3 g/dL   Albumin 3.6  3.5 - 5.2 g/dL   AST 23  0 - 37 U/L   ALT 22  0 - 53 U/L   Alkaline Phosphatase 69  39 - 117 U/L   Total Bilirubin 0.6  0.3 - 1.2 mg/dL   GFR calc non Af Amer 76 (*) >90 mL/min   GFR calc Af Amer 89 (*) >90 mL/min   Comment: (NOTE)     The eGFR has been calculated using the CKD EPI equation.     This calculation has not been validated in all clinical situations.     eGFR's persistently <90 mL/min signify possible Chronic Kidney     Disease.  LIPID PANEL     Status: None   Collection Time    03/19/13  4:16 AM      Result Value Ref Range   Cholesterol 104  0 - 200 mg/dL   Triglycerides 81  <150 mg/dL   HDL 41  >39 mg/dL   Total CHOL/HDL Ratio 2.5     VLDL 16  0 - 40 mg/dL   LDL Cholesterol 47  0 - 99 mg/dL   Comment:            Total Cholesterol/HDL:CHD Risk  Coronary Heart Disease Risk Table                         Men   Women      1/2 Average Risk   3.4   3.3      Average Risk       5.0   4.4      2 X Average Risk   9.6   7.1      3 X Average Risk  23.4   11.0                Use the calculated Patient Ratio     above and the CHD Risk Table     to determine the patient's CHD Risk.                ATP III CLASSIFICATION (LDL):      <100      mg/dL   Optimal      100-129  mg/dL   Near or Above                        Optimal      130-159  mg/dL   Borderline      160-189  mg/dL   High      >190     mg/dL   Very High  TROPONIN I     Status: None   Collection Time    03/19/13  9:58 AM      Result Value Ref Range   Troponin I <0.30  <0.30 ng/mL   Comment:            Due to the release kinetics of cTnI,     a negative result within the first hours     of the onset of symptoms does not rule out     myocardial infarction with certainty.     If myocardial infarction is still suspected,     repeat the test at appropriate intervals.    Disposition:  Follow-up Information   Follow up with Richardson Dopp, PA-C On 04/02/2013. (10:10 am)    Specialty:  Physician Assistant   Contact information:   [address] [place] [address] [phone]       Follow up with Sinclair Grooms, MD On 05/06/2013. (8:00 am)    Specialty:  Cardiology   Contact information:   [address] [place] [address] [phone]       Discharge Medications:    Medication List         acetaminophen 650 MG CR tablet  Commonly known as:  TYLENOL  Take 650 mg by mouth every 8 (eight) hours as needed for pain.     amLODipine 5 MG tablet  Commonly known as:  NORVASC  Take 5 mg by mouth daily.     aspirin 81 MG tablet  Take 164 mg by mouth daily.     co-enzyme Q-10 30 MG capsule  Take 30 mg by mouth daily.     donepezil 10 MG tablet  Commonly known as:  ARICEPT  Take 10 mg by mouth at bedtime.     metoprolol succinate 100 MG 24 hr tablet  Commonly known as:  TOPROL-XL  Take 50 mg by mouth daily.     nitroGLYCERIN 0.4 MG SL tablet  Commonly known as:  NITROSTAT  Place 1 tablet (0.4 mg total) under the  tongue every 5 (five) minutes x 3 doses as needed for chest pain.     omeprazole 20 MG capsule  Commonly known as:  PRILOSEC  Take 20 mg by mouth daily.     rosuvastatin 40 MG tablet  Commonly  known as:  CRESTOR  Take 40 mg by mouth daily.     sertraline 100 MG tablet  Commonly known as:  ZOLOFT  Take 150 mg by mouth daily. 1 1/2 tablets (12m) po every day         Duration of Discharge Encounter: Greater than 30 minutes including physician time.  SAngelena FormPA-C 03/19/2013 3:52 PM

## 2013-03-19 NOTE — Progress Notes (Signed)
Pt. D/c'd home with wife and daughter.  D/c instructions provided to pt and family with no questions at this time.  IV removed with catheter intact and no complications.  Pt left unit via wheelchair.

## 2013-03-19 NOTE — Progress Notes (Signed)
Clinical Social Work Department BRIEF PSYCHOSOCIAL ASSESSMENT 03/19/2013  Patient:  [name],[name]     Account Number:  [id number]     Admit date:  03/18/2013  Clinical Social Worker:  Harless NakayamaAMBELAL,Jianni Batten, LCSWA  Date/Time:  03/19/2013 10:20 AM  Referred by:  Physician  Date Referred:  03/19/2013 Referred for  ALF Placement   Other Referral:   Interview type:  Patient Other interview type:    PSYCHOSOCIAL DATA Living Status:  FACILITY Admitted from facility:  [facility] Level of care:  Independent Living Primary support name:  Leighton RoachDonna Mefferd [phone] Primary support relationship to patient:  SPOUSE Degree of support available:   Pt has supportive family    CURRENT CONCERNS Current Concerns  Post-Acute Placement   Other Concerns:    SOCIAL WORK ASSESSMENT / PLAN CSW informed that pt was admitted from facility. CSW spoke with pt who informed CSW he has resided at [facility] for almost a year now. Pt has no concerns and would like to return at dc. CSW contacted facility and was informed that pt is from independent living and can return whenever he is medically stable. Pt will not need any futher CSW intervention since he is independent living. Please reconsult if new needs arise.   Assessment/plan status:  Psychosocial Support/Ongoing Assessment of Needs Other assessment/ plan:   Information/referral to community resources:   None needed    PATIENT'S/FAMILY'S RESPONSE TO PLAN OF CARE: [facility]        [name], [place] [phone]

## 2013-03-19 NOTE — Discharge Instructions (Signed)
Chest Pain (Nonspecific) °It is often hard to give a specific diagnosis for the cause of chest pain. There is always a chance that your pain could be related to something serious, such as a heart attack or a blood clot in the lungs. You need to follow up with your caregiver for further evaluation. °CAUSES  °· Heartburn. °· Pneumonia or bronchitis. °· Anxiety or stress. °· Inflammation around your heart (pericarditis) or lung (pleuritis or pleurisy). °· A blood clot in the lung. °· A collapsed lung (pneumothorax). It can develop suddenly on its own (spontaneous pneumothorax) or from injury (trauma) to the chest. °· Shingles infection (herpes zoster virus). °The chest wall is composed of bones, muscles, and cartilage. Any of these can be the source of the pain. °· The bones can be bruised by injury. °· The muscles or cartilage can be strained by coughing or overwork. °· The cartilage can be affected by inflammation and become sore (costochondritis). °DIAGNOSIS  °Lab tests or other studies, such as X-rays, electrocardiography, stress testing, or cardiac imaging, may be needed to find the cause of your pain.  °TREATMENT  °· Treatment depends on what may be causing your chest pain. Treatment may include: °· Acid blockers for heartburn. °· Anti-inflammatory medicine. °· Pain medicine for inflammatory conditions. °· Antibiotics if an infection is present. °· You may be advised to change lifestyle habits. This includes stopping smoking and avoiding alcohol, caffeine, and chocolate. °· You may be advised to keep your head raised (elevated) when sleeping. This reduces the chance of acid going backward from your stomach into your esophagus. °· Most of the time, nonspecific chest pain will improve within 2 to 3 days with rest and mild pain medicine. °HOME CARE INSTRUCTIONS  °· If antibiotics were prescribed, take your antibiotics as directed. Finish them even if you start to feel better. °· For the next few days, avoid physical  activities that bring on chest pain. Continue physical activities as directed. °· Do not smoke. °· Avoid drinking alcohol. °· Only take over-the-counter or prescription medicine for pain, discomfort, or fever as directed by your caregiver. °· Follow your caregiver's suggestions for further testing if your chest pain does not go away. °· Keep any follow-up appointments you made. If you do not go to an appointment, you could develop lasting (chronic) problems with pain. If there is any problem keeping an appointment, you must call to reschedule. °SEEK MEDICAL CARE IF:  °· You think you are having problems from the medicine you are taking. Read your medicine instructions carefully. °· Your chest pain does not go away, even after treatment. °· You develop a rash with blisters on your chest. °SEEK IMMEDIATE MEDICAL CARE IF:  °· You have increased chest pain or pain that spreads to your arm, neck, jaw, back, or abdomen. °· You develop shortness of breath, an increasing cough, or you are coughing up blood. °· You have severe back or abdominal pain, feel nauseous, or vomit. °· You develop severe weakness, fainting, or chills. °· You have a fever. °THIS IS AN EMERGENCY. Do not wait to see if the pain will go away. Get medical help at once. Call your local emergency services (911 in [place] Do not drive yourself to the hospital. °MAKE SURE YOU:  °· Understand these instructions. °· Will watch your condition. °· Will get help right away if you are not doing well or get worse. °Document Released: 10/19/2004 Document Revised: 04/03/2011 Document Reviewed: 08/15/2007 °Exit[employer]® Patient Information ©2014 [employer]   [employer]. ° °

## 2013-03-19 NOTE — Progress Notes (Signed)
UR completed 

## 2013-03-20 NOTE — Discharge Summary (Signed)
Data reviewed and patient discharge was supervised. Myoview is low risk.

## 2013-03-21 ENCOUNTER — Ambulatory Visit: Payer: Medicare Other | Admitting: Cardiology

## 2013-03-23 NOTE — ED Provider Notes (Signed)
I saw and evaluated the patient, reviewed the resident's note and I agree with the findings and plan.   .Face to face Exam:  General:  Awake HEENT:  Atraumatic Resp:  Normal effort Abd:  Nondistended Neuro:No focal weakness Lymph: No adenopathy   Erico Stan L Errin Whitelaw, MD 03/23/13 0822 

## 2013-03-24 DIAGNOSIS — R42 Dizziness and giddiness: Secondary | ICD-10-CM | POA: Diagnosis not present

## 2013-03-24 DIAGNOSIS — F0280 Dementia in other diseases classified elsewhere without behavioral disturbance: Secondary | ICD-10-CM | POA: Diagnosis not present

## 2013-03-28 ENCOUNTER — Encounter: Payer: Self-pay | Admitting: *Deleted

## 2013-03-31 DIAGNOSIS — H2510 Age-related nuclear cataract, unspecified eye: Secondary | ICD-10-CM | POA: Diagnosis not present

## 2013-03-31 DIAGNOSIS — H04129 Dry eye syndrome of unspecified lacrimal gland: Secondary | ICD-10-CM | POA: Diagnosis not present

## 2013-03-31 DIAGNOSIS — R41841 Cognitive communication deficit: Secondary | ICD-10-CM | POA: Diagnosis not present

## 2013-04-01 ENCOUNTER — Telehealth: Payer: Self-pay | Admitting: Interventional Cardiology

## 2013-04-01 NOTE — Telephone Encounter (Signed)
lmom adv pt wife that appt with Tereso NewcomerScott Weaver.PA is for transition of care and to f/u with pt after his visit to the ED 2 weeks ago

## 2013-04-01 NOTE — Telephone Encounter (Signed)
New message          Pt wife would like to know if pt really needs to come into the office to see scott weaver for a post hospital visit. Please give her a call

## 2013-04-02 ENCOUNTER — Encounter: Payer: Medicare Other | Admitting: Physician Assistant

## 2013-04-02 DIAGNOSIS — R41841 Cognitive communication deficit: Secondary | ICD-10-CM | POA: Diagnosis not present

## 2013-04-09 DIAGNOSIS — R41841 Cognitive communication deficit: Secondary | ICD-10-CM | POA: Diagnosis not present

## 2013-04-11 ENCOUNTER — Ambulatory Visit: Payer: Medicare Other | Admitting: Cardiology

## 2013-04-14 DIAGNOSIS — R41841 Cognitive communication deficit: Secondary | ICD-10-CM | POA: Diagnosis not present

## 2013-04-15 DIAGNOSIS — R41841 Cognitive communication deficit: Secondary | ICD-10-CM | POA: Diagnosis not present

## 2013-04-21 DIAGNOSIS — R41841 Cognitive communication deficit: Secondary | ICD-10-CM | POA: Diagnosis not present

## 2013-04-28 DIAGNOSIS — M545 Low back pain, unspecified: Secondary | ICD-10-CM | POA: Diagnosis not present

## 2013-04-28 DIAGNOSIS — M79609 Pain in unspecified limb: Secondary | ICD-10-CM | POA: Diagnosis not present

## 2013-04-28 DIAGNOSIS — M629 Disorder of muscle, unspecified: Secondary | ICD-10-CM | POA: Diagnosis not present

## 2013-04-28 DIAGNOSIS — M2420 Disorder of ligament, unspecified site: Secondary | ICD-10-CM | POA: Diagnosis not present

## 2013-04-28 DIAGNOSIS — I10 Essential (primary) hypertension: Secondary | ICD-10-CM | POA: Diagnosis not present

## 2013-04-29 DIAGNOSIS — R41841 Cognitive communication deficit: Secondary | ICD-10-CM | POA: Diagnosis not present

## 2013-05-05 DIAGNOSIS — R41841 Cognitive communication deficit: Secondary | ICD-10-CM | POA: Diagnosis not present

## 2013-05-06 ENCOUNTER — Ambulatory Visit (INDEPENDENT_AMBULATORY_CARE_PROVIDER_SITE_OTHER): Payer: Medicare Other | Admitting: Interventional Cardiology

## 2013-05-06 ENCOUNTER — Encounter: Payer: Self-pay | Admitting: Interventional Cardiology

## 2013-05-06 VITALS — BP 123/72 | HR 54 | Ht 69.0 in | Wt 237.0 lb

## 2013-05-06 DIAGNOSIS — F0280 Dementia in other diseases classified elsewhere without behavioral disturbance: Secondary | ICD-10-CM | POA: Diagnosis not present

## 2013-05-06 DIAGNOSIS — I10 Essential (primary) hypertension: Secondary | ICD-10-CM

## 2013-05-06 DIAGNOSIS — E7800 Pure hypercholesterolemia, unspecified: Secondary | ICD-10-CM | POA: Diagnosis not present

## 2013-05-06 DIAGNOSIS — I2510 Atherosclerotic heart disease of native coronary artery without angina pectoris: Secondary | ICD-10-CM | POA: Diagnosis not present

## 2013-05-06 DIAGNOSIS — G309 Alzheimer's disease, unspecified: Secondary | ICD-10-CM | POA: Diagnosis not present

## 2013-05-06 NOTE — Progress Notes (Signed)
Patient ID: Connor PernaJohn J Poole, male   DOB: 09/04/1945, 68 y.o.   MRN: [id number]    [address] [place], [place]  [address] Phone: [phone] Fax:  [phone]  Date:  05/06/2013   ID:  [name], DOB 05/06/1945, MRN [id number]  PCP:  [name], ROBERT L, MD   ASSESSMENT:  1. Coronary artery disease stable without angina and recent nuclear study showed no evidence of ischemia 2. History of some labile blood pressures but on the axilla control today 3. Hyperlipidemia on therapy followed by us. Next evaluation will be in November  PLAN:  1. clinical followup in one year 2. Lipid panel in November 3. Active lifestyle   SUBJECTIVE: [name] is a 68 y.o. male who is asymptomatic. Recent nuclear study after an episode of atypical chest pain was unremarkable. He is done well since that time. He denies palpitations and has not had syncope   Wt Readings from Last 3 Encounters:  05/06/13 237 lb (107.502 kg)  03/19/13 227 lb 9.6 oz (103.239 kg)  11/30/11 235 lb (106.595 kg)     Past Medical History  Diagnosis Date  . CAD (coronary artery disease)     a. 04/2010 CABG x 5: LIMA->LAD, VG->RI, VG->LCX, VG->RPDA->RPL  . Sleep apnea   . HTN (hypertension)   . Hypercholesteremia   . Multinodular goiter   . Colon polyps   . Pulmonary nodules   . Alzheimer's dementia     a. early.    Current Outpatient Prescriptions  Medication Sig Dispense Refill  . acetaminophen (TYLENOL) 650 MG CR tablet Take 650 mg by mouth every 8 (eight) hours as needed for pain.      [name]. amLODipine (NORVASC) 5 MG tablet Take 5 mg by mouth daily.      [name]. aspirin 81 MG tablet Take 164 mg by mouth daily.       [name]. co-enzyme Q-10 30 MG capsule Take 30 mg by mouth daily.       [name]. donepezil (ARICEPT) 10 MG tablet Take 10 mg by mouth at bedtime.       . metoprolol (TOPROL-XL) 100 MG 24 hr tablet Take 50 mg by mouth daily.       . nitroGLYCERIN (NITROSTAT) 0.4 MG SL tablet Place 1 tablet (0.4 mg total)  under the tongue every 5 (five) minutes x 3 doses as needed for chest pain.  25 tablet  2  . omeprazole (PRILOSEC) 20 MG capsule Take 20 mg by mouth daily.        . rosuvastatin (CRESTOR) 40 MG tablet Take 40 mg by mouth daily.      . sertraline (ZOLOFT) 100 MG tablet Take 150 mg by mouth daily. 1 1/2 tablets (150mg ) po every day       No current facility-administered medications for this visit.    Allergies:   No Known Allergies  Social History:  The patient  reports that he has quit smoking. He does not have any smokeless tobacco history on file. He reports that he drinks alcohol. He reports that he does not use illicit drugs.   ROS:  Please see the history of present illness.      All other systems reviewed and negative.   OBJECTIVE: VS:  BP 123/72  Pulse 54  Ht 5\' 9"  (1.753 m)  Wt 237 lb (107.502 kg)  BMI 34.98 kg/m2 Well nourished, well developed, in no acute distress, obese HEENT: normal Neck: JVD flat. Carotid bruit  absent  Cardiac:  normal S1, S2; RRR; no murmur Lungs:  clear to auscultation bilaterally, no wheezing, rhonchi or rales Abd: soft, nontender, no hepatomegaly Ext: Edema absent. Pulses 2+ Skin: warm and dry Neuro:  CNs 2-12 intact, no focal abnormalities noted  EKG:  Normal with the exception of sinus bradycardia       Signed, Darci NeedleHenry W. B. Smith III, MD 05/06/2013 8:21 AM

## 2013-05-06 NOTE — Patient Instructions (Signed)
Your physician recommends that you continue on your current medications as directed. Please refer to the Current Medication list given to you today.  Your physician wants you to follow-up in: 1 year. You will receive a reminder letter in the mail two months in advance. If you don't receive a letter, please call our office to schedule the follow-up appointment.  

## 2013-05-11 ENCOUNTER — Emergency Department (HOSPITAL_COMMUNITY): Payer: Medicare Other

## 2013-05-11 ENCOUNTER — Encounter (HOSPITAL_COMMUNITY)
Admission: EM | Disposition: A | Discharge status: Home Health | Payer: Medicare Other | Source: Home / Self Care | Attending: Cardiology

## 2013-05-11 ENCOUNTER — Inpatient Hospital Stay (HOSPITAL_COMMUNITY)
Admission: EM | Admit: 2013-05-11 | Discharge: 2013-05-14 | DRG: 308 | Disposition: A | Discharge status: Home Health | Payer: Medicare Other | Attending: Cardiology | Admitting: Cardiology

## 2013-05-11 ENCOUNTER — Encounter (HOSPITAL_COMMUNITY): Payer: Self-pay | Admitting: Emergency Medicine

## 2013-05-11 ENCOUNTER — Other Ambulatory Visit (HOSPITAL_COMMUNITY): Payer: Medicare Other

## 2013-05-11 ENCOUNTER — Other Ambulatory Visit: Payer: Self-pay

## 2013-05-11 DIAGNOSIS — E042 Nontoxic multinodular goiter: Secondary | ICD-10-CM | POA: Diagnosis present

## 2013-05-11 DIAGNOSIS — Z87891 Personal history of nicotine dependence: Secondary | ICD-10-CM

## 2013-05-11 DIAGNOSIS — I455 Other specified heart block: Secondary | ICD-10-CM | POA: Diagnosis not present

## 2013-05-11 DIAGNOSIS — G309 Alzheimer's disease, unspecified: Secondary | ICD-10-CM | POA: Diagnosis present

## 2013-05-11 DIAGNOSIS — M25579 Pain in unspecified ankle and joints of unspecified foot: Secondary | ICD-10-CM | POA: Diagnosis not present

## 2013-05-11 DIAGNOSIS — Z951 Presence of aortocoronary bypass graft: Secondary | ICD-10-CM

## 2013-05-11 DIAGNOSIS — I469 Cardiac arrest, cause unspecified: Secondary | ICD-10-CM | POA: Diagnosis not present

## 2013-05-11 DIAGNOSIS — F0280 Dementia in other diseases classified elsewhere without behavioral disturbance: Secondary | ICD-10-CM | POA: Diagnosis present

## 2013-05-11 DIAGNOSIS — E785 Hyperlipidemia, unspecified: Secondary | ICD-10-CM | POA: Diagnosis present

## 2013-05-11 DIAGNOSIS — S99919A Unspecified injury of unspecified ankle, initial encounter: Secondary | ICD-10-CM | POA: Diagnosis not present

## 2013-05-11 DIAGNOSIS — I441 Atrioventricular block, second degree: Secondary | ICD-10-CM | POA: Diagnosis present

## 2013-05-11 DIAGNOSIS — I442 Atrioventricular block, complete: Secondary | ICD-10-CM | POA: Diagnosis not present

## 2013-05-11 DIAGNOSIS — R079 Chest pain, unspecified: Secondary | ICD-10-CM

## 2013-05-11 DIAGNOSIS — J9819 Other pulmonary collapse: Secondary | ICD-10-CM | POA: Diagnosis not present

## 2013-05-11 DIAGNOSIS — R55 Syncope and collapse: Secondary | ICD-10-CM | POA: Diagnosis present

## 2013-05-11 DIAGNOSIS — G4733 Obstructive sleep apnea (adult) (pediatric): Secondary | ICD-10-CM | POA: Diagnosis present

## 2013-05-11 DIAGNOSIS — Z7982 Long term (current) use of aspirin: Secondary | ICD-10-CM

## 2013-05-11 DIAGNOSIS — Z79899 Other long term (current) drug therapy: Secondary | ICD-10-CM

## 2013-05-11 DIAGNOSIS — G4730 Sleep apnea, unspecified: Secondary | ICD-10-CM | POA: Diagnosis present

## 2013-05-11 DIAGNOSIS — I498 Other specified cardiac arrhythmias: Secondary | ICD-10-CM | POA: Diagnosis present

## 2013-05-11 DIAGNOSIS — K635 Polyp of colon: Secondary | ICD-10-CM

## 2013-05-11 DIAGNOSIS — R404 Transient alteration of awareness: Secondary | ICD-10-CM | POA: Diagnosis not present

## 2013-05-11 DIAGNOSIS — I10 Essential (primary) hypertension: Secondary | ICD-10-CM | POA: Diagnosis present

## 2013-05-11 DIAGNOSIS — S8990XA Unspecified injury of unspecified lower leg, initial encounter: Secondary | ICD-10-CM | POA: Diagnosis not present

## 2013-05-11 DIAGNOSIS — I2510 Atherosclerotic heart disease of native coronary artery without angina pectoris: Secondary | ICD-10-CM | POA: Diagnosis present

## 2013-05-11 DIAGNOSIS — R918 Other nonspecific abnormal finding of lung field: Secondary | ICD-10-CM

## 2013-05-11 DIAGNOSIS — E7800 Pure hypercholesterolemia, unspecified: Secondary | ICD-10-CM

## 2013-05-11 DIAGNOSIS — I379 Nonrheumatic pulmonary valve disorder, unspecified: Secondary | ICD-10-CM | POA: Diagnosis not present

## 2013-05-11 HISTORY — DX: Atrioventricular block, complete: I44.2

## 2013-05-11 HISTORY — PX: TEMPORARY PACEMAKER INSERTION: SHX5471

## 2013-05-11 LAB — COMPREHENSIVE METABOLIC PANEL
ALT: 26 U/L (ref 0–53)
AST: 29 U/L (ref 0–37)
Albumin: 4.1 g/dL (ref 3.5–5.2)
Alkaline Phosphatase: 73 U/L (ref 39–117)
BILIRUBIN TOTAL: 0.5 mg/dL (ref 0.3–1.2)
BUN: 26 mg/dL — ABNORMAL HIGH (ref 6–23)
CALCIUM: 9.5 mg/dL (ref 8.4–10.5)
CO2: 21 meq/L (ref 19–32)
Chloride: 105 mEq/L (ref 96–112)
Creatinine, Ser: 0.87 mg/dL (ref 0.50–1.35)
GFR, EST NON AFRICAN AMERICAN: 88 mL/min — AB (ref >90)
Glucose, Bld: 134 mg/dL — ABNORMAL HIGH (ref 70–99)
Potassium: 4.1 mEq/L (ref 3.7–5.3)
SODIUM: 142 meq/L (ref 137–147)
Total Protein: 7.7 g/dL (ref 6.0–8.3)

## 2013-05-11 LAB — URINALYSIS, ROUTINE W REFLEX MICROSCOPIC
Bilirubin Urine: NEGATIVE
GLUCOSE, UA: NEGATIVE mg/dL
Hgb urine dipstick: NEGATIVE
KETONES UR: 15 mg/dL — AB
LEUKOCYTES UA: NEGATIVE
Nitrite: NEGATIVE
PH: 6 (ref 5.0–8.0)
Protein, ur: NEGATIVE mg/dL
Specific Gravity, Urine: 1.027 (ref 1.005–1.030)
Urobilinogen, UA: 0.2 mg/dL (ref 0.0–1.0)

## 2013-05-11 LAB — CBC WITH DIFFERENTIAL/PLATELET
BASOS ABS: 0 10*3/uL (ref 0.0–0.1)
Basophils Relative: 0 % (ref 0–1)
Eosinophils Absolute: 0 10*3/uL (ref 0.0–0.7)
Eosinophils Relative: 0 % (ref 0–5)
HCT: 43.9 % (ref 39.0–52.0)
Hemoglobin: 15.5 g/dL (ref 13.0–17.0)
LYMPHS PCT: 8 % — AB (ref 12–46)
Lymphs Abs: 1.4 10*3/uL (ref 0.7–4.0)
MCH: 31.3 pg (ref 26.0–34.0)
MCHC: 35.3 g/dL (ref 30.0–36.0)
MCV: 88.7 fL (ref 78.0–100.0)
Monocytes Absolute: 1.4 10*3/uL — ABNORMAL HIGH (ref 0.1–1.0)
Monocytes Relative: 8 % (ref 3–12)
NEUTROS ABS: 14.7 10*3/uL — AB (ref 1.7–7.7)
Neutrophils Relative %: 84 % — ABNORMAL HIGH (ref 43–77)
PLATELETS: 239 10*3/uL (ref 150–400)
RBC: 4.95 MIL/uL (ref 4.22–5.81)
RDW: 13.6 % (ref 11.5–15.5)
WBC: 17.5 10*3/uL — AB (ref 4.0–10.5)

## 2013-05-11 LAB — MAGNESIUM: Magnesium: 2.3 mg/dL (ref 1.5–2.5)

## 2013-05-11 LAB — MRSA PCR SCREENING: MRSA BY PCR: NEGATIVE

## 2013-05-11 LAB — TSH: TSH: 1.48 u[IU]/mL (ref 0.350–4.500)

## 2013-05-11 LAB — PROTIME-INR
INR: 1.07 (ref 0.00–1.49)
Prothrombin Time: 13.7 seconds (ref 11.6–15.2)

## 2013-05-11 LAB — CBG MONITORING, ED: Glucose-Capillary: 127 mg/dL — ABNORMAL HIGH (ref 70–99)

## 2013-05-11 LAB — TROPONIN I: Troponin I: <0.3 ng/mL (ref <0.30)

## 2013-05-11 SURGERY — TEMPORARY PACEMAKER INSERTION
Anesthesia: LOCAL

## 2013-05-11 MED ORDER — ENOXAPARIN SODIUM 40 MG/0.4ML ~~LOC~~ SOLN
40.0000 mg | SUBCUTANEOUS | Status: DC
  Administered 2013-05-12 – 2013-05-13 (×2): 40 mg via SUBCUTANEOUS
  Filled 2013-05-11 (×4): qty 0.4

## 2013-05-11 MED ORDER — AMLODIPINE BESYLATE 5 MG PO TABS
5.0000 mg | ORAL_TABLET | Freq: Every day | ORAL | Status: DC
  Administered 2013-05-12 – 2013-05-14 (×3): 5 mg via ORAL
  Filled 2013-05-11 (×4): qty 1

## 2013-05-11 MED ORDER — ACETAMINOPHEN 325 MG PO TABS
650.0000 mg | ORAL_TABLET | Freq: Three times a day (TID) | ORAL | Status: DC | PRN
  Administered 2013-05-12 – 2013-05-14 (×3): 650 mg via ORAL
  Filled 2013-05-11 (×3): qty 2

## 2013-05-11 MED ORDER — ASPIRIN 81 MG PO TABS: 164.0000 mg | ORAL_TABLET | Freq: Every day | ORAL | Status: DC

## 2013-05-11 MED ORDER — SODIUM CHLORIDE 0.9 % IJ SOLN
3.0000 mL | Freq: Two times a day (BID) | INTRAMUSCULAR | Status: DC
  Administered 2013-05-11 – 2013-05-13 (×5): 3 mL via INTRAVENOUS
  Administered 2013-05-14: 13:00:00 via INTRAVENOUS

## 2013-05-11 MED ORDER — ONDANSETRON HCL 4 MG PO TABS: 4.0000 mg | ORAL_TABLET | Freq: Four times a day (QID) | ORAL | Status: DC | PRN

## 2013-05-11 MED ORDER — ACETAMINOPHEN ER 650 MG PO TBCR: 650.0000 mg | EXTENDED_RELEASE_TABLET | Freq: Three times a day (TID) | ORAL | Status: DC | PRN

## 2013-05-11 MED ORDER — METOPROLOL SUCCINATE ER 50 MG PO TB24
50.0000 mg | ORAL_TABLET | Freq: Every day | ORAL | Status: DC
  Filled 2013-05-11: qty 1

## 2013-05-11 MED ORDER — SODIUM CHLORIDE 0.9 % IV SOLN
1000.0000 mL | Freq: Once | INTRAVENOUS | Status: AC
  Administered 2013-05-11: 1000 mL via INTRAVENOUS

## 2013-05-11 MED ORDER — ATORVASTATIN CALCIUM 20 MG PO TABS
20.0000 mg | ORAL_TABLET | Freq: Every day | ORAL | Status: DC
  Administered 2013-05-12 – 2013-05-13 (×2): 20 mg via ORAL
  Filled 2013-05-11 (×4): qty 1

## 2013-05-11 MED ORDER — DONEPEZIL HCL 10 MG PO TABS
10.0000 mg | ORAL_TABLET | Freq: Every day | ORAL | Status: DC
  Filled 2013-05-11: qty 1

## 2013-05-11 MED ORDER — ONDANSETRON HCL 4 MG/2ML IJ SOLN: 4.0000 mg | Freq: Four times a day (QID) | INTRAMUSCULAR | Status: DC | PRN

## 2013-05-11 MED ORDER — NITROGLYCERIN 0.4 MG SL SUBL: 0.4000 mg | SUBLINGUAL_TABLET | SUBLINGUAL | Status: DC | PRN

## 2013-05-11 MED ORDER — PANTOPRAZOLE SODIUM 40 MG PO TBEC
40.0000 mg | DELAYED_RELEASE_TABLET | Freq: Every day | ORAL | Status: DC
  Administered 2013-05-12 – 2013-05-14 (×3): 40 mg via ORAL
  Filled 2013-05-11 (×3): qty 1

## 2013-05-11 MED ORDER — ASPIRIN EC 81 MG PO TBEC
162.0000 mg | DELAYED_RELEASE_TABLET | Freq: Every day | ORAL | Status: DC
  Administered 2013-05-12 – 2013-05-14 (×3): 162 mg via ORAL
  Filled 2013-05-11 (×3): qty 2

## 2013-05-11 MED ORDER — SERTRALINE HCL 50 MG PO TABS
150.0000 mg | ORAL_TABLET | Freq: Every day | ORAL | Status: DC
  Administered 2013-05-12 – 2013-05-14 (×3): 150 mg via ORAL
  Filled 2013-05-11 (×3): qty 1

## 2013-05-11 MED ORDER — SODIUM CHLORIDE 0.9 % IV SOLN
1000.0000 mL | INTRAVENOUS | Status: DC
  Administered 2013-05-11: 1000 mL via INTRAVENOUS

## 2013-05-11 NOTE — Interval H&P Note (Signed)
History and Physical Interval Note:  05/11/2013 3:28 PM  [name]  has presented today for surgery, with the diagnosis of sinus bradycardia  The various methods of treatment have been discussed with the patient and family. After consideration of risks, benefits and other options for treatment, the patient has consented to  Procedure(s): TEMPORARY PACEMAKER INSERTION (N/A) as a surgical intervention .  The patient's history has been reviewed, patient examined, no change in status, stable for surgery.  I have reviewed the patient's chart and labs.  Questions were answered to the patient's satisfaction.     Corky CraftsJayadeep S Teyton Pattillo

## 2013-05-11 NOTE — H&P (Signed)
[facility] History and Physical  Connor PernaJohn J Figuereo ZOX:[id number] DOB: Feb 13, 1945 DOA: 05/11/2013  Referring physician: Linwood DibblesJon Knapp PCP: [name], ROBERT L, MD   Chief Complaint: syncope  HPI: [name] is a 68 y.o. male  With alzheimers disease, HTN, CAD from ALF presents with multiple syncopal episodes since yesterday.  Sometimes with loss of consciousness, sometimes with staring episodes. Larey SeatFell and sustained an abrasion to right back, ankle contusion.  No tonic clonic activity. No loss of bladder function.  Per daughter, who is an ED RN, became briefly diaphoretic, unresponsive, pale and bradycardic into the 40s in the ED. Lasted less than a minute.  Had a recent low risk myoview. Followed by Dr. Mendel RyderH. Smith. No CP, palpitations. Feels hot all the time, but this is not new. WBC in ED 18K. UA pending.  CXR negative.  Feels fine now. Recently started on benazepril and family wondering whether this may be contributing.  Orthostatics negative after a liter of fluid   Review of Systems:  Systems reviewed. As above, otherwise negative.  Past Medical History  Diagnosis Date  . CAD (coronary artery disease)     a. 04/2010 CABG x 5: LIMA->LAD, VG->RI, VG->LCX, VG->RPDA->RPL  . Sleep apnea   . HTN (hypertension)   . Hypercholesteremia   . Multinodular goiter   . Colon polyps   . Pulmonary nodules   . Alzheimer's dementia     a. early.   Past Surgical History  Procedure Laterality Date  . Achilles tendon repair    . Coronary artery bypass grafting x5 with left  05/17/2010   Social History:  reports that he has quit smoking. He does not have any smokeless tobacco history on file. He reports that he drinks alcohol. He reports that he does not use illicit drugs.  No Known Allergies  Family History  Problem Relation Age of Onset  . Heart disease Father     no premature CAD.  [name]. Heart failure Mother   . Cancer - Other Brother   . Diabetes Brother   . Hypertension Brother      Prior  to Admission medications   Medication Sig Start Date End Date Taking? Authorizing Provider  acetaminophen (TYLENOL) 650 MG CR tablet Take 650 mg by mouth every 8 (eight) hours as needed for pain.    Historical Provider, MD  amLODipine (NORVASC) 5 MG tablet Take 5 mg by mouth daily.    Historical Provider, MD  aspirin 81 MG tablet Take 164 mg by mouth daily.     Historical Provider, MD  co-enzyme Q-10 30 MG capsule Take 30 mg by mouth daily.     Historical Provider, MD  donepezil (ARICEPT) 10 MG tablet Take 10 mg by mouth at bedtime.  10/08/10   Historical Provider, MD  metoprolol (TOPROL-XL) 100 MG 24 hr tablet Take 50 mg by mouth daily.  09/27/10   Historical Provider, MD  nitroGLYCERIN (NITROSTAT) 0.4 MG SL tablet Place 1 tablet (0.4 mg total) under the tongue every 5 (five) minutes x 3 doses as needed for chest pain. 03/19/13   Abelino DerrickLuke K Kilroy, PA-C  omeprazole (PRILOSEC) 20 MG capsule Take 20 mg by mouth daily.      Historical Provider, MD  rosuvastatin (CRESTOR) 40 MG tablet Take 40 mg by mouth daily.    Historical Provider, MD  sertraline (ZOLOFT) 100 MG tablet Take 150 mg by mouth daily. 1 1/2 tablets (150mg ) po every day    Historical Provider, MD   Physical Exam:  Filed Vitals:   05/11/13 1200  BP: 123/66  Pulse: 63  Temp:   Resp: 12    BP 123/66  Pulse 63  Temp(Src) 98 F (36.7 C) (Oral)  Resp 12  SpO2 98%  BP 121/69  Pulse 87  Temp(Src) 98.7 F (37.1 C) (Oral)  Resp 18  Ht 5\' 11"  (1.803 m)  Wt 104.781 kg (231 lb)  BMI 32.23 kg/m2  SpO2 99%  General Appearance:    Alert, cooperative, no distress, appears stated age. Slightly forgetful  Head:    Normocephalic, without obvious abnormality, atraumatic  Eyes:    PERRL, conjunctiva/corneas clear, EOM's intact, fundi    benign, both eyes          Nose:   Nares normal, septum midline, mucosa normal, no drainage   or sinus tenderness  Throat:   Lips, mucosa, and tongue normal; teeth and gums normal  Neck:   Supple,  symmetrical, trachea midline, no adenopathy;       thyroid:  No enlargement/tenderness/nodules; no carotid   bruit or JVD  Back:     Symmetric, no curvature, ROM normal, no CVA tenderness  Lungs:     Clear to auscultation bilaterally, respirations unlabored  Chest wall:    No tenderness or deformity  Heart:    Regular rate and rhythm, S1 and S2 normal, no murmur, rub   or gallop  Abdomen:     Soft, non-tender, bowel sounds active all four quadrants,    no masses, no organomegaly  Genitalia:    deferred  Rectal:    deferred  Extremities:   Extremities normal, atraumatic, no cyanosis or edema  Pulses:   2+ and symmetric all extremities  Skin:   Skin color, texture, turgor normal, no rashes or lesions  Lymph nodes:   Cervical, supraclavicular, and axillary nodes normal  Neurologic:   CNII-XII intact. Normal strength, sensation and reflexes      throughout             Psych: normal affect. Calm and cooperative.  Labs on Admission:  Basic Metabolic Panel:  Recent Labs Lab 05/11/13 0940  NA 142  K 4.1  CL 105  CO2 21  GLUCOSE 134*  BUN 26*  CREATININE 0.87  CALCIUM 9.5   Liver Function Tests:  Recent Labs Lab 05/11/13 0940  AST 29  ALT 26  ALKPHOS 73  BILITOT 0.5  PROT 7.7  ALBUMIN 4.1   No results found for this basename: LIPASE, AMYLASE,  in the last 168 hours No results found for this basename: AMMONIA,  in the last 168 hours CBC:  Recent Labs Lab 05/11/13 0940  WBC 17.5*  NEUTROABS 14.7*  HGB 15.5  HCT 43.9  MCV 88.7  PLT 239   Cardiac Enzymes: No results found for this basename: CKTOTAL, CKMB, CKMBINDEX, TROPONINI,  in the last 168 hours  BNP (last 3 results) No results found for this basename: PROBNP,  in the last 8760 hours CBG:  Recent Labs Lab 05/11/13 0939  GLUCAP 127*    Radiological Exams on Admission: Dg Chest 2 View  05/11/2013   CLINICAL DATA:  Cough.  EXAM: CHEST  2 VIEW  COMPARISON:  03/18/2013.  FINDINGS: Low lung volumes.  Mild right lung subsegmental atelectatic change with slight scarring persists. Cardiomegaly status post CABG. No active infiltrates or failure. Slight blunting right CP angle. Negative osseous structures.  IMPRESSION: Cardiac enlargement without focal infiltrates or CHF. Low lung volumes.   Electronically Signed   By:  Davonna BellingJohn  Curnes M.D.   On: 05/11/2013 10:59   Dg Ankle Complete Right  05/11/2013   CLINICAL DATA:  History of trauma from a fall. Right-sided ankle pain.  EXAM: RIGHT ANKLE - COMPLETE 3+ VIEW  COMPARISON:  None.  FINDINGS: There is no evidence of fracture, dislocation, or joint effusion. There is no evidence of arthropathy or other focal bone abnormality. Soft tissues are unremarkable. Small plantar calcaneal spur incidentally noted.  IMPRESSION: Negative.   Electronically Signed   By: Trudie Reedaniel  Entrikin M.D.   On: 05/11/2013 10:57   Ct Head Wo Contrast  05/11/2013   CLINICAL DATA:  Multiple syncopal episodes in the past few days. History of hypertension.  EXAM: CT HEAD WITHOUT CONTRAST  TECHNIQUE: Contiguous axial images were obtained from the base of the skull through the vertex without contrast.  COMPARISON:  08/19/2011 MR and 05/29/2009 MR.  FINDINGS: Mild cerebral and cerebellar atrophy. Chronic microvascular ischemic change affects the periventricular and subcortical white matter. Remote lacunar infarcts affect the bilateral basal ganglia. No evidence for acute infarction, hemorrhage, mass lesion, hydrocephalus, or extra-axial fluid. Vascular calcification. No CT signs of proximal vascular thrombosis. Left vertebral dominant. Clear sinuses and mastoids.  Compared with prior studies, a similar appearance is noted.  IMPRESSION: No acute intracranial abnormality.  Chronic changes as described.   Electronically Signed   By: Davonna BellingJohn  Curnes M.D.   On: 05/11/2013 10:36    EKG: NSR  Assessment/Plan Principal Problem:   Syncope: etiology not clear. Observe on telemetry.  F/u UA.  Check echo,  carotid dopplers, TSH EEG. Active Problems:   CAD - CABG x 28 April 2010   Sleep apnea on CPAP   HTN (hypertension)   Hypercholesteremia   Multinodular goiter   Pulmonary nodules- biopsy Oct 2011   Alzheimer's dementia  Code Status: full Family Communication: multiple at bedside Disposition Plan: back to ALF when stable  Time spent: 60  Christiane Haorinna L Zakiyah Diop [facility] Pager [phone]

## 2013-05-11 NOTE — Consult Note (Signed)
CARDIOLOGY CONSULT NOTE   Patient ID: [name] MRN: [id number], DOB/AGE: [birth year]   Admit date: 05/11/2013 Date of Consult: 05/11/2013   Primary Physician: Lenora Boys, MD Primary Cardiologist: Dr. [name]  Pt. Profile  This 68 year old gentleman with a past history of ischemic heart disease and prior CABG is admitted because of multiple syncopal episodes since yesterday.  Problem List  Past Medical History  Diagnosis Date  . CAD (coronary artery disease)     a. 04/2010 CABG x 5: LIMA->LAD, VG->RI, VG->LCX, VG->RPDA->RPL  . Sleep apnea   . HTN (hypertension)   . Hypercholesteremia   . Multinodular goiter   . Colon polyps   . Pulmonary nodules   . Alzheimer's dementia     a. early.    Past Surgical History  Procedure Laterality Date  . Achilles tendon repair    . Coronary artery bypass grafting x5 with left  05/17/2010     Allergies  No Known Allergies  HPI   This 68 year old gentleman with a past history of ischemic heart disease and prior CABG is admitted because of multiple syncopal episodes since yesterday.  The patient has not been experiencing any recent chest pain.  He had a recent Myoview which was low risk.  Yesterday he has had multiple syncopal episodes.  Since arrival on [facility]. heart unit the patient has had multiple documented episodes of asystole with nothing but nonconducted P waves these episodes have lasted more than 30 seconds and are associated with syncope.  The episodes occur without warning.  The patient has been on Toprol XL 50 mg daily as well as donepezil (Aricept) 10 mg daily.  He has been on both of these for a long time.  Just recently the patient was started on benazepril.  His electrolytes are normal. The patient has a past history of ischemic heart disease and had CABG x5 in April 2012. The patient has a history of Alzheimer's disease and is a resident of [facility].  His family prepares his medication for him.  Inpatient  Medications  . amLODipine  5 mg Oral Daily  . [START ON 05/12/2013] aspirin EC  162 mg Oral Daily  . atorvastatin  20 mg Oral q1800  . enoxaparin (LOVENOX) injection  40 mg Subcutaneous Q24H  . pantoprazole  40 mg Oral Daily  . [START ON 05/12/2013] sertraline  150 mg Oral Daily  . sodium chloride  3 mL Intravenous Q12H    Family History Family History  Problem Relation Age of Onset  . Heart disease Father     no premature CAD.  [name] Heart failure Mother   . Cancer - Other Brother   . Diabetes Brother   . Hypertension Brother      Social History History   Social History  . Marital Status: Married    Spouse Name: N/A    Number of Children: N/A  . Years of Education: N/A   Occupational History  . Not on file.   Social History Main Topics  . Smoking status: Former Games developer  . Smokeless tobacco: Not on file  . Alcohol Use: Yes  . Drug Use: No  . Sexual Activity: Not on file   Other Topics Concern  . Not on file   Social History Narrative   Pt lives in [place].     Review of Systems  General:  No chills, fever, night sweats or weight changes.  Cardiovascular:  No chest pain, dyspnea on exertion, edema,  orthopnea, palpitations, paroxysmal nocturnal dyspnea. Dermatological: No rash, lesions/masses Respiratory: No cough, dyspnea Urologic: No hematuria, dysuria Abdominal:   No nausea, vomiting, diarrhea, bright red blood per rectum, melena, or hematemesis Neurologic:  No visual changes, wkns, changes in mental status. All other systems reviewed and are otherwise negative except as noted above.  Physical Exam  Blood pressure 121/69, pulse 87, temperature 98.7 F (37.1 C), temperature source Oral, resp. rate 18, height 5\' 11"  (1.803 m), weight 231 lb (104.781 kg), SpO2 99.00%.  General: Pleasant, NAD Psych: Normal affect. Neuro: Alert and oriented X 3. Moves all extremities spontaneously. HEENT: Normal  Neck: Supple without bruits or JVD. Lungs:  Resp regular  and unlabored, CTA. Heart: RRR no s3, s4, or murmurs. Abdomen: Soft, non-tender, non-distended, BS + x 4.  Extremities: No clubbing, cyanosis or edema. DP/PT/Radials 2+ and equal bilaterally.  Labs  No results found for this basename: CKTOTAL, CKMB, TROPONINI,  in the last 72 hours Lab Results  Component Value Date   WBC 17.5* 05/11/2013   HGB 15.5 05/11/2013   HCT 43.9 05/11/2013   MCV 88.7 05/11/2013   PLT 239 05/11/2013     Recent Labs Lab 05/11/13 0940  NA 142  K 4.1  CL 105  CO2 21  BUN 26*  CREATININE 0.87  CALCIUM 9.5  PROT 7.7  BILITOT 0.5  ALKPHOS 73  ALT 26  AST 29  GLUCOSE 134*   Lab Results  Component Value Date   CHOL 104 03/19/2013   HDL 41 03/19/2013   LDLCALC 47 03/19/2013   TRIG 81 03/19/2013   No results found for this basename: DDIMER    Radiology/Studies  Dg Chest 2 View  05/11/2013   CLINICAL DATA:  Cough.  EXAM: CHEST  2 VIEW  COMPARISON:  03/18/2013.  FINDINGS: Low lung volumes. Mild right lung subsegmental atelectatic change with slight scarring persists. Cardiomegaly status post CABG. No active infiltrates or failure. Slight blunting right CP angle. Negative osseous structures.  IMPRESSION: Cardiac enlargement without focal infiltrates or CHF. Low lung volumes.   Electronically Signed   By: Davonna BellingJohn  Curnes M.D.   On: 05/11/2013 10:59   Dg Ankle Complete Right  05/11/2013   CLINICAL DATA:  History of trauma from a fall. Right-sided ankle pain.  EXAM: RIGHT ANKLE - COMPLETE 3+ VIEW  COMPARISON:  None.  FINDINGS: There is no evidence of fracture, dislocation, or joint effusion. There is no evidence of arthropathy or other focal bone abnormality. Soft tissues are unremarkable. Small plantar calcaneal spur incidentally noted.  IMPRESSION: Negative.   Electronically Signed   By: Trudie Reedaniel  Entrikin M.D.   On: 05/11/2013 10:57   Ct Head Wo Contrast  05/11/2013   CLINICAL DATA:  Multiple syncopal episodes in the past few days. History of hypertension.  EXAM: CT  HEAD WITHOUT CONTRAST  TECHNIQUE: Contiguous axial images were obtained from the base of the skull through the vertex without contrast.  COMPARISON:  08/19/2011 MR and 05/29/2009 MR.  FINDINGS: Mild cerebral and cerebellar atrophy. Chronic microvascular ischemic change affects the periventricular and subcortical white matter. Remote lacunar infarcts affect the bilateral basal ganglia. No evidence for acute infarction, hemorrhage, mass lesion, hydrocephalus, or extra-axial fluid. Vascular calcification. No CT signs of proximal vascular thrombosis. Left vertebral dominant. Clear sinuses and mastoids.  Compared with prior studies, a similar appearance is noted.  IMPRESSION: No acute intracranial abnormality.  Chronic changes as described.   Electronically Signed   By: Davonna BellingJohn  Curnes M.D.   On:  05/11/2013 10:36    ECG  EKG shows normal sinus rhythm with normal PR interval and no ischemic changes.  Telemetry: Normal sinus rhythm with intermittent total absence of conduction of multiple P waves with resultant asystole lasting more than 30 seconds. ASSESSMENT AND PLAN  1. syncope secondary to intermittent asystole secondary to multiple nonconducted P waves.  Possible contributory factors could be beta blocker and Aricept.  However his PR interval is normal on resting EKG. 2. ischemic heart disease with previous CABG 2012.  Recent low risk Lexi scan Myoview stress test 03/19/13.  Ejection fraction was 60% with normal wall motion. 3. Dementia 4. hypertension  Plan: Because of his recurrent episodes of profound asystole and resultant syncope, we will proceed with temporary pacemaker today as soon as possible.  We will stop his beta blocker and his Aricept.  I would anticipate we will ask EP to see tomorrow regarding need for permanent pacemaker if no inducible cause of his asystole is found. Signed, Cassell Clementhomas Blonnie Maske, MD  05/11/2013, 2:45 PM

## 2013-05-11 NOTE — ED Notes (Signed)
Pt arrived by [facility] from [facility]. Having syncopal episodes x 2 days, has fallen multiple times over past several days when having syncopal episode. [facility] staff witnessed syncopal episode while sitting down at table after eating breakfast.

## 2013-05-11 NOTE — ED Notes (Signed)
Patient transported to X-ray 

## 2013-05-11 NOTE — Significant Event (Addendum)
Rapid Response Event Note  Overview:  Alerted to patient with periods of CHB - pwaves without QRS per staff Time Called: 1359 (with another patient - MD at bedside - FYI ) Arrival Time: 1433    Initial Focused Assessment:  Alerted to patient - had 19 second period pwaves without QRS and resultant syncope - admitted from ED for syncope.  Dr. Patty SermonsBrackbill on floor with patient and staff.  RRT RN to bedside ASAP - currently with BiPap patient on another floor.     Interventions:  On arrival patient supine in bed.  On Zoll monitor with pads placed.  Currently SR.  Had second run of 28 seconds of CHB prior to my arrival  Dr. Patty SermonsBrackbill present.  Placed on 2 liter nasal cannula.   Assisted with contact of cath team for urgent temporary pacer insertion.  2nd IV line started per VAST team.  Monitored patient until cath team available.  Remains SR - SB without incident of block.  Transported to cath lab with cath lab RN and Dr. Patty SermonsBrackbill.  Handoff to cath lab RN.  Family to waiting area.  Support given   Event Summary: Name of Physician Notified: Dr. Lendell CapriceSullivan at  (pta RRT)  Name of Consulting Physician Notified: Dr. Patty SermonsBrackbill   Dr. Jim LikeVarnasi at  (PTA RRT)  Outcome: Transferred (Comment) (to cath lab then to CCU)  Event End Time: 1530  Delton Prairieebra L Eliese Kerwood

## 2013-05-11 NOTE — ED Notes (Signed)
Pt was started on benazepril this week, pt thinks possible reaction to med?

## 2013-05-11 NOTE — CV Procedure (Signed)
    PROCEDURE:  Temporary transvenous pacemaker placement  INDICATIONS:  Complete heart block  The risks, benefits, and details of the procedure were explained to the patient and family.  The patient verbalized understanding and wanted to proceed.  Informed written consent was obtained.  PROCEDURE TECHNIQUE:  After Xylocaine anesthesia a 43F sheath was placed in the right femoral vein with a single anterior needle wall stick.   A balloon tip temporary pacer was advanced under fluoroscopic guidance and positioned in the right ventricle.  The pacemaker did not capture. The box was changed out and still did not capture. We then removed the temporary pacemaker catheter. We placed another catheter in a similar fashion to the right ventricle. The new temporary pacer was tested and it did capture. Capture was lost at 0.4 milliamps. The pacer was set with an output of 5 milliamps and a heart rate of 50 beats per minute. Resting heart rate during the procedure was 60 beats per minute.   CONTRAST:  Total of 0 cc.  COMPLICATIONS:  None.     IMPRESSIONS:  1. Successful transvenous temporary pacemaker placement via the right femoral vein.  The pacer was tested and capture was lost at 0.4 milliamps. The pacer was set with an output of 5 milliamps and a heart rate of 50 beats per minute. Resting heart rate during the procedure was 60 beats per minute.  RECOMMENDATION:  He'll be monitored in the ICU. He'll need electrophysiology consultation for permanent pacemaker placement.

## 2013-05-11 NOTE — H&P (View-Only) (Signed)
CARDIOLOGY CONSULT NOTE   Patient ID: [name] MRN: [id number], DOB/AGE: [birth year]   Admit date: 05/11/2013 Date of Consult: 05/11/2013   Primary Physician: Lenora Boys, MD Primary Cardiologist: Dr. [name]  Pt. Profile  This 68 year old gentleman with a past history of ischemic heart disease and prior CABG is admitted because of multiple syncopal episodes since yesterday.  Problem List  Past Medical History  Diagnosis Date  . CAD (coronary artery disease)     a. 04/2010 CABG x 5: LIMA->LAD, VG->RI, VG->LCX, VG->RPDA->RPL  . Sleep apnea   . HTN (hypertension)   . Hypercholesteremia   . Multinodular goiter   . Colon polyps   . Pulmonary nodules   . Alzheimer's dementia     a. early.    Past Surgical History  Procedure Laterality Date  . Achilles tendon repair    . Coronary artery bypass grafting x5 with left  05/17/2010     Allergies  No Known Allergies  HPI   This 68 year old gentleman with a past history of ischemic heart disease and prior CABG is admitted because of multiple syncopal episodes since yesterday.  The patient has not been experiencing any recent chest pain.  He had a recent Myoview which was low risk.  Yesterday he has had multiple syncopal episodes.  Since arrival on 3 W. heart unit the patient has had multiple documented episodes of asystole with nothing but nonconducted P waves these episodes have lasted more than 30 seconds and are associated with syncope.  The episodes occur without warning.  The patient has been on Toprol XL 50 mg daily as well as donepezil (Aricept) 10 mg daily.  He has been on both of these for a long time.  Just recently the patient was started on benazepril.  His electrolytes are normal. The patient has a past history of ischemic heart disease and had CABG x5 in April 2012. The patient has a history of Alzheimer's disease and is a resident of [facility].  His family prepares his medication for him.  Inpatient  Medications  . amLODipine  5 mg Oral Daily  . [START ON 05/12/2013] aspirin EC  162 mg Oral Daily  . atorvastatin  20 mg Oral q1800  . enoxaparin (LOVENOX) injection  40 mg Subcutaneous Q24H  . pantoprazole  40 mg Oral Daily  . [START ON 05/12/2013] sertraline  150 mg Oral Daily  . sodium chloride  3 mL Intravenous Q12H    Family History Family History  Problem Relation Age of Onset  . Heart disease Father     no premature CAD.  [name] Heart failure Mother   . Cancer - Other Brother   . Diabetes Brother   . Hypertension Brother      Social History History   Social History  . Marital Status: Married    Spouse Name: N/A    Number of Children: N/A  . Years of Education: N/A   Occupational History  . Not on file.   Social History Main Topics  . Smoking status: Former Games developer  . Smokeless tobacco: Not on file  . Alcohol Use: Yes  . Drug Use: No  . Sexual Activity: Not on file   Other Topics Concern  . Not on file   Social History Narrative   Pt lives in [place].     Review of Systems  General:  No chills, fever, night sweats or weight changes.  Cardiovascular:  No chest pain, dyspnea on exertion, edema,  orthopnea, palpitations, paroxysmal nocturnal dyspnea. Dermatological: No rash, lesions/masses Respiratory: No cough, dyspnea Urologic: No hematuria, dysuria Abdominal:   No nausea, vomiting, diarrhea, bright red blood per rectum, melena, or hematemesis Neurologic:  No visual changes, wkns, changes in mental status. All other systems reviewed and are otherwise negative except as noted above.  Physical Exam  Blood pressure 121/69, pulse 87, temperature 98.7 F (37.1 C), temperature source Oral, resp. rate 18, height 5\' 11"  (1.803 m), weight 231 lb (104.781 kg), SpO2 99.00%.  General: Pleasant, NAD Psych: Normal affect. Neuro: Alert and oriented X 3. Moves all extremities spontaneously. HEENT: Normal  Neck: Supple without bruits or JVD. Lungs:  Resp regular  and unlabored, CTA. Heart: RRR no s3, s4, or murmurs. Abdomen: Soft, non-tender, non-distended, BS + x 4.  Extremities: No clubbing, cyanosis or edema. DP/PT/Radials 2+ and equal bilaterally.  Labs  No results found for this basename: CKTOTAL, CKMB, TROPONINI,  in the last 72 hours Lab Results  Component Value Date   WBC 17.5* 05/11/2013   HGB 15.5 05/11/2013   HCT 43.9 05/11/2013   MCV 88.7 05/11/2013   PLT 239 05/11/2013     Recent Labs Lab 05/11/13 0940  NA 142  K 4.1  CL 105  CO2 21  BUN 26*  CREATININE 0.87  CALCIUM 9.5  PROT 7.7  BILITOT 0.5  ALKPHOS 73  ALT 26  AST 29  GLUCOSE 134*   Lab Results  Component Value Date   CHOL 104 03/19/2013   HDL 41 03/19/2013   LDLCALC 47 03/19/2013   TRIG 81 03/19/2013   No results found for this basename: DDIMER    Radiology/Studies  Dg Chest 2 View  05/11/2013   CLINICAL DATA:  Cough.  EXAM: CHEST  2 VIEW  COMPARISON:  03/18/2013.  FINDINGS: Low lung volumes. Mild right lung subsegmental atelectatic change with slight scarring persists. Cardiomegaly status post CABG. No active infiltrates or failure. Slight blunting right CP angle. Negative osseous structures.  IMPRESSION: Cardiac enlargement without focal infiltrates or CHF. Low lung volumes.   Electronically Signed   By: Davonna BellingJohn  Curnes M.D.   On: 05/11/2013 10:59   Dg Ankle Complete Right  05/11/2013   CLINICAL DATA:  History of trauma from a fall. Right-sided ankle pain.  EXAM: RIGHT ANKLE - COMPLETE 3+ VIEW  COMPARISON:  None.  FINDINGS: There is no evidence of fracture, dislocation, or joint effusion. There is no evidence of arthropathy or other focal bone abnormality. Soft tissues are unremarkable. Small plantar calcaneal spur incidentally noted.  IMPRESSION: Negative.   Electronically Signed   By: Trudie Reedaniel  Entrikin M.D.   On: 05/11/2013 10:57   Ct Head Wo Contrast  05/11/2013   CLINICAL DATA:  Multiple syncopal episodes in the past few days. History of hypertension.  EXAM: CT  HEAD WITHOUT CONTRAST  TECHNIQUE: Contiguous axial images were obtained from the base of the skull through the vertex without contrast.  COMPARISON:  08/19/2011 MR and 05/29/2009 MR.  FINDINGS: Mild cerebral and cerebellar atrophy. Chronic microvascular ischemic change affects the periventricular and subcortical white matter. Remote lacunar infarcts affect the bilateral basal ganglia. No evidence for acute infarction, hemorrhage, mass lesion, hydrocephalus, or extra-axial fluid. Vascular calcification. No CT signs of proximal vascular thrombosis. Left vertebral dominant. Clear sinuses and mastoids.  Compared with prior studies, a similar appearance is noted.  IMPRESSION: No acute intracranial abnormality.  Chronic changes as described.   Electronically Signed   By: Davonna BellingJohn  Curnes M.D.   On:  05/11/2013 10:36    ECG  EKG shows normal sinus rhythm with normal PR interval and no ischemic changes.  Telemetry: Normal sinus rhythm with intermittent total absence of conduction of multiple P waves with resultant asystole lasting more than 30 seconds. ASSESSMENT AND PLAN  1. syncope secondary to intermittent asystole secondary to multiple nonconducted P waves.  Possible contributory factors could be beta blocker and Aricept.  However his PR interval is normal on resting EKG. 2. ischemic heart disease with previous CABG 2012.  Recent low risk Lexi scan Myoview stress test 03/19/13.  Ejection fraction was 60% with normal wall motion. 3. Dementia 4. hypertension  Plan: Because of his recurrent episodes of profound asystole and resultant syncope, we will proceed with temporary pacemaker today as soon as possible.  We will stop his beta blocker and his Aricept.  I would anticipate we will ask EP to see tomorrow regarding need for permanent pacemaker if no inducible cause of his asystole is found. Signed, Cassell Clementhomas Cleaster Shiffer, MD  05/11/2013, 2:45 PM

## 2013-05-11 NOTE — ED Provider Notes (Signed)
CSN: [id number]     Arrival date & time 05/11/13  0845 History   First MD Initiated Contact with Patient 05/11/13 [phone]     Chief Complaint  Patient presents with  . Loss of Consciousness    Patient is a 68 y.o. male presenting with syncope. The history is provided by the patient.  Loss of Consciousness Episode history:  Multiple Most recent episode:  Today Duration:  1 minute Timing:  Sporadic Chronicity:  New Context comment:  Pt had one episode yesterday and was sitting down.  He had another episode while washing in the bathroom this morning.  The third was while sitting at breakfast. Witnessed: yes   Associated symptoms: no chest pain, no confusion, no difficulty breathing, no focal weakness, no headaches, no malaise/fatigue, no nausea, no palpitations, no rectal bleeding, no seizures, no shortness of breath and no vomiting   Risk factors: coronary artery disease   Risk factors: no seizures     Past Medical History  Diagnosis Date  . CAD (coronary artery disease)     a. 04/2010 CABG x 5: LIMA->LAD, VG->RI, VG->LCX, VG->RPDA->RPL  . Sleep apnea   . HTN (hypertension)   . Hypercholesteremia   . Multinodular goiter   . Colon polyps   . Pulmonary nodules   . Alzheimer's dementia     a. early.  . CHB (complete heart block)    Past Surgical History  Procedure Laterality Date  . Achilles tendon repair    . Coronary artery bypass grafting x5 with left  05/17/2010   Family History  Problem Relation Age of Onset  . Heart disease Father     no premature CAD.  [name]. Heart failure Mother   . Cancer - Other Brother   . Diabetes Brother   . Hypertension Brother    History  Substance Use Topics  . Smoking status: Former Games developermoker  . Smokeless tobacco: Not on file  . Alcohol Use: Yes    Review of Systems  Constitutional: Negative for malaise/fatigue.  Respiratory: Negative for shortness of breath.   Cardiovascular: Positive for syncope. Negative for chest pain and palpitations.   Gastrointestinal: Negative for nausea and vomiting.  Neurological: Negative for focal weakness, seizures and headaches.  Psychiatric/Behavioral: Negative for confusion.  All other systems reviewed and are negative.     Allergies  Review of patient's allergies indicates no known allergies.  Home Medications   Prior to Admission medications   Medication Sig Start Date End Date Taking? Authorizing Provider  acetaminophen (TYLENOL) 650 MG CR tablet Take 650 mg by mouth every 8 (eight) hours as needed for pain.    Historical Provider, MD  amLODipine (NORVASC) 5 MG tablet Take 5 mg by mouth daily.    Historical Provider, MD  aspirin 81 MG tablet Take 164 mg by mouth daily.     Historical Provider, MD  co-enzyme Q-10 30 MG capsule Take 30 mg by mouth daily.     Historical Provider, MD  donepezil (ARICEPT) 10 MG tablet Take 10 mg by mouth at bedtime.  10/08/10   Historical Provider, MD  metoprolol (TOPROL-XL) 100 MG 24 hr tablet Take 50 mg by mouth daily.  09/27/10   Historical Provider, MD  nitroGLYCERIN (NITROSTAT) 0.4 MG SL tablet Place 1 tablet (0.4 mg total) under the tongue every 5 (five) minutes x 3 doses as needed for chest pain. 03/19/13   Abelino DerrickLuke K Kilroy, PA-C  omeprazole (PRILOSEC) 20 MG capsule Take 20 mg by mouth daily.  Historical Provider, MD  rosuvastatin (CRESTOR) 40 MG tablet Take 40 mg by mouth daily.    Historical Provider, MD  sertraline (ZOLOFT) 100 MG tablet Take 150 mg by mouth daily. 1 1/2 tablets (150mg ) po every day    Historical Provider, MD   BP 147/83  Pulse 62  Temp(Src) 98.7 F (37.1 C) (Oral)  Resp 18  Ht 5\' 11"  (1.803 m)  Wt 231 lb (104.781 kg)  BMI 32.23 kg/m2  SpO2 96% Physical Exam  Nursing note and vitals reviewed. Constitutional: He is oriented to person, place, and time. He appears well-developed and well-nourished. No distress.  HENT:  Head: Normocephalic and atraumatic.  Right Ear: External ear normal.  Left Ear: External ear normal.   Mouth/Throat: Oropharynx is clear and moist.  Eyes: Conjunctivae are normal. Right eye exhibits no discharge. Left eye exhibits no discharge. No scleral icterus.  Neck: Neck supple. No tracheal deviation present.  Cardiovascular: Normal rate, regular rhythm and intact distal pulses.   Pulmonary/Chest: Effort normal and breath sounds normal. No stridor. No respiratory distress. He has no wheezes. He has no rales.  Abdominal: Soft. Bowel sounds are normal. He exhibits no distension. There is no tenderness. There is no rebound and no guarding.  Musculoskeletal: He exhibits no edema and no tenderness.  Neurological: He is alert and oriented to person, place, and time. He has normal strength. No cranial nerve deficit (No facial droop, extraocular movements intact, tongue midline ) or sensory deficit. He exhibits normal muscle tone. He displays no seizure activity. Coordination normal.  No pronator drift bilateral upper extrem, able to hold both legs off bed for 5 seconds, sensation intact in all extremities, no visual field cuts, no left or right sided neglect, normal finger-nose exam bilaterally, no nystagmus noted   Skin: Skin is warm and dry. No rash noted.  Psychiatric: He has a normal mood and affect.    ED Course  Procedures (including critical care time) Labs Review Labs Reviewed  CBC WITH DIFFERENTIAL - Abnormal; Notable for the following:    WBC 17.5 (*)    Neutrophils Relative % 84 (*)    Neutro Abs 14.7 (*)    Lymphocytes Relative 8 (*)    Monocytes Absolute 1.4 (*)    All other components within normal limits  COMPREHENSIVE METABOLIC PANEL - Abnormal; Notable for the following:    Glucose, Bld 134 (*)    BUN 26 (*)    GFR calc non Af Amer 88 (*)    All other components within normal limits  URINALYSIS, ROUTINE W REFLEX MICROSCOPIC - Abnormal; Notable for the following:    Ketones, ur 15 (*)    All other components within normal limits  CBC WITH DIFFERENTIAL - Abnormal;  Notable for the following:    WBC 11.9 (*)    Neutro Abs 9.1 (*)    Monocytes Absolute 1.1 (*)    All other components within normal limits  CBG MONITORING, ED - Abnormal; Notable for the following:    Glucose-Capillary 127 (*)    All other components within normal limits  MRSA PCR SCREENING  PROTIME-INR  TSH  TROPONIN I  MAGNESIUM  TSH  PROTIME-INR  POCT CBG (FASTING - GLUCOSE)-MANUAL ENTRY   Imaging studies CXR, ankle and headt ct reviewed   EKG Interpretation   Date/Time:  Sunday May 11 2013 08:49:48 EDT Ventricular Rate:  53 PR Interval:  186 QRS Duration: 103 QT Interval:  444 QTC Calculation: 417 R Axis:   23  Text Interpretation:  Sinus rhythm No significant change since last  tracing Confirmed by Ayrabella Labombard  MD-J, Daneli Butkiewicz ([id number]) on 05/11/2013 9:13:24 AM      MDM  Pt presented to the ED after recurrent syncopal episodes.  Normal labs.  EKG with sinus rhythm.  Plan on admission for further evaluation considering his recurrent episodes.   Celene Kras, MD 05/13/13 [phone]

## 2013-05-11 NOTE — ED Notes (Signed)
Dr Lynelle Doctorknapp at bedside to eval pt

## 2013-05-11 NOTE — Progress Notes (Addendum)
Called to bedside for another unresponsive episode. Lasted about a minute. Now alert. On telemetry, 19 second pause. Stop beta blocker and aricept.  Have consulted cardiology stat. Place pacer pads. Transfer to ICU.  While on the unit, had yet another episode with 19 second pause.  Dr. Patty SermonsBrackbill here. Pt going to cath lab for temporary pacemaker, then to be transferred to cardiology service.  Appreciate assistance.  Call [facility] if needed.  Critical care time 40 minutes  Crista Curborinna Jehieli Brassell, M.D. [facility] [phone]

## 2013-05-12 ENCOUNTER — Encounter (HOSPITAL_COMMUNITY)
Admission: EM | Disposition: A | Discharge status: Home Health | Payer: Self-pay | Source: Home / Self Care | Attending: Cardiology

## 2013-05-12 DIAGNOSIS — I379 Nonrheumatic pulmonary valve disorder, unspecified: Secondary | ICD-10-CM | POA: Diagnosis not present

## 2013-05-12 DIAGNOSIS — I469 Cardiac arrest, cause unspecified: Secondary | ICD-10-CM | POA: Diagnosis not present

## 2013-05-12 DIAGNOSIS — R55 Syncope and collapse: Secondary | ICD-10-CM | POA: Diagnosis not present

## 2013-05-12 DIAGNOSIS — F0280 Dementia in other diseases classified elsewhere without behavioral disturbance: Secondary | ICD-10-CM | POA: Diagnosis not present

## 2013-05-12 DIAGNOSIS — I442 Atrioventricular block, complete: Secondary | ICD-10-CM | POA: Diagnosis not present

## 2013-05-12 LAB — CBC WITH DIFFERENTIAL/PLATELET
BASOS PCT: 0 % (ref 0–1)
Basophils Absolute: 0 10*3/uL (ref 0.0–0.1)
Eosinophils Absolute: 0 10*3/uL (ref 0.0–0.7)
Eosinophils Relative: 0 % (ref 0–5)
HEMATOCRIT: 39.7 % (ref 39.0–52.0)
Hemoglobin: 13.4 g/dL (ref 13.0–17.0)
Lymphocytes Relative: 15 % (ref 12–46)
Lymphs Abs: 1.7 10*3/uL (ref 0.7–4.0)
MCH: 30.2 pg (ref 26.0–34.0)
MCHC: 33.8 g/dL (ref 30.0–36.0)
MCV: 89.6 fL (ref 78.0–100.0)
MONO ABS: 1.1 10*3/uL — AB (ref 0.1–1.0)
Monocytes Relative: 9 % (ref 3–12)
Neutro Abs: 9.1 10*3/uL — ABNORMAL HIGH (ref 1.7–7.7)
Neutrophils Relative %: 76 % (ref 43–77)
PLATELETS: 194 10*3/uL (ref 150–400)
RBC: 4.43 MIL/uL (ref 4.22–5.81)
RDW: 13.7 % (ref 11.5–15.5)
WBC: 11.9 10*3/uL — ABNORMAL HIGH (ref 4.0–10.5)

## 2013-05-12 LAB — PROTIME-INR
INR: 1.09 (ref 0.00–1.49)
Prothrombin Time: 13.9 seconds (ref 11.6–15.2)

## 2013-05-12 LAB — TSH: TSH: 2.55 u[IU]/mL (ref 0.350–4.500)

## 2013-05-12 SURGERY — PERMANENT PACEMAKER INSERTION
Anesthesia: LOCAL

## 2013-05-12 NOTE — Care Management Note (Addendum)
    Page 1 of 1   05/14/2013     3:45:44 PM CARE MANAGEMENT NOTE 05/14/2013  Patient:  [name],[name]   Account Number:  [id number]  Date Initiated:  05/12/2013  Documentation initiated by:  Junius CreamerWELL,DEBBIE  Subjective/Objective Assessment:   adm w syncope     Action/Plan:   lives w wife, pcp dr Vernell Morgansrober freid   Anticipated DC Date:     Anticipated DC Plan:        DC Planning Services  CM consult      [facility]   Choice offered to / List presented to:  C-1 Patient        HH arranged  HH-1 RN  HH-2 PT  HH-6 SOCIAL WORKER      HH agency  [facility]   Status of service:   Medicare Important Message given?   (If response is "NO", the following Medicare IM given date fields will be blank) Date Medicare IM given:   Date Additional Medicare IM given:    Discharge Disposition:  HOME W HOME HEALTH SERVICES  Per UR Regulation:  Reviewed for med. necessity/level of care/duration of stay  If discussed at Long Length of Stay Meetings, dates discussed:    Comments:  4/22 1543 debbie Connor Ortman rn,bsn spoke w pt and wife. gave wife hhc agency list. no pref to hhc agency. [facility] likes to use [facility]. ref to [name] w [facility] for hhrn-hhsw-hhpt. pt w dementia and will ask [facility] to call wife donna [phone] to sched appts.

## 2013-05-12 NOTE — Consult Note (Signed)
ELECTROPHYSIOLOGY CONSULT NOTE   Patient ID: Connor PernaJohn J Bence MRN: [id number], DOB/AGE: 31-May-1945   Admit date: 05/11/2013 Date of Consult: 05/12/2013  Primary Physician: Marinda Elkobert Fried, MD Primary Cardiologist: Verdis PrimeHenry Smith, MD Reason for Consultation: Complete heart block  History of Present Illness [name] is a 68 y.o. male with known CAD s/p CABG 2012, HTN, dyslipidemia, OSA and mild dementia who presented yesterday with syncope. [name] reports intermittent dizziness x 1 week. He denies alleviating or aggravating factors or any correlation to positional/postural changes. Over the last 24 hours he has experienced 3 episodes of frank syncope, prompting his admission. While here he experienced recurrent syncope with documented complete heart block with asystole due to multiple consecutive nonconducted P waves on telemetry, requiring temporary pacing wire placement. Potassium 4.1. Magnesium 2.3. Troponin negative. TSH normal. Head CT negative for intracranial abnormality. ECG on admission shows sinus bradycardia with normal intervals and no ST-T wave abnormalities. EP has been asked to see for recommendations regarding permanent pacemaker implantation. [name] denies any recent trouble with CP or SOB. He denies palpitations and has no history of cardiac arrhythmias. He denies LE swelling, orthopnea or PND. He was taking metoprolol succinate prior to admission which has been held now for 24 hours. He is currently in SR, conducting 1:1 without recurrent CHB.   Past Medical History Past Medical History  Diagnosis Date  . CAD (coronary artery disease)     a. 04/2010 CABG x 5: LIMA->LAD, VG->RI, VG->LCX, VG->RPDA->RPL  . Sleep apnea   . HTN (hypertension)   . Hypercholesteremia   . Multinodular goiter   . Colon polyps   . Pulmonary nodules   . Alzheimer's dementia     a. early.  . CHB (complete heart block)     Past Surgical History Past Surgical History  Procedure Laterality Date    . Achilles tendon repair    . Coronary artery bypass grafting x5 with left  05/17/2010    Allergies/Intolerances No Known Allergies  Current Home Medications      acetaminophen 650 MG CR tablet  Commonly known as:  TYLENOL  Take 650 mg by mouth 2 (two) times daily.     amLODipine 5 MG tablet  Commonly known as:  NORVASC  Take 5 mg by mouth every morning.     aspirin 81 MG tablet  Take 164 mg by mouth every morning.     benazepril 10 MG tablet  Commonly known as:  LOTENSIN  Take 10 mg by mouth every morning.     co-enzyme Q-10 30 MG capsule  Take 30 mg by mouth daily.     donepezil 10 MG tablet  Commonly known as:  ARICEPT  Take 10 mg by mouth every morning.     metoprolol succinate 100 MG 24 hr tablet  Commonly known as:  TOPROL-XL  Take 50 mg by mouth every morning.     nitroGLYCERIN 0.4 MG SL tablet  Commonly known as:  NITROSTAT  Place 1 tablet (0.4 mg total) under the tongue every 5 (five) minutes x 3 doses as needed for chest pain.     omeprazole 20 MG capsule  Commonly known as:  PRILOSEC  Take 20 mg by mouth every morning.     rosuvastatin 40 MG tablet  Commonly known as:  CRESTOR  Take 40 mg by mouth every morning.     sertraline 100 MG tablet  Commonly known as:  ZOLOFT  Take 150 mg by mouth every evening. 1  1/2 tablets (150mg ) po every day     Inpatient Medications . amLODipine  5 mg Oral Daily  . aspirin EC  162 mg Oral Daily  . atorvastatin  20 mg Oral q1800  . enoxaparin (LOVENOX) injection  40 mg Subcutaneous Q24H  . pantoprazole  40 mg Oral Daily  . sertraline  150 mg Oral Daily  . sodium chloride  3 mL Intravenous Q12H   . sodium chloride 1,000 mL (05/11/13 2203)    Family History Family History  Problem Relation Age of Onset  . Heart disease Father     no premature CAD.  [name] Heart failure Mother   . Cancer - Other Brother   . Diabetes Brother   . Hypertension Brother      Social History Social History  . Marital Status:  Married   Social History Main Topics  . Smoking status: Former Games developer  . Smokeless tobacco: No  . Alcohol Use: Yes  . Drug Use: No   Social History Narrative   Pt lives in [place].    Review of Systems General: No chills, fever, night sweats or weight changes  Cardiovascular:  No chest pain, dyspnea on exertion, edema, orthopnea, palpitations, paroxysmal nocturnal dyspnea Dermatological: No rash, lesions or masses Respiratory: No cough, dyspnea Urologic: No hematuria, dysuria Abdominal: No nausea, vomiting, diarrhea, bright red blood per rectum, melena, or hematemesis Neurologic: No visual changes, weakness All other systems reviewed and are otherwise negative except as noted above.  Physical Exam Vitals: Blood pressure 126/79, pulse 60, temperature 98.9 F (37.2 C), temperature source Oral, resp. rate 20, height 5\' 11"  (1.803 m), weight 231 lb (104.781 kg), SpO2 97.00%.  General: Well developed, well appearing 68 y.o. male in no acute distress. HEENT: Normocephalic, atraumatic. EOMs intact. Sclera nonicteric. Oropharynx clear.  Neck: Supple. No JVD. Lungs: Respirations regular and unlabored, CTA bilaterally. No wheezes, rales or rhonchi. Heart: RRR. S1, S2 present. No murmurs, rub, S3 or S4. Abdomen: Soft, non-tender, non-distended. BS present x 4 quadrants. No hepatosplenomegaly.  Extremities: No clubbing, cyanosis or edema. DP/PT/Radials 2+ and equal bilaterally. Psych: Normal affect. Neuro: Alert and oriented X 3. Moves all extremities spontaneously. Musculoskeletal: No kyphosis. Skin: Intact. Warm and dry. No rashes or petechiae in exposed areas.   Labs  Recent Labs  05/11/13 1430  TROPONINI <0.30   Lab Results  Component Value Date   WBC 11.9* 05/12/2013   HGB 13.4 05/12/2013   HCT 39.7 05/12/2013   MCV 89.6 05/12/2013   PLT 194 05/12/2013    Recent Labs Lab 05/11/13 0940  NA 142  K 4.1  CL 105  CO2 21  BUN 26*  CREATININE 0.87  CALCIUM 9.5  PROT  7.7  BILITOT 0.5  ALKPHOS 73  ALT 26  AST 29  GLUCOSE 134*    Recent Labs  05/12/13 0235  TSH 2.550    Recent Labs  05/12/13 0430  INR 1.09    Radiology/Studies Dg Chest 2 View  05/11/2013   CLINICAL DATA:  Cough.  EXAM: CHEST  2 VIEW  COMPARISON:  03/18/2013.  FINDINGS: Low lung volumes. Mild right lung subsegmental atelectatic change with slight scarring persists. Cardiomegaly status post CABG. No active infiltrates or failure. Slight blunting right CP angle. Negative osseous structures.  IMPRESSION: Cardiac enlargement without focal infiltrates or CHF. Low lung volumes.   Electronically Signed   By: Davonna Belling M.D.   On: 05/11/2013 10:59   Dg Ankle Complete Right  05/11/2013   CLINICAL  DATA:  History of trauma from a fall. Right-sided ankle pain.  EXAM: RIGHT ANKLE - COMPLETE 3+ VIEW  COMPARISON:  None.  FINDINGS: There is no evidence of fracture, dislocation, or joint effusion. There is no evidence of arthropathy or other focal bone abnormality. Soft tissues are unremarkable. Small plantar calcaneal spur incidentally noted.  IMPRESSION: Negative.   Electronically Signed   By: Trudie Reed M.D.   On: 05/11/2013 10:57   Ct Head Wo Contrast  05/11/2013   CLINICAL DATA:  Multiple syncopal episodes in the past few days. History of hypertension.  EXAM: CT HEAD WITHOUT CONTRAST  TECHNIQUE: Contiguous axial images were obtained from the base of the skull through the vertex without contrast.  COMPARISON:  08/19/2011 MR and 05/29/2009 MR.  FINDINGS: Mild cerebral and cerebellar atrophy. Chronic microvascular ischemic change affects the periventricular and subcortical white matter. Remote lacunar infarcts affect the bilateral basal ganglia. No evidence for acute infarction, hemorrhage, mass lesion, hydrocephalus, or extra-axial fluid. Vascular calcification. No CT signs of proximal vascular thrombosis. Left vertebral dominant. Clear sinuses and mastoids.  Compared with prior studies, a  similar appearance is noted.  IMPRESSION: No acute intracranial abnormality.  Chronic changes as described.   Electronically Signed   By: Davonna Belling M.D.   On: 05/11/2013 10:36    Recent LexiScan Myoview Feb 2015 FINDINGS: ECG: No ischemic changes with Lexiscan infusion. Symptoms: Short of breath Quantitiative Gated SPECT EF: 68% with normal wall motion. Perfusion Images: There was a small, mid basal inferior and inferoseptal perfusion defect seen both at rest and with Lexiscan stress. The remainder of the wall segments showed normal perfusion. IMPRESSION: 1. Low risk study with a fixed, small mild basal inferior and inferoseptal perfusion defect. I suspect this is diaphragmatic attenuation given normal wall motion. 2. Normal LV systolic function and wall motion.  Echocardiogram  EF 55-60%  12-lead ECG on admission - sinus brady at 53 bpm with normal intervals and no ST-T wave abnormalities Telemetry reviewed in full - SR currently with 1:1 AV conduction in upper 50s; overnight had transient complete heart block with asystole ~20 seconds due to multiple consecutive nonconducted P waves   Assessment and Plan 1. Complete heart block 2. Normal LVEF 3. CAD s/p CABG 4. Dementia, mild  [name] presents with complete heart block that is hemodynamically unstable requiring placement of temporary pacing wire. He was taking metoprolol succinate prior to admission which has been discontinued. He has normal LVEF and narrow QRS. His rhythm has stabilized and he is currently in SR with 1:1 AV conduction in upper 50s. For now, close monitoring in CCU on telemetry off BB with temporary pacing support and lower rate of 30 bpm. If has recurrent CHB despite discontinuation of BB, will need PPM. This was discussed with [name] and his wife.    SignedMinda Meo, PA-C 05/12/2013, 7:46 AM   EP Attending  Patient seen and examined. I have edited the above note as needed. I agree with the  findings as noted by Rick Duff, PA-C. He has syncope and heart block with long pauses. The finding of a normal QRS duration and PR interval would suggest that he has beta blocker induced bradycardia vs autonomic dysfunction rather than progressive conduction system disease. I would observe off of telemetry and hold off on PPM unless he has more bradycardia 48 hours off of beta blocker. If no bradycardia, his temporary PM can be removed tomorrow and he can be discharged home tomorrow  afternoon.  Leonia ReevesGregg Taylor,M.D.

## 2013-05-12 NOTE — Progress Notes (Signed)
  Echocardiogram 2D Echocardiogram has been performed.  [name] 05/12/2013, 11:38 AM

## 2013-05-12 NOTE — Progress Notes (Signed)
05/12/2013 1414  Brady event with TVP pacing 12 beats then return to regular rhythm. Pt felt sweaty, then was OK. Reported to [place] PA. Montrae Braithwaite Burnett Laurann Mcmorris

## 2013-05-12 NOTE — Progress Notes (Signed)
05/12/2013 8:23 AM Pacer TVP rate turned down to 30  Aflac Incorporatedose Burnett Sherena Machorro

## 2013-05-13 DIAGNOSIS — R55 Syncope and collapse: Secondary | ICD-10-CM | POA: Diagnosis not present

## 2013-05-13 NOTE — Progress Notes (Signed)
Pt received breakfast this morning and pt's family reports that per MD this AM, no PM will be placed today - reviewed note and have paged World Fuel Services CorporationBrooke Edmisten PA-C to confirm to have diet order resumed.  PA confirmed to resume heart healthy diet today and to make pt NPO at midnight tonight per phone order.  Orders read back and confirmed.  Orders placed in EPIC per protocol.    Pt currently interacting appropriately with wife and son who remain bedside.  Educated pt and family the importance of pt not getting up without assistance and to keep the RLE straight d/t sheath placement.  Pt and family verbalize understanding and have no questions or concerns at this time.  Bed alarm remains in place.  Will continue to closely monitor.

## 2013-05-13 NOTE — Progress Notes (Signed)
Patient: Connor PernaJohn J Poole Date of Encounter: 05/13/2013, 6:59 AM Admit date: 05/11/2013     Subjective  [name] has no complaints this AM.    Objective  Physical Exam: Vitals: BP 141/88  Pulse 53  Temp(Src) 98.2 F (36.8 C) (Oral)  Resp 21  Ht 5\' 11"  (1.803 m)  Wt 231 lb (104.781 kg)  BMI 32.23 kg/m2  SpO2 98% General: Well developed, well appearing 68 year old male in no acute distress. Neck: Supple. JVD not elevated. Lungs: Clear bilaterally to auscultation without wheezes, rales, or rhonchi. Breathing is unlabored. Heart: RRR S1 S2 without murmurs, rubs, or gallops.  Abdomen: Soft, non-distended. Extremities: No clubbing or cyanosis. No edema.  Distal pedal pulses are 2+ and equal bilaterally. Neuro: Alert and oriented X 3. Moves all extremities spontaneously. No focal deficits.  Intake/Output:  Intake/Output Summary (Last 24 hours) at 05/13/13 0659 Last data filed at 05/13/13 0500  Gross per 24 hour  Intake   1050 ml  Output   2101 ml  Net  -1051 ml    Inpatient Medications:  . amLODipine  5 mg Oral Daily  . aspirin EC  162 mg Oral Daily  . atorvastatin  20 mg Oral q1800  . enoxaparin (LOVENOX) injection  40 mg Subcutaneous Q24H  . pantoprazole  40 mg Oral Daily  . sertraline  150 mg Oral Daily  . sodium chloride  3 mL Intravenous Q12H   . sodium chloride 1,000 mL (05/12/13 0800)    Labs:  Recent Labs  05/11/13 0940 05/11/13 1430  NA 142  --   K 4.1  --   CL 105  --   CO2 21  --   GLUCOSE 134*  --   BUN 26*  --   CREATININE 0.87  --   CALCIUM 9.5  --   MG  --  2.3    Recent Labs  05/11/13 0940  AST 29  ALT 26  ALKPHOS 73  BILITOT 0.5  PROT 7.7  ALBUMIN 4.1    Recent Labs  05/11/13 0940 05/12/13 0430  WBC 17.5* 11.9*  NEUTROABS 14.7* 9.1*  HGB 15.5 13.4  HCT 43.9 39.7  MCV 88.7 89.6  PLT 239 194    Recent Labs  05/11/13 1430  TROPONINI <0.30    Recent Labs  05/12/13 0235  TSH 2.550    Recent Labs   05/12/13 0430  INR 1.09    Radiology/Studies: Dg Chest 2 View  05/11/2013   CLINICAL DATA:  Cough.  EXAM: CHEST  2 VIEW  COMPARISON:  03/18/2013.  FINDINGS: Low lung volumes. Mild right lung subsegmental atelectatic change with slight scarring persists. Cardiomegaly status post CABG. No active infiltrates or failure. Slight blunting right CP angle. Negative osseous structures.  IMPRESSION: Cardiac enlargement without focal infiltrates or CHF. Low lung volumes.   Electronically Signed   By: Davonna BellingJohn  Curnes M.D.   On: 05/11/2013 10:59   Ct Head Wo Contrast  05/11/2013   CLINICAL DATA:  Multiple syncopal episodes in the past few days. History of hypertension.  EXAM: CT HEAD WITHOUT CONTRAST  TECHNIQUE: Contiguous axial images were obtained from the base of the skull through the vertex without contrast.  COMPARISON:  08/19/2011 MR and 05/29/2009 MR.  FINDINGS: Mild cerebral and cerebellar atrophy. Chronic microvascular ischemic change affects the periventricular and subcortical white matter. Remote lacunar infarcts affect the bilateral basal ganglia. No evidence for acute infarction, hemorrhage, mass lesion, hydrocephalus, or extra-axial fluid. Vascular calcification. No CT  signs of proximal vascular thrombosis. Left vertebral dominant. Clear sinuses and mastoids.  Compared with prior studies, a similar appearance is noted.  IMPRESSION: No acute intracranial abnormality.  Chronic changes as described.   Electronically Signed   By: Davonna BellingJohn  Curnes M.D.   On: 05/11/2013 10:36    Recent LexiScan Myoview Feb 2015  FINDINGS: ECG: No ischemic changes with Lexiscan infusion. Symptoms: Short of breath Quantitiative Gated SPECT EF: 68% with normal wall motion. Perfusion Images: There was a small, mid basal inferior and inferoseptal perfusion defect seen both at rest and with Lexiscan stress. The remainder of the wall segments showed normal perfusion. IMPRESSION: 1. Low risk study with a fixed, small mild basal  inferior and inferoseptal perfusion defect. I suspect this is diaphragmatic attenuation given normal wall motion. 2. Normal LV systolic function and wall motion.  Echocardiogram  EF 55-60%   12-lead ECG on admission - sinus brady at 53 bpm with normal intervals and no ST-T wave abnormalities  Telemetry reviewed in full - SR currently with 1:1 AV conduction in upper 50s; yesterday afternoon ~2:15 PM had transient complete heart block and V paced at 30 bpm briefly  Assessment and Plan  1. Complete heart block  - no recurrence since 2:15 PM yesterday - continue to monitor off BB 2. Normal LVEF  3. CAD s/p CABG  4. Dementia, mild  Signed, Minda MeoBrooke O Edmisten PA-C  EP Attending  Patient seen and examined. I discussed issues with the patient and his wife. If he has additional pacing at the lower rate of 30/min today, then he will require PPM (assuming his temp pm is sensing appropriately). If no pacing today, then we can assume his bradycardia was due to his beta blocker and he can be discharged tomorrow off of metoprolol and back on Aricept. [name],M.D.

## 2013-05-13 NOTE — Progress Notes (Signed)
Patient declined to wear CPAP tonight.  Patient is aware to have RN call if he changes his mind.

## 2013-05-13 NOTE — Progress Notes (Signed)
Pt had episode of confusion and tried to get out of bed; he woke up disoriented to person, situation and place; Pt was quickly reoriented, but refuses to wear his CPAP; Pt currently calm and V/S remain stable; Nurse will continue to monitor pt and use bed alarm.

## 2013-05-14 ENCOUNTER — Encounter (HOSPITAL_COMMUNITY): Payer: Self-pay | Admitting: Cardiology

## 2013-05-14 DIAGNOSIS — R55 Syncope and collapse: Secondary | ICD-10-CM | POA: Diagnosis not present

## 2013-05-14 MED ORDER — ASPIRIN 81 MG PO TABS: 162.0000 mg | ORAL_TABLET | ORAL | Status: DC

## 2013-05-14 NOTE — Evaluation (Signed)
Physical Therapy Evaluation and Discharge Patient Details Name: [name] MRN: [id number] DOB: 1945-06-04 Today's Date: 05/14/2013   History of Present Illness  [name] is a 68 y.o. male admitted s/p several episodes of syncope. Some with LOC, some with staring episodes. PMH significant for alzheimers disease, HTN, CAD. From an  ALF (per chart - family states independent living).   Clinical Impression  Patient evaluated by Physical Therapy with no further acute PT needs identified. All education has been completed and the patient has no further questions. Recommend HHPT for continued strengthening, and to improve balance and tolerance for functional activity. See below for any follow-up Physial Therapy or equipment needs. PT is signing off. Thank you for this referral.     Follow Up Recommendations Home health PT;Supervision for mobility/OOB    Equipment Recommendations  None recommended by PT    Recommendations for Other Services       Precautions / Restrictions Precautions Precautions: Fall Restrictions Weight Bearing Restrictions: No      Mobility  Bed Mobility               General bed mobility comments: Pt received sitting up in recliner  Transfers Overall transfer level: Needs assistance Equipment used: None Transfers: Sit to/from Stand Sit to Stand: Modified independent (Device/Increase time)         General transfer comment: No physical assist required - increased time to achieve full standing without use of hands.   Ambulation/Gait Ambulation/Gait assistance: Supervision Ambulation Distance (Feet): 150 Feet Assistive device: None Gait Pattern/deviations: Step-through pattern;Decreased stride length Gait velocity: Decreased Gait velocity interpretation: Below normal speed for age/gender General Gait Details: No unsteadiness or LOB noted. Therapist guarding pt but no physical assist was required.   Stairs            Wheelchair  Mobility    Modified Rankin (Stroke Patients Only)       Balance Overall balance assessment: History of Falls                               Standardized Balance Assessment Standardized Balance Assessment : Berg Balance Test Berg Balance Test Sit to Stand: Able to stand  independently using hands Standing Unsupported: Able to stand 2 minutes with supervision Sitting with Back Unsupported but Feet Supported on Floor or Stool: Able to sit safely and securely 2 minutes Stand to Sit: Sits safely with minimal use of hands Transfers: Able to transfer safely, minor use of hands Standing Unsupported with Eyes Closed: Able to stand 10 seconds with supervision Standing Ubsupported with Feet Together: Able to place feet together independently and stand for 1 minute with supervision From Standing, Reach Forward with Outstretched Arm: Can reach forward >12 cm safely (5") From Standing Position, Pick up Object from Floor: Able to pick up shoe, needs supervision From Standing Position, Turn to Look Behind Over each Shoulder: Looks behind one side only/other side shows less weight shift Turn 360 Degrees: Able to turn 360 degrees safely one side only in 4 seconds or less Standing Unsupported, Alternately Place Feet on Step/Stool: Able to stand independently and complete 8 steps >20 seconds Standing Unsupported, One Foot in Front: Loses balance while stepping or standing Standing on One Leg: Able to lift leg independently and hold equal to or more than 3 seconds Total Score: 41         Pertinent Vitals/Pain Pt reports some fatigue with ambulation,  however vital signs were [facility] for tasks being assessed.    Home Living Family/patient expects to be discharged to:: Other (Comment) ([facility] per family)               Home Equipment: Gilford Rile - 2 wheels;Cane - single point      Prior Function Level of Independence: Independent               Hand Dominance    Dominant Hand: Right    Extremity/Trunk Assessment   Upper Extremity Assessment: Defer to OT evaluation           Lower Extremity Assessment: Generalized weakness      Cervical / Trunk Assessment: Normal  Communication   Communication: No difficulties  Cognition Arousal/Alertness: Awake/alert Behavior During Therapy: WFL for tasks assessed/performed Overall Cognitive Status: History of cognitive impairments - at baseline                      General Comments      Exercises        Assessment/Plan    PT Assessment All further PT needs can be met in the next venue of care  PT Diagnosis Generalized weakness   PT Problem List Decreased strength;Decreased activity tolerance;Decreased balance;Decreased safety awareness  PT Treatment Interventions     PT Goals (Current goals can be found in the Care Plan section) Acute Rehab PT Goals Patient Stated Goal: To return to PLOF PT Goal Formulation: No goals set, d/c therapy    Frequency     Barriers to discharge        Co-evaluation               End of Session Equipment Utilized During Treatment: Gait belt Activity Tolerance: Patient tolerated treatment well Patient left: in chair;with call bell/phone within reach;with family/visitor present Nurse Communication: Mobility status         Time: 3762-8315 PT Time Calculation (min): 16 min   Charges:   PT Evaluation $Initial PT Evaluation Tier I: 1 Procedure     PT G CodesJolyn Lent 05/14/2013, 5:24 PM  Jolyn Lent, PT, DPT Acute Rehabilitation Services Pager: [phone]

## 2013-05-14 NOTE — Discharge Summary (Signed)
ELECTROPHYSIOLOGY DISCHARGE SUMMARY   Patient ID: [name],  MRN: [id number], DOB/AGE: 67-Jul-1947 67 y.o.  Admit date: 05/11/2013 Discharge date: 05/14/2013  Primary Care Physician: Marinda Elkobert Fried, MD Primary Cardiologist: Verdis PrimeHenry Smith, MD Primary EP: Lewayne BuntingGregg Taylor, MD  Primary Discharge Diagnosis:  1. Complete heart block, transient, resolved off BB  Secondary Discharge Diagnoses:  1. CAD s/p CABG 2. Dementia 3. HTN 4. OSA 5. Dyslipidemia  Procedures This Admission:  1. Transvenous temporary pacemaker placement 05/11/2013 2. 2D echocardiogram - Left ventricle:  The cavity size was normal. Wall thickness was increased in a pattern of mild LVH. Systolic function was normal. The estimated ejection fraction was in the range of 55% to 60%. Doppler parameters are consistent with abnormal left ventricular relaxation (grade 1 diastolic dysfunction). - Aortic valve:   Mildly thickened leaflets.  Doppler:   No regurgitation. - Mitral valve:   Structurally normal valve.   Leaflet separation was normal.  Doppler:  Transvalvular velocity was within the normal range. There was no evidence for stenosis. Trivial regurgitation. - Left atrium:  The atrium was mildly dilated. - Right ventricle:  The cavity size was normal. Wall thickness was normal. Systolic function was normal. - Pulmonic valve:    Structurally normal valve.   Cusp separation was normal.  Doppler:  Transvalvular velocity was within the normal range.  Mild regurgitation. - Tricuspid valve:   Structurally normal valve.   Leaflet separation was normal.  Doppler:  Transvalvular velocity was within the normal range.  Trivial regurgitation. - Right atrium:  The atrium was normal in size. - Pericardium:  There was no pericardial effusion.  History and Hospital Course:  [name] is a 68 year old man with known CAD s/p CABG 2012, HTN, dyslipidemia, OSA and dementia who presented yesterday with syncope. [name] has  had intermittent dizziness x 1 week prior to admission. He denied alleviating or aggravating factors or any correlation to positional/postural changes. In the 24 hours prior to admission he experienced 3 episodes of frank syncope, prompting him to seek emergent care. He was admitted for close observation. While here he experienced recurrent syncope with documented complete heart block with asystole due to multiple consecutive nonconducted P waves on telemetry, requiring temporary pacing wire placement. Potassium 4.1. Magnesium 2.3. Troponin negative. TSH normal. Head CT negative for intracranial abnormality. ECG on admission showed sinus bradycardia with normal intervals and no ST-T wave abnormalities. [name] denied any recent trouble with CP or SOB. He denied palpitations and had no prior history of cardiac arrhythmias. He denied LE swelling, orthopnea or PND. He was taking metoprolol succinate prior to admission which was discontinued. He was evaluated by EP, Dr. Ladona Ridgelaylor. He remained off all AV nodal blocking / rate slowing medications and observed for 48 hours without recurrent CHB. Temporary pacing wire was removed. He is currently in SR, conducting 1:1 without recurrent CHB. He remains hemodynamically stable. He is ambulating without difficulty. He has been seen, examined and deemed stable for discharge home today by Dr. Lewayne BuntingGregg Taylor.   Discharge Vitals: Blood pressure 155/94, pulse 82, temperature 98 F (36.7 C), temperature source Oral, resp. rate 20, height 5\' 11"  (1.803 m), weight 231 lb (104.781 kg), SpO2 97.00%.   Labs: Lab Results  Component Value Date   WBC 11.9* 05/12/2013   HGB 13.4 05/12/2013   HCT 39.7 05/12/2013   MCV 89.6 05/12/2013   PLT 194 05/12/2013     Recent Labs Lab 05/11/13 0940  NA 142  K 4.1  CL 105  CO2 21  BUN 26*  CREATININE 0.87  CALCIUM 9.5  PROT 7.7  BILITOT 0.5  ALKPHOS 73  ALT 26  AST 29  GLUCOSE 134*   Lab Results  Component Value Date   CKTOTAL  265* 10/15/2009   CKMB 12.6 CRITICAL VALUE NOTED.  VALUE IS CONSISTENT WITH PREVIOUSLY REPORTED AND CALLED VALUE.* 10/15/2009   TROPONINI <0.30 05/11/2013     Disposition:  The patient is being discharged in stable condition.  Follow-up:     Follow-up Information   Follow up with Lewayne BuntingGregg Taylor, MD On 06/05/2013. (At 9:15 AM for hospital follow-up and 12-lead ECG)    Specialty:  Cardiology   Contact information:   [address] [place] [address] [phone]       Follow up with Lesleigh NoeSMITH III,HENRY W, MD On 08/08/2013. (At 9:30 AM)    Specialty:  Cardiology   Contact information:   [address] [place] [address] [phone]      Discharge Medications:    Medication List    STOP taking these medications       metoprolol succinate 100 MG 24 hr tablet  Commonly known as:  TOPROL-XL      TAKE these medications       acetaminophen 650 MG CR tablet  Commonly known as:  TYLENOL  Take 650 mg by mouth 2 (two) times daily.     amLODipine 5 MG tablet  Commonly known as:  NORVASC  Take 5 mg by mouth every morning.     aspirin 81 MG tablet  Take 2 tablets (162 mg total) by mouth every morning.     benazepril 10 MG tablet  Commonly known as:  LOTENSIN  Take 10 mg by mouth every morning.     co-enzyme Q-10 30 MG capsule  Take 30 mg by mouth daily.     donepezil 10 MG tablet  Commonly known as:  ARICEPT  Take 10 mg by mouth every morning.     nitroGLYCERIN 0.4 MG SL tablet  Commonly known as:  NITROSTAT  Place 1 tablet (0.4 mg total) under the tongue every 5 (five) minutes x 3 doses as needed for chest pain.     omeprazole 20 MG capsule  Commonly known as:  PRILOSEC  Take 20 mg by mouth every morning.     rosuvastatin 40 MG tablet  Commonly known as:  CRESTOR  Take 40 mg by mouth every morning.     sertraline 100 MG tablet  Commonly known as:  ZOLOFT  Take 150 mg by mouth every evening. 1 1/2 tablets (150mg ) po every day        Duration of Discharge Encounter: Greater than 30 minutes including physician time.  Jorja LoaSigned, Deion Swift O Hiep Ollis, PA-C 05/15/2013, 11:44 PM

## 2013-05-14 NOTE — Progress Notes (Signed)
Pt discharging home; family had wanted to speak with a Child psychotherapistsocial worker about higher level of care needs, if pt were deemed appropriate for higher level of care. RNCM has arranged for [facility] home social [facility] to [facility] on [facility]. CSW signing off.   Maryclare LabradorJulie Joyia Riehle, MSW, [facility] SystemCSWA Clinical Social Worker [phone]

## 2013-05-14 NOTE — Progress Notes (Signed)
Patient: Connor PernaJohn J Mogel Date of Encounter: 05/14/2013, 5:31 AM Admit date: 05/11/2013     Subjective  [name] has no complaints this AM.    Objective  Physical Exam: Vitals: BP 140/90  Pulse 66  Temp(Src) 98.6 F (37 C) (Oral)  Resp 16  Ht 5\' 11"  (1.803 m)  Wt 231 lb (104.781 kg)  BMI 32.23 kg/m2  SpO2 98% General: Well developed, well appearing 68 year old male in no acute distress. Neck: Supple. JVD not elevated. Lungs: Clear bilaterally to auscultation without wheezes, rales, or rhonchi. Breathing is unlabored. Heart: RRR S1 S2 without murmurs, rubs, or gallops.  Abdomen: Soft, non-distended. Extremities: No clubbing or cyanosis. No edema.  Distal pedal pulses are 2+ and equal bilaterally. Neuro: Alert and oriented X 3. Moves all extremities spontaneously. No focal deficits.  Intake/Output:  Intake/Output Summary (Last 24 hours) at 05/14/13 0531 Last data filed at 05/14/13 0500  Gross per 24 hour  Intake   1040 ml  Output   1825 ml  Net   -785 ml    Inpatient Medications:  . amLODipine  5 mg Oral Daily  . aspirin EC  162 mg Oral Daily  . atorvastatin  20 mg Oral q1800  . enoxaparin (LOVENOX) injection  40 mg Subcutaneous Q24H  . pantoprazole  40 mg Oral Daily  . sertraline  150 mg Oral Daily  . sodium chloride  3 mL Intravenous Q12H   . sodium chloride 1,000 mL (05/12/13 0800)    Labs:  Recent Labs  05/11/13 0940 05/11/13 1430  NA 142  --   K 4.1  --   CL 105  --   CO2 21  --   GLUCOSE 134*  --   BUN 26*  --   CREATININE 0.87  --   CALCIUM 9.5  --   MG  --  2.3    Recent Labs  05/11/13 0940  AST 29  ALT 26  ALKPHOS 73  BILITOT 0.5  PROT 7.7  ALBUMIN 4.1    Recent Labs  05/11/13 0940 05/12/13 0430  WBC 17.5* 11.9*  NEUTROABS 14.7* 9.1*  HGB 15.5 13.4  HCT 43.9 39.7  MCV 88.7 89.6  PLT 239 194    Recent Labs  05/11/13 1430  TROPONINI <0.30    Recent Labs  05/12/13 0235  TSH 2.550    Recent Labs  05/12/13 0430    INR 1.09    Radiology/Studies: Dg Chest 2 View  05/11/2013   CLINICAL DATA:  Cough.  EXAM: CHEST  2 VIEW  COMPARISON:  03/18/2013.  FINDINGS: Low lung volumes. Mild right lung subsegmental atelectatic change with slight scarring persists. Cardiomegaly status post CABG. No active infiltrates or failure. Slight blunting right CP angle. Negative osseous structures.  IMPRESSION: Cardiac enlargement without focal infiltrates or CHF. Low lung volumes.   Electronically Signed   By: Davonna BellingJohn  Curnes M.D.   On: 05/11/2013 10:59   Ct Head Wo Contrast  05/11/2013   CLINICAL DATA:  Multiple syncopal episodes in the past few days. History of hypertension.  EXAM: CT HEAD WITHOUT CONTRAST  TECHNIQUE: Contiguous axial images were obtained from the base of the skull through the vertex without contrast.  COMPARISON:  08/19/2011 MR and 05/29/2009 MR.  FINDINGS: Mild cerebral and cerebellar atrophy. Chronic microvascular ischemic change affects the periventricular and subcortical white matter. Remote lacunar infarcts affect the bilateral basal ganglia. No evidence for acute infarction, hemorrhage, mass lesion, hydrocephalus, or extra-axial fluid. Vascular calcification.  No CT signs of proximal vascular thrombosis. Left vertebral dominant. Clear sinuses and mastoids.  Compared with prior studies, a similar appearance is noted.  IMPRESSION: No acute intracranial abnormality.  Chronic changes as described.   Electronically Signed   By: Davonna BellingJohn  Curnes M.D.   On: 05/11/2013 10:36    Recent LexiScan Myoview Feb 2015  FINDINGS: ECG: No ischemic changes with Lexiscan infusion. Symptoms: Short of breath Quantitiative Gated SPECT EF: 68% with normal wall motion. Perfusion Images: There was a small, mid basal inferior and inferoseptal perfusion defect seen both at rest and with Lexiscan stress. The remainder of the wall segments showed normal perfusion. IMPRESSION: 1. Low risk study with a fixed, small mild basal inferior  and inferoseptal perfusion defect. I suspect this is diaphragmatic attenuation given normal wall motion. 2. Normal LV systolic function and wall motion.  Echocardiogram  EF 55-60%   12-lead ECG on admission - sinus brady at 53 bpm with normal intervals and no ST-T wave abnormalities  Telemetry reviewed in full - SR currently with 1:1 AV conduction in upper 50s; no further AV block since 05/12/2013 at 2:15 PM  Assessment and Plan  1. Complete heart block  - no recurrence in >24 hours - discontinue temp pacing wire - up with assistance 2. Normal LVEF  3. CAD s/p CABG  4. Dementia  Signed, Minda MeoBrooke O Edmisten PA-C  EP attending  Patient seen and examined. Agree with above. He is ok for discharge home from the hospital, off of all AV nodal and sinus nodal blocking drugs.   Leonia ReevesGregg Sevan Mcbroom,M.D.

## 2013-05-14 NOTE — Progress Notes (Signed)
Patient discharged to home [facility] with wife via wheelchair.  PIVs x2 discontinued w/pressure dressings applied to sites.  Telemetry monitor discontinued and CCMD and Elink notified.  Patient belongings including bilateral hearing aides with patient and in ears, wife indicated she had his watch.  No medications in pharmacy per wife.  Patient awake, alert and oriented at time of discharge.

## 2013-05-14 NOTE — Discharge Instructions (Signed)
[facility] [phone] hhrn, hhpt, hhsw

## 2013-05-17 DIAGNOSIS — I10 Essential (primary) hypertension: Secondary | ICD-10-CM | POA: Diagnosis not present

## 2013-05-17 DIAGNOSIS — F0280 Dementia in other diseases classified elsewhere without behavioral disturbance: Secondary | ICD-10-CM | POA: Diagnosis not present

## 2013-05-17 DIAGNOSIS — I4891 Unspecified atrial fibrillation: Secondary | ICD-10-CM | POA: Diagnosis not present

## 2013-05-17 DIAGNOSIS — G309 Alzheimer's disease, unspecified: Secondary | ICD-10-CM | POA: Diagnosis not present

## 2013-05-17 DIAGNOSIS — I2510 Atherosclerotic heart disease of native coronary artery without angina pectoris: Secondary | ICD-10-CM | POA: Diagnosis not present

## 2013-05-17 DIAGNOSIS — Z48812 Encounter for surgical aftercare following surgery on the circulatory system: Secondary | ICD-10-CM | POA: Diagnosis not present

## 2013-05-17 DIAGNOSIS — R269 Unspecified abnormalities of gait and mobility: Secondary | ICD-10-CM | POA: Diagnosis not present

## 2013-05-20 DIAGNOSIS — R269 Unspecified abnormalities of gait and mobility: Secondary | ICD-10-CM | POA: Diagnosis not present

## 2013-05-20 DIAGNOSIS — Z48812 Encounter for surgical aftercare following surgery on the circulatory system: Secondary | ICD-10-CM | POA: Diagnosis not present

## 2013-05-20 DIAGNOSIS — I4891 Unspecified atrial fibrillation: Secondary | ICD-10-CM | POA: Diagnosis not present

## 2013-05-20 DIAGNOSIS — F0280 Dementia in other diseases classified elsewhere without behavioral disturbance: Secondary | ICD-10-CM | POA: Diagnosis not present

## 2013-05-20 DIAGNOSIS — G309 Alzheimer's disease, unspecified: Secondary | ICD-10-CM | POA: Diagnosis not present

## 2013-05-20 DIAGNOSIS — I2510 Atherosclerotic heart disease of native coronary artery without angina pectoris: Secondary | ICD-10-CM | POA: Diagnosis not present

## 2013-05-20 DIAGNOSIS — I10 Essential (primary) hypertension: Secondary | ICD-10-CM | POA: Diagnosis not present

## 2013-05-21 ENCOUNTER — Telehealth: Payer: Self-pay | Admitting: Interventional Cardiology

## 2013-05-21 NOTE — Telephone Encounter (Signed)
New message      Need verbal order to see pt twice a week for 3wks with start of care date 05-20-13 for high level balance activities, strengthening, gait training and activity modification.  Based on Dr Patty SermonsBrackbill inpatient orders.  Just call and say OK.

## 2013-05-22 DIAGNOSIS — I10 Essential (primary) hypertension: Secondary | ICD-10-CM | POA: Diagnosis not present

## 2013-05-22 DIAGNOSIS — G309 Alzheimer's disease, unspecified: Secondary | ICD-10-CM | POA: Diagnosis not present

## 2013-05-22 DIAGNOSIS — Z48812 Encounter for surgical aftercare following surgery on the circulatory system: Secondary | ICD-10-CM | POA: Diagnosis not present

## 2013-05-22 DIAGNOSIS — I4891 Unspecified atrial fibrillation: Secondary | ICD-10-CM | POA: Diagnosis not present

## 2013-05-22 DIAGNOSIS — I2510 Atherosclerotic heart disease of native coronary artery without angina pectoris: Secondary | ICD-10-CM | POA: Diagnosis not present

## 2013-05-22 DIAGNOSIS — R269 Unspecified abnormalities of gait and mobility: Secondary | ICD-10-CM | POA: Diagnosis not present

## 2013-05-22 DIAGNOSIS — F0280 Dementia in other diseases classified elsewhere without behavioral disturbance: Secondary | ICD-10-CM | POA: Diagnosis not present

## 2013-05-22 NOTE — Telephone Encounter (Signed)
returned [name] an [name] call.she sts that this is an intial care plan after evaluation and not an rqst to increase visits.adv her I woulfd fwd a message to Dr.Smith for verbal ok and call back. she verbalized understanding.

## 2013-05-22 NOTE — Telephone Encounter (Signed)
This is okay.

## 2013-05-22 NOTE — Telephone Encounter (Signed)
[facility]Smith verbal ok

## 2013-05-26 DIAGNOSIS — Z48812 Encounter for surgical aftercare following surgery on the circulatory system: Secondary | ICD-10-CM | POA: Diagnosis not present

## 2013-05-26 DIAGNOSIS — G309 Alzheimer's disease, unspecified: Secondary | ICD-10-CM | POA: Diagnosis not present

## 2013-05-26 DIAGNOSIS — I4891 Unspecified atrial fibrillation: Secondary | ICD-10-CM | POA: Diagnosis not present

## 2013-05-26 DIAGNOSIS — I10 Essential (primary) hypertension: Secondary | ICD-10-CM | POA: Diagnosis not present

## 2013-05-26 DIAGNOSIS — F0280 Dementia in other diseases classified elsewhere without behavioral disturbance: Secondary | ICD-10-CM | POA: Diagnosis not present

## 2013-05-26 DIAGNOSIS — R269 Unspecified abnormalities of gait and mobility: Secondary | ICD-10-CM | POA: Diagnosis not present

## 2013-05-26 DIAGNOSIS — I2510 Atherosclerotic heart disease of native coronary artery without angina pectoris: Secondary | ICD-10-CM | POA: Diagnosis not present

## 2013-05-28 DIAGNOSIS — I2510 Atherosclerotic heart disease of native coronary artery without angina pectoris: Secondary | ICD-10-CM | POA: Diagnosis not present

## 2013-05-28 DIAGNOSIS — F0280 Dementia in other diseases classified elsewhere without behavioral disturbance: Secondary | ICD-10-CM | POA: Diagnosis not present

## 2013-05-28 DIAGNOSIS — R269 Unspecified abnormalities of gait and mobility: Secondary | ICD-10-CM | POA: Diagnosis not present

## 2013-05-28 DIAGNOSIS — Z48812 Encounter for surgical aftercare following surgery on the circulatory system: Secondary | ICD-10-CM | POA: Diagnosis not present

## 2013-05-28 DIAGNOSIS — I10 Essential (primary) hypertension: Secondary | ICD-10-CM | POA: Diagnosis not present

## 2013-05-28 DIAGNOSIS — I4891 Unspecified atrial fibrillation: Secondary | ICD-10-CM | POA: Diagnosis not present

## 2013-05-30 DIAGNOSIS — I10 Essential (primary) hypertension: Secondary | ICD-10-CM | POA: Diagnosis not present

## 2013-05-30 DIAGNOSIS — F0280 Dementia in other diseases classified elsewhere without behavioral disturbance: Secondary | ICD-10-CM | POA: Diagnosis not present

## 2013-05-30 DIAGNOSIS — I4891 Unspecified atrial fibrillation: Secondary | ICD-10-CM | POA: Diagnosis not present

## 2013-05-30 DIAGNOSIS — Z48812 Encounter for surgical aftercare following surgery on the circulatory system: Secondary | ICD-10-CM | POA: Diagnosis not present

## 2013-05-30 DIAGNOSIS — I2510 Atherosclerotic heart disease of native coronary artery without angina pectoris: Secondary | ICD-10-CM | POA: Diagnosis not present

## 2013-05-30 DIAGNOSIS — G309 Alzheimer's disease, unspecified: Secondary | ICD-10-CM | POA: Diagnosis not present

## 2013-05-30 DIAGNOSIS — R269 Unspecified abnormalities of gait and mobility: Secondary | ICD-10-CM | POA: Diagnosis not present

## 2013-06-02 ENCOUNTER — Telehealth: Payer: Self-pay | Admitting: Interventional Cardiology

## 2013-06-02 NOTE — Telephone Encounter (Signed)
New message         Randa EvensJoanne is asking if pt qualifies for care through medicare for 2 more weeks / pt was walking 250 ft incline bp went up to 184/104 and it took 8 minutes for it to come done to 160/90 / pt is also having high bp when he is at rest / pt does not recognize when he is sob that he needs to slow down.

## 2013-06-04 DIAGNOSIS — Z48812 Encounter for surgical aftercare following surgery on the circulatory system: Secondary | ICD-10-CM | POA: Diagnosis not present

## 2013-06-04 DIAGNOSIS — I10 Essential (primary) hypertension: Secondary | ICD-10-CM | POA: Diagnosis not present

## 2013-06-04 DIAGNOSIS — G309 Alzheimer's disease, unspecified: Secondary | ICD-10-CM | POA: Diagnosis not present

## 2013-06-04 DIAGNOSIS — I2510 Atherosclerotic heart disease of native coronary artery without angina pectoris: Secondary | ICD-10-CM | POA: Diagnosis not present

## 2013-06-04 DIAGNOSIS — F0280 Dementia in other diseases classified elsewhere without behavioral disturbance: Secondary | ICD-10-CM | POA: Diagnosis not present

## 2013-06-04 DIAGNOSIS — R269 Unspecified abnormalities of gait and mobility: Secondary | ICD-10-CM | POA: Diagnosis not present

## 2013-06-04 DIAGNOSIS — I4891 Unspecified atrial fibrillation: Secondary | ICD-10-CM | POA: Diagnosis not present

## 2013-06-05 ENCOUNTER — Encounter: Payer: Self-pay | Admitting: Internal Medicine

## 2013-06-05 ENCOUNTER — Ambulatory Visit (INDEPENDENT_AMBULATORY_CARE_PROVIDER_SITE_OTHER): Payer: Medicare Other | Admitting: Internal Medicine

## 2013-06-05 ENCOUNTER — Telehealth: Payer: Self-pay | Admitting: Interventional Cardiology

## 2013-06-05 VITALS — BP 148/82 | HR 69 | Ht 71.0 in | Wt 237.0 lb

## 2013-06-05 DIAGNOSIS — I2510 Atherosclerotic heart disease of native coronary artery without angina pectoris: Secondary | ICD-10-CM | POA: Diagnosis not present

## 2013-06-05 DIAGNOSIS — R0989 Other specified symptoms and signs involving the circulatory and respiratory systems: Secondary | ICD-10-CM

## 2013-06-05 DIAGNOSIS — I10 Essential (primary) hypertension: Secondary | ICD-10-CM

## 2013-06-05 DIAGNOSIS — R0609 Other forms of dyspnea: Secondary | ICD-10-CM

## 2013-06-05 DIAGNOSIS — R0600 Dyspnea, unspecified: Secondary | ICD-10-CM

## 2013-06-05 MED ORDER — FUROSEMIDE 40 MG PO TABS: 40.0000 mg | ORAL_TABLET | Freq: Every day | ORAL | Status: DC

## 2013-06-05 NOTE — Assessment & Plan Note (Signed)
He has rales on exam and I will start low dose lasix today. Will check labs in a week.

## 2013-06-05 NOTE — Progress Notes (Signed)
HPI [name] returns today for followup. He is a pleasant middle aged man with dementia who presented with CHB several weeks ago. The patient had his beta blocker stopped and conduction improved. He has done well since then except for sob with exertion. No edema. No syncope.  Allergies  Allergen Reactions  . Metoprolol Other (See Comments)    bradycardia / complete heart block     Current Outpatient Prescriptions  Medication Sig Dispense Refill  . acetaminophen (TYLENOL) 650 MG CR tablet Take 650 mg by mouth 2 (two) times daily.       [name]. amLODipine (NORVASC) 5 MG tablet Take 5 mg by mouth every morning.       [name]. aspirin 81 MG tablet Take 2 tablets (162 mg total) by mouth every morning.  30 tablet    . benazepril (LOTENSIN) 10 MG tablet Take 10 mg by mouth every morning.      [name]. co-enzyme Q-10 30 MG capsule Take 30 mg by mouth daily.       [name]. donepezil (ARICEPT) 10 MG tablet Take 10 mg by mouth every morning.       . nitroGLYCERIN (NITROSTAT) 0.4 MG SL tablet Place 1 tablet (0.4 mg total) under the tongue every 5 (five) minutes x 3 doses as needed for chest pain.  25 tablet  2  . omeprazole (PRILOSEC) 20 MG capsule Take 20 mg by mouth every morning.       . rosuvastatin (CRESTOR) 40 MG tablet Take 40 mg by mouth every morning.       . sertraline (ZOLOFT) 100 MG tablet Take 150 mg by mouth every evening.        No current facility-administered medications for this visit.     Past Medical History  Diagnosis Date  . OSA (obstructive sleep apnea)   . Essential hypertension, benign   . Hypercholesteremia   . Multinodular goiter   . Colon polyps   . Pulmonary nodules   . Alzheimer's dementia     a. early.  . CHB (complete heart block)     resolved off metoprolol  . Low back pain   . Other and unspecified hyperlipidemia   . Depressive disorder, not elsewhere classified   . Anxiety state, unspecified   . Esophageal reflux   . Pain in joint, shoulder region     Currently  resolved off statin therapy  . Cold intolerance   . Myalgia   . Vitamin D deficiency   . Memory loss   . Lesion of lung     Stable and felt inflammatory  . Obesity (BMI 30-39.9)   . CAD (coronary artery disease)     CAD with prior PCI. a. 04/2010 CABG x 5: LIMA->LAD, VG->RI, VG->LCX, VG->RPDA->RPL  . Coronary atherosclerosis of native coronary artery     ROS:   All systems reviewed and negative except as noted in the HPI.   Past Surgical History  Procedure Laterality Date  . Achilles tendon repair    . Coronary artery bypass grafting x5 with left  05/17/2010    CABG x 5 with LIMA to LAD, SVG to RI, SVG to Cfx and seq. SVG to PDA and PLOM  . Coronary angioplasty with stent placement  02/1998     Family History  Problem Relation Age of Onset  . Heart disease Father     no premature CAD.  [name]. CAD Father   . Congestive Heart Failure Mother   . Diabetes  Mother     DM  . Cancer - Other Brother   . Diabetes Brother     DM  . Hypertension Brother   . CAD Brother   . Heart murmur Sister   . Diabetes Maternal Grandmother     DM  . Heart attack Paternal Grandfather   . CAD Paternal Grandfather      History   Social History  . Marital Status: Married    Spouse Name: N/A    Number of Children: N/A  . Years of Education: N/A   Occupational History  . Not on file.   Social History Main Topics  . Smoking status: Former Smoker -- 15.00 packs/day    Types: Cigarettes    Quit date: 01/24/1979  . Smokeless tobacco: Not on file  . Alcohol Use: Yes     Comment: 7-10 drinks per week  . Drug Use: No  . Sexual Activity: Not on file   Other Topics Concern  . Not on file   Social History Narrative   [place]     BP 148/82  Pulse 69  Ht 5\' 11"  (1.803 m)  Wt 237 lb (107.502 kg)  BMI 33.07 kg/m2  Physical Exam:  stable appearing 68 yo man, NAD HEENT: Unremarkable Neck:  No JVD, no thyromegally Back:  No CVA tenderness Lungs:  Clear with no  wheezes HEART:  Regular rate rhythm, no murmurs, no rubs, no clicks Abd:  soft, positive bowel sounds, no organomegally, no rebound, no guarding Ext:  2 plus pulses, no edema, no cyanosis, no clubbing Skin:  No rashes no nodules Neuro:  CN II through XII intact, motor grossly intact  EKG - NSR   Assess/Plan:

## 2013-06-05 NOTE — Telephone Encounter (Signed)
New problem    Need orders for pt to have dementia assessment for speech therapy. And orders for pulse oximeter during exercise.

## 2013-06-05 NOTE — Patient Instructions (Addendum)
Your physician wants you to follow-up in: 6 months with Dr Marlou StarksSmith You will receive a reminder letter in the mail two months in advance. If you don't receive a letter, please call our office to schedule the follow-up appointment.  Your physician has recommended you make the following change in your medication:  1) Start Furosemide 40mg  daily  Your physician recommends that you return for lab work next week:  BMP

## 2013-06-05 NOTE — Assessment & Plan Note (Signed)
His conduction has improved and he has not had any recurrent syncope. He will undergo a period of watchful waiting. No indication for PPM at this time.

## 2013-06-06 DIAGNOSIS — I4891 Unspecified atrial fibrillation: Secondary | ICD-10-CM | POA: Diagnosis not present

## 2013-06-06 DIAGNOSIS — F0280 Dementia in other diseases classified elsewhere without behavioral disturbance: Secondary | ICD-10-CM | POA: Diagnosis not present

## 2013-06-06 DIAGNOSIS — R269 Unspecified abnormalities of gait and mobility: Secondary | ICD-10-CM | POA: Diagnosis not present

## 2013-06-06 DIAGNOSIS — I2510 Atherosclerotic heart disease of native coronary artery without angina pectoris: Secondary | ICD-10-CM | POA: Diagnosis not present

## 2013-06-06 DIAGNOSIS — Z48812 Encounter for surgical aftercare following surgery on the circulatory system: Secondary | ICD-10-CM | POA: Diagnosis not present

## 2013-06-06 DIAGNOSIS — I10 Essential (primary) hypertension: Secondary | ICD-10-CM | POA: Diagnosis not present

## 2013-06-09 DIAGNOSIS — F0280 Dementia in other diseases classified elsewhere without behavioral disturbance: Secondary | ICD-10-CM | POA: Diagnosis not present

## 2013-06-09 DIAGNOSIS — Z48812 Encounter for surgical aftercare following surgery on the circulatory system: Secondary | ICD-10-CM | POA: Diagnosis not present

## 2013-06-09 DIAGNOSIS — I2510 Atherosclerotic heart disease of native coronary artery without angina pectoris: Secondary | ICD-10-CM | POA: Diagnosis not present

## 2013-06-09 DIAGNOSIS — R269 Unspecified abnormalities of gait and mobility: Secondary | ICD-10-CM | POA: Diagnosis not present

## 2013-06-09 DIAGNOSIS — I4891 Unspecified atrial fibrillation: Secondary | ICD-10-CM | POA: Diagnosis not present

## 2013-06-09 DIAGNOSIS — G309 Alzheimer's disease, unspecified: Secondary | ICD-10-CM | POA: Diagnosis not present

## 2013-06-09 DIAGNOSIS — I10 Essential (primary) hypertension: Secondary | ICD-10-CM | POA: Diagnosis not present

## 2013-06-11 DIAGNOSIS — I2510 Atherosclerotic heart disease of native coronary artery without angina pectoris: Secondary | ICD-10-CM | POA: Diagnosis not present

## 2013-06-11 DIAGNOSIS — I4891 Unspecified atrial fibrillation: Secondary | ICD-10-CM | POA: Diagnosis not present

## 2013-06-11 DIAGNOSIS — R269 Unspecified abnormalities of gait and mobility: Secondary | ICD-10-CM | POA: Diagnosis not present

## 2013-06-11 DIAGNOSIS — I10 Essential (primary) hypertension: Secondary | ICD-10-CM | POA: Diagnosis not present

## 2013-06-11 DIAGNOSIS — G309 Alzheimer's disease, unspecified: Secondary | ICD-10-CM | POA: Diagnosis not present

## 2013-06-11 DIAGNOSIS — F0280 Dementia in other diseases classified elsewhere without behavioral disturbance: Secondary | ICD-10-CM | POA: Diagnosis not present

## 2013-06-11 DIAGNOSIS — Z48812 Encounter for surgical aftercare following surgery on the circulatory system: Secondary | ICD-10-CM | POA: Diagnosis not present

## 2013-06-13 DIAGNOSIS — Z48812 Encounter for surgical aftercare following surgery on the circulatory system: Secondary | ICD-10-CM | POA: Diagnosis not present

## 2013-06-13 DIAGNOSIS — R269 Unspecified abnormalities of gait and mobility: Secondary | ICD-10-CM | POA: Diagnosis not present

## 2013-06-13 DIAGNOSIS — I10 Essential (primary) hypertension: Secondary | ICD-10-CM | POA: Diagnosis not present

## 2013-06-13 DIAGNOSIS — F0280 Dementia in other diseases classified elsewhere without behavioral disturbance: Secondary | ICD-10-CM | POA: Diagnosis not present

## 2013-06-13 DIAGNOSIS — I4891 Unspecified atrial fibrillation: Secondary | ICD-10-CM | POA: Diagnosis not present

## 2013-06-13 DIAGNOSIS — I2510 Atherosclerotic heart disease of native coronary artery without angina pectoris: Secondary | ICD-10-CM | POA: Diagnosis not present

## 2013-06-17 DIAGNOSIS — R269 Unspecified abnormalities of gait and mobility: Secondary | ICD-10-CM | POA: Diagnosis not present

## 2013-06-17 DIAGNOSIS — I4891 Unspecified atrial fibrillation: Secondary | ICD-10-CM | POA: Diagnosis not present

## 2013-06-17 DIAGNOSIS — G309 Alzheimer's disease, unspecified: Secondary | ICD-10-CM | POA: Diagnosis not present

## 2013-06-17 DIAGNOSIS — I10 Essential (primary) hypertension: Secondary | ICD-10-CM | POA: Diagnosis not present

## 2013-06-17 DIAGNOSIS — F0280 Dementia in other diseases classified elsewhere without behavioral disturbance: Secondary | ICD-10-CM | POA: Diagnosis not present

## 2013-06-17 DIAGNOSIS — Z48812 Encounter for surgical aftercare following surgery on the circulatory system: Secondary | ICD-10-CM | POA: Diagnosis not present

## 2013-06-17 DIAGNOSIS — I2510 Atherosclerotic heart disease of native coronary artery without angina pectoris: Secondary | ICD-10-CM | POA: Diagnosis not present

## 2013-06-17 NOTE — Telephone Encounter (Signed)
Will route to Dr.Smith for approval 

## 2013-06-20 DIAGNOSIS — I10 Essential (primary) hypertension: Secondary | ICD-10-CM | POA: Diagnosis not present

## 2013-06-20 DIAGNOSIS — R269 Unspecified abnormalities of gait and mobility: Secondary | ICD-10-CM | POA: Diagnosis not present

## 2013-06-20 DIAGNOSIS — I2510 Atherosclerotic heart disease of native coronary artery without angina pectoris: Secondary | ICD-10-CM | POA: Diagnosis not present

## 2013-06-20 DIAGNOSIS — F0280 Dementia in other diseases classified elsewhere without behavioral disturbance: Secondary | ICD-10-CM | POA: Diagnosis not present

## 2013-06-20 DIAGNOSIS — I4891 Unspecified atrial fibrillation: Secondary | ICD-10-CM | POA: Diagnosis not present

## 2013-06-20 DIAGNOSIS — G309 Alzheimer's disease, unspecified: Secondary | ICD-10-CM | POA: Diagnosis not present

## 2013-06-20 DIAGNOSIS — Z48812 Encounter for surgical aftercare following surgery on the circulatory system: Secondary | ICD-10-CM | POA: Diagnosis not present

## 2013-06-23 ENCOUNTER — Other Ambulatory Visit: Payer: Medicare Other

## 2013-06-23 ENCOUNTER — Inpatient Hospital Stay (HOSPITAL_COMMUNITY)
Admission: EM | Admit: 2013-06-23 | Discharge: 2013-06-25 | DRG: 243 | Disposition: A | Discharge status: Home / Self Care | Payer: Medicare Other | Attending: Cardiovascular Disease | Admitting: Cardiovascular Disease

## 2013-06-23 ENCOUNTER — Encounter (HOSPITAL_COMMUNITY): Payer: Self-pay | Admitting: Emergency Medicine

## 2013-06-23 ENCOUNTER — Emergency Department (HOSPITAL_COMMUNITY): Payer: Medicare Other

## 2013-06-23 DIAGNOSIS — F329 Major depressive disorder, single episode, unspecified: Secondary | ICD-10-CM | POA: Diagnosis present

## 2013-06-23 DIAGNOSIS — R404 Transient alteration of awareness: Secondary | ICD-10-CM | POA: Diagnosis not present

## 2013-06-23 DIAGNOSIS — F411 Generalized anxiety disorder: Secondary | ICD-10-CM | POA: Diagnosis present

## 2013-06-23 DIAGNOSIS — E785 Hyperlipidemia, unspecified: Secondary | ICD-10-CM | POA: Diagnosis present

## 2013-06-23 DIAGNOSIS — R079 Chest pain, unspecified: Secondary | ICD-10-CM | POA: Diagnosis not present

## 2013-06-23 DIAGNOSIS — I4891 Unspecified atrial fibrillation: Secondary | ICD-10-CM | POA: Diagnosis not present

## 2013-06-23 DIAGNOSIS — Z87891 Personal history of nicotine dependence: Secondary | ICD-10-CM | POA: Diagnosis not present

## 2013-06-23 DIAGNOSIS — F0280 Dementia in other diseases classified elsewhere without behavioral disturbance: Secondary | ICD-10-CM

## 2013-06-23 DIAGNOSIS — Z951 Presence of aortocoronary bypass graft: Secondary | ICD-10-CM

## 2013-06-23 DIAGNOSIS — F3289 Other specified depressive episodes: Secondary | ICD-10-CM | POA: Diagnosis present

## 2013-06-23 DIAGNOSIS — I441 Atrioventricular block, second degree: Secondary | ICD-10-CM | POA: Diagnosis not present

## 2013-06-23 DIAGNOSIS — E7800 Pure hypercholesterolemia, unspecified: Secondary | ICD-10-CM | POA: Diagnosis present

## 2013-06-23 DIAGNOSIS — Z7982 Long term (current) use of aspirin: Secondary | ICD-10-CM

## 2013-06-23 DIAGNOSIS — E042 Nontoxic multinodular goiter: Secondary | ICD-10-CM | POA: Diagnosis present

## 2013-06-23 DIAGNOSIS — Z6832 Body mass index (BMI) 32.0-32.9, adult: Secondary | ICD-10-CM

## 2013-06-23 DIAGNOSIS — R0609 Other forms of dyspnea: Secondary | ICD-10-CM

## 2013-06-23 DIAGNOSIS — IMO0001 Reserved for inherently not codable concepts without codable children: Secondary | ICD-10-CM | POA: Diagnosis present

## 2013-06-23 DIAGNOSIS — R0989 Other specified symptoms and signs involving the circulatory and respiratory systems: Secondary | ICD-10-CM

## 2013-06-23 DIAGNOSIS — Z9861 Coronary angioplasty status: Secondary | ICD-10-CM | POA: Diagnosis not present

## 2013-06-23 DIAGNOSIS — E669 Obesity, unspecified: Secondary | ICD-10-CM | POA: Diagnosis present

## 2013-06-23 DIAGNOSIS — K219 Gastro-esophageal reflux disease without esophagitis: Secondary | ICD-10-CM | POA: Diagnosis present

## 2013-06-23 DIAGNOSIS — I455 Other specified heart block: Secondary | ICD-10-CM

## 2013-06-23 DIAGNOSIS — E559 Vitamin D deficiency, unspecified: Secondary | ICD-10-CM | POA: Diagnosis present

## 2013-06-23 DIAGNOSIS — I442 Atrioventricular block, complete: Principal | ICD-10-CM

## 2013-06-23 DIAGNOSIS — I10 Essential (primary) hypertension: Secondary | ICD-10-CM | POA: Diagnosis not present

## 2013-06-23 DIAGNOSIS — G4733 Obstructive sleep apnea (adult) (pediatric): Secondary | ICD-10-CM | POA: Diagnosis present

## 2013-06-23 DIAGNOSIS — I2510 Atherosclerotic heart disease of native coronary artery without angina pectoris: Secondary | ICD-10-CM | POA: Diagnosis present

## 2013-06-23 DIAGNOSIS — J9819 Other pulmonary collapse: Secondary | ICD-10-CM | POA: Diagnosis present

## 2013-06-23 DIAGNOSIS — I25810 Atherosclerosis of coronary artery bypass graft(s) without angina pectoris: Secondary | ICD-10-CM

## 2013-06-23 DIAGNOSIS — R55 Syncope and collapse: Secondary | ICD-10-CM

## 2013-06-23 DIAGNOSIS — G309 Alzheimer's disease, unspecified: Secondary | ICD-10-CM | POA: Diagnosis present

## 2013-06-23 DIAGNOSIS — R269 Unspecified abnormalities of gait and mobility: Secondary | ICD-10-CM | POA: Diagnosis not present

## 2013-06-23 DIAGNOSIS — R0600 Dyspnea, unspecified: Secondary | ICD-10-CM

## 2013-06-23 DIAGNOSIS — Z48812 Encounter for surgical aftercare following surgery on the circulatory system: Secondary | ICD-10-CM | POA: Diagnosis not present

## 2013-06-23 LAB — CBC WITH DIFFERENTIAL/PLATELET
BASOS ABS: 0 10*3/uL (ref 0.0–0.1)
BASOS PCT: 0 % (ref 0–1)
EOS PCT: 2 % (ref 0–5)
Eosinophils Absolute: 0.2 10*3/uL (ref 0.0–0.7)
HCT: 40.6 % (ref 39.0–52.0)
Hemoglobin: 14.3 g/dL (ref 13.0–17.0)
LYMPHS PCT: 17 % (ref 12–46)
Lymphs Abs: 1.6 10*3/uL (ref 0.7–4.0)
MCH: 31.1 pg (ref 26.0–34.0)
MCHC: 35.2 g/dL (ref 30.0–36.0)
MCV: 88.3 fL (ref 78.0–100.0)
Monocytes Absolute: 0.7 10*3/uL (ref 0.1–1.0)
Monocytes Relative: 8 % (ref 3–12)
Neutro Abs: 6.7 10*3/uL (ref 1.7–7.7)
Neutrophils Relative %: 73 % (ref 43–77)
PLATELETS: 212 10*3/uL (ref 150–400)
RBC: 4.6 MIL/uL (ref 4.22–5.81)
RDW: 13.7 % (ref 11.5–15.5)
WBC: 9.3 10*3/uL (ref 4.0–10.5)

## 2013-06-23 LAB — COMPREHENSIVE METABOLIC PANEL
ALBUMIN: 4 g/dL (ref 3.5–5.2)
ALT: 21 U/L (ref 0–53)
AST: 25 U/L (ref 0–37)
Alkaline Phosphatase: 79 U/L (ref 39–117)
BUN: 22 mg/dL (ref 6–23)
CO2: 23 meq/L (ref 19–32)
Calcium: 9.4 mg/dL (ref 8.4–10.5)
Chloride: 105 mEq/L (ref 96–112)
Creatinine, Ser: 0.86 mg/dL (ref 0.50–1.35)
GFR calc Af Amer: >90 mL/min (ref >90)
GFR calc non Af Amer: 88 mL/min — ABNORMAL LOW (ref >90)
Glucose, Bld: 111 mg/dL — ABNORMAL HIGH (ref 70–99)
Potassium: 4.1 mEq/L (ref 3.7–5.3)
SODIUM: 141 meq/L (ref 137–147)
Total Bilirubin: 0.3 mg/dL (ref 0.3–1.2)
Total Protein: 7.2 g/dL (ref 6.0–8.3)

## 2013-06-23 LAB — PROTIME-INR
INR: 1.02 (ref 0.00–1.49)
Prothrombin Time: 13.2 seconds (ref 11.6–15.2)

## 2013-06-23 LAB — TROPONIN I: Troponin I: <0.3 ng/mL (ref <0.30)

## 2013-06-23 NOTE — H&P (Signed)
The patient was seen and examined, and I agree with the assessment and plan as documented above, with modifications as noted below. 68 yr old male with PMH as noted above (CABG, CHB while on metoprolol) presents today with witnessed "syncopal" episode. Pt experienced sensation of feeling flushed and diaphoretic beforehand. Denies antecedent dizziness or chest pain. He and his wife say "heart beat was irregular but normalized by the time EMS came".  Since being in ED, no sinus pauses or arrhythmias noted. Will admit for continued observation. If he demonstrates recurrence of complete heart block, will need pacemaker. Otherwise, consider outpatient event monitor.

## 2013-06-23 NOTE — ED Notes (Signed)
MD at bedside, pt comfortable

## 2013-06-23 NOTE — ED Notes (Signed)
Attempted report 

## 2013-06-23 NOTE — ED Provider Notes (Signed)
CSN: [id number]     Arrival date & time 06/23/13  1659 History   First MD Initiated Contact with Patient 06/23/13 1708     Chief Complaint  Patient presents with  . Loss of Consciousness     (Consider location/radiation/quality/duration/timing/severity/associated sxs/prior Treatment) HPI Comments: Patient arrives by EMS after syncopal episode while sitting watching TV. Patient states he began to feel sweaty and exiting the nose there are people standing around him. He did not feel like he is going to pass out. History is limited due to his dementia. Denies any chest pain or shortness of breath. Notably he had an admission in April for multiple syncopal episodes in the setting of a heart block and was taken off metoprolol with resolution. He did not get a pacemaker so he had a temporary wire in hospital. No previous syncopal episodes until today. He denies any focal weakness, numbness or tingling. No bowel or bladder incontinence. No tongue biting. No fever or vomiting.  The history is provided by the patient and the EMS personnel.    Past Medical History  Diagnosis Date  . OSA (obstructive sleep apnea)   . Essential hypertension, benign   . Hypercholesteremia   . Multinodular goiter   . Colon polyps   . Pulmonary nodules   . Alzheimer's dementia     a. early.  . CHB (complete heart block)     resolved off metoprolol  . Low back pain   . Other and unspecified hyperlipidemia   . Depressive disorder, not elsewhere classified   . Anxiety state, unspecified   . Esophageal reflux   . Pain in joint, shoulder region     Currently resolved off statin therapy  . Cold intolerance   . Myalgia   . Vitamin D deficiency   . Memory loss   . Lesion of lung     Stable and felt inflammatory  . Obesity (BMI 30-39.9)   . CAD (coronary artery disease)     CAD with prior PCI. a. 04/2010 CABG x 5: LIMA->LAD, VG->RI, VG->LCX, VG->RPDA->RPL  . Coronary atherosclerosis of native coronary artery     Past Surgical History  Procedure Laterality Date  . Achilles tendon repair    . Coronary artery bypass grafting x5 with left  05/17/2010    CABG x 5 with LIMA to LAD, SVG to RI, SVG to Cfx and seq. SVG to PDA and PLOM  . Coronary angioplasty with stent placement  02/1998   Family History  Problem Relation Age of Onset  . Heart disease Father     no premature CAD.  [name] CAD Father   . Congestive Heart Failure Mother   . Diabetes Mother     DM  . Cancer - Other Brother   . Diabetes Brother     DM  . Hypertension Brother   . CAD Brother   . Heart murmur Sister   . Diabetes Maternal Grandmother     DM  . Heart attack Paternal Grandfather   . CAD Paternal Grandfather    History  Substance Use Topics  . Smoking status: Former Smoker -- 15.00 packs/day    Types: Cigarettes    Quit date: 01/24/1979  . Smokeless tobacco: Not on file  . Alcohol Use: Yes     Comment: 7-10 drinks per week    Review of Systems  Constitutional: Positive for diaphoresis. Negative for fever, activity change, appetite change and fatigue.  HENT: Negative for congestion and rhinorrhea.   Respiratory: Negative  for cough and chest tightness.   Cardiovascular: Positive for syncope. Negative for chest pain.  Gastrointestinal: Negative for nausea, vomiting and abdominal pain.  Genitourinary: Negative for dysuria and urgency.  Musculoskeletal: Negative for arthralgias and myalgias.  Neurological: Positive for dizziness and syncope. Negative for weakness, light-headedness and headaches.  A complete 10 system review of systems was obtained and all systems are negative except as noted in the HPI and PMH.      Allergies  Metoprolol  Home Medications   Prior to Admission medications   Medication Sig Start Date End Date Taking? Authorizing Provider  acetaminophen (TYLENOL) 650 MG CR tablet Take 650 mg by mouth 2 (two) times daily.    Yes Historical Provider, MD  amLODipine (NORVASC) 5 MG tablet Take 5 mg  by mouth every morning.    Yes Historical Provider, MD  aspirin 81 MG tablet Take 2 tablets (162 mg total) by mouth every morning. 05/14/13  Yes Brooke O Edmisten, PA-C  benazepril (LOTENSIN) 10 MG tablet Take 10 mg by mouth every morning.   Yes Historical Provider, MD  co-enzyme Q-10 30 MG capsule Take 30 mg by mouth daily.    Yes Historical Provider, MD  donepezil (ARICEPT) 10 MG tablet Take 10 mg by mouth every morning.  10/08/10  Yes Historical Provider, MD  furosemide (LASIX) 40 MG tablet Take 1 tablet (40 mg total) by mouth daily. 06/05/13  Yes Marinus Maw, MD  omeprazole (PRILOSEC) 20 MG capsule Take 20 mg by mouth every evening.    Yes Historical Provider, MD  rosuvastatin (CRESTOR) 40 MG tablet Take 40 mg by mouth every morning.    Yes Historical Provider, MD  sertraline (ZOLOFT) 100 MG tablet Take 50-100 mg by mouth 2 (two) times daily. Patient takes 50 mg in the morning and 100 mg at night   Yes Historical Provider, MD  nitroGLYCERIN (NITROSTAT) 0.4 MG SL tablet Place 1 tablet (0.4 mg total) under the tongue every 5 (five) minutes x 3 doses as needed for chest pain. 03/19/13   Eda Paschal Kilroy, PA-C   BP 139/79  Pulse 63  Temp(Src) 98.6 F (37 C) (Oral)  Resp 18  Ht 5\' 11"  (1.803 m)  Wt 229 lb (103.874 kg)  BMI 31.95 kg/m2  SpO2 98% Physical Exam  Nursing note and vitals reviewed. Constitutional: He is oriented to person, place, and time. He appears well-developed and well-nourished. No distress.  HENT:  Head: Normocephalic and atraumatic.  Mouth/Throat: Oropharynx is clear and moist. No oropharyngeal exudate.  Eyes: Conjunctivae and EOM are normal. Pupils are equal, round, and reactive to light.  Neck: Normal range of motion. Neck supple.  No meningismus  Cardiovascular: Normal rate, regular rhythm, normal heart sounds and intact distal pulses.   No murmur heard. +2 femoral, DP, PT pulses  Pulmonary/Chest: Effort normal and breath sounds normal. No respiratory distress. He has  no wheezes.  Abdominal: Soft. There is no tenderness. There is no rebound and no guarding.  Musculoskeletal: Normal range of motion. He exhibits no edema and no tenderness.  No calf swelling, tenderness, or palpable cords  Neurological: He is alert and oriented to person, place, and time. No cranial nerve deficit.  5/5 strength throughout, CN 2-12 intact, no ataxia on finger to nose, no nystagmus  Skin: Skin is warm and dry. No rash noted.  Psychiatric: He has a normal mood and affect.    ED Course  Procedures (including critical care time) Labs Review Labs Reviewed  COMPREHENSIVE  METABOLIC PANEL - Abnormal; Notable for the following:    Glucose, Bld 111 (*)    GFR calc non Af Amer 88 (*)    All other components within normal limits  CBC WITH DIFFERENTIAL  TROPONIN I  PROTIME-INR    Imaging Review Dg Chest 2 View  06/23/2013   CLINICAL DATA:  Syncope and headache.  EXAM: CHEST  2 VIEW  COMPARISON:  Chest x-ray 05/11/2013.  FINDINGS: Linear opacity at the base of the right lung most compatible with subsegmental atelectasis in the right middle lobe. No acute consolidative airspace disease. No pleural effusions. No evidence of pulmonary edema. Heart size is normal. Mediastinal contours are unremarkable. Atherosclerosis in the thoracic aorta. Status post median sternotomy for CABG.  IMPRESSION: 1. Low lung volumes with right middle lobe subsegmental atelectasis. 2. Atherosclerosis.   Electronically Signed   By: Trudie Reedaniel  Entrikin M.D.   On: 06/23/2013 18:51   Ct Head Wo Contrast  06/23/2013   CLINICAL DATA:  Loss of consciousness. No recollection of the event. History of prior syncopal episodes. History of hypertension.  EXAM: CT HEAD WITHOUT CONTRAST  TECHNIQUE: Contiguous axial images were obtained from the base of the skull through the vertex without contrast.  COMPARISON:  CT head most recent 05/11/2013.  MR brain 08/19/2011.  FINDINGS: No evidence for acute infarction, hemorrhage, mass  lesion, hydrocephalus, or extra-axial fluid. Mild atrophy. Mild chronic microvascular ischemic change. Bilateral remote basal ganglia lacunar infarcts. Vascular calcification. Clear sinuses and mastoids. Intact calvarium. Similar appearance to priors.  IMPRESSION: Chronic changes as described.  No acute intracranial abnormality.   Electronically Signed   By: Davonna BellingJohn  Curnes M.D.   On: 06/23/2013 18:22     EKG Interpretation   Date/Time:  Monday June 23 2013 17:12:05 EDT Ventricular Rate:  63 PR Interval:  166 QRS Duration: 99 QT Interval:  433 QTC Calculation: 443 R Axis:   24 Text Interpretation:  Sinus rhythm Low voltage, precordial leads No  significant change was found Confirmed by Manus GunningANCOUR  MD, Nicolaus Andel ([id number]) on  06/23/2013 5:27:28 PM      MDM   Final diagnoses:  Syncope   Syncopal episode with minimal prodrome. History of complete heart block secondary to AV nodal blocking agents 2 months ago. Mental status at baseline per family. Normal sinus rhythm an EKG currently  CT head negative. Chest x-ray unchanged. EKG normal sinus rhythm.  labs appear to be at baseline. Patient with normal mental status per family.  Given patient's recent admission, there is concern for recurrent cardiogenic syncope. He is no longer on AV nodal blocking agents. Discussed with cardiology who will admit for observation and consider pacemaker placement if any arrhythmias recur.  Glynn OctaveStephen Lissandra Keil, MD 06/23/13 [phone]

## 2013-06-23 NOTE — ED Notes (Addendum)
Pt reports to the ED via [facility] for eval of syncopal episode. Pt was sitting in a chair talking to someone and he had a full syncopal episode and was unresponsive. Pt was sitting at the time and therefore did not fall and injure himself in any way. Pt was pale and diaphoretic upon EMS arrival. Pt was admitted and had a temporary pacemaker placed because he was having episodes of asystole. He was taken off of his metoprolol and the episodes stopped so a permenant pacemaker was not placed.12 lead was NSR and VSS en route and CBG was 78 mg/dl. Pt A&Ox4, resp e/u, and skin warm and dry.

## 2013-06-23 NOTE — H&P (Signed)
Chief Complaint: Syncope Primary Cardiologist: Dr. Tamala Julian EP: Dr. Lovena Le  HPI: [name] is a 68 year old man with known CAD s/p CABG in 2012 (low risk myoview in February 2015), HTN, dyslipidemia, OSA and h/o recent syncope. He was admitted to [facility] from 05/11/13-05/14/13 for evaluation for syncope. He was admitted for close observation. While admitted he experienced recurrent syncope with documented complete heart block with asystole due to multiple consecutive nonconducted P waves on telemetry, requiring temporary pacing wire placement. Potassium was 4.1 and Magnesium 2.3. Troponin was negative. TSH was normal. Head CT was negative for intracranial abnormality. ECG on admission showed sinus bradycardia with normal intervals and no ST-T wave abnormalities. [name] denied any recent trouble with CP or SOB. He denied palpitations and had no prior history of cardiac arrhythmias. He denied LE swelling, orthopnea or PND. He was taking metoprolol succinate prior to admission which was discontinued. He was evaluated by EP and was seen by Dr. Lovena Le. He remained off all AV nodal blocking / rate slowing medications and observed for 48 hours without recurrent CHB. Temporary pacing wire was removed and watchful waiting was recommended. He was seen in clinic by Dr. Lovena Le for post hospital f/u on 06/05/13. At that time he had no recurrent syncope and Dr. Lovena Le felt there was nor indication for PPM.   He presents back to the ER today after sustaining a witness syncopal episode today at his independent living facility (has dementia). He was reported to be unresponsive for several seconds. He reports feeling hot and diaphoretic prior to the event. He denies any other symptoms.    In ER, his EKG demonstrates NSR, with HR in the 60s. No pauses noted on telemetry. Troponin negative x 1. CXR c/w atelectasis. Ct of head unremarkable.     Past Medical History  Diagnosis Date  . OSA (obstructive sleep apnea)   .  Essential hypertension, benign   . Hypercholesteremia   . Multinodular goiter   . Colon polyps   . Pulmonary nodules   . Alzheimer's dementia     a. early.  . CHB (complete heart block)     resolved off metoprolol  . Low back pain   . Other and unspecified hyperlipidemia   . Depressive disorder, not elsewhere classified   . Anxiety state, unspecified   . Esophageal reflux   . Pain in joint, shoulder region     Currently resolved off statin therapy  . Cold intolerance   . Myalgia   . Vitamin D deficiency   . Memory loss   . Lesion of lung     Stable and felt inflammatory  . Obesity (BMI 30-39.9)   . CAD (coronary artery disease)     CAD with prior PCI. a. 04/2010 CABG x 5: LIMA->LAD, VG->RI, VG->LCX, VG->RPDA->RPL  . Coronary atherosclerosis of native coronary artery     Past Surgical History  Procedure Laterality Date  . Achilles tendon repair    . Coronary artery bypass grafting x5 with left  05/17/2010    CABG x 5 with LIMA to LAD, SVG to RI, SVG to Cfx and seq. SVG to PDA and PLOM  . Coronary angioplasty with stent placement  02/1998    Family History  Problem Relation Age of Onset  . Heart disease Father     no premature CAD.  [name] CAD Father   . Congestive Heart Failure Mother   . Diabetes Mother     DM  . Cancer -  Other Brother   . Diabetes Brother     DM  . Hypertension Brother   . CAD Brother   . Heart murmur Sister   . Diabetes Maternal Grandmother     DM  . Heart attack Paternal Grandfather   . CAD Paternal Grandfather    Social History:  reports that he quit smoking about 34 years ago. His smoking use included Cigarettes. He smoked 15.00 packs per day. He does not have any smokeless tobacco history on file. He reports that he drinks alcohol. He reports that he does not use illicit drugs.  Allergies:  Allergies  Allergen Reactions  . Metoprolol Other (See Comments)    bradycardia / complete heart block     (Not in a hospital admission)  Results  for orders placed during the hospital encounter of 06/23/13 (from the past 48 hour(s))  CBC WITH DIFFERENTIAL     Status: None   Collection Time    06/23/13  5:43 PM      Result Value Ref Range   WBC 9.3  4.0 - 10.5 K/uL   RBC 4.60  4.22 - 5.81 MIL/uL   Hemoglobin 14.3  13.0 - 17.0 g/dL   HCT 40.6  39.0 - 52.0 %   MCV 88.3  78.0 - 100.0 fL   MCH 31.1  26.0 - 34.0 pg   MCHC 35.2  30.0 - 36.0 g/dL   RDW 13.7  11.5 - 15.5 %   Platelets 212  150 - 400 K/uL   Neutrophils Relative % 73  43 - 77 %   Neutro Abs 6.7  1.7 - 7.7 K/uL   Lymphocytes Relative 17  12 - 46 %   Lymphs Abs 1.6  0.7 - 4.0 K/uL   Monocytes Relative 8  3 - 12 %   Monocytes Absolute 0.7  0.1 - 1.0 K/uL   Eosinophils Relative 2  0 - 5 %   Eosinophils Absolute 0.2  0.0 - 0.7 K/uL   Basophils Relative 0  0 - 1 %   Basophils Absolute 0.0  0.0 - 0.1 K/uL  COMPREHENSIVE METABOLIC PANEL     Status: Abnormal   Collection Time    06/23/13  5:43 PM      Result Value Ref Range   Sodium 141  137 - 147 mEq/L   Potassium 4.1  3.7 - 5.3 mEq/L   Chloride 105  96 - 112 mEq/L   CO2 23  19 - 32 mEq/L   Glucose, Bld 111 (*) 70 - 99 mg/dL   BUN 22  6 - 23 mg/dL   Creatinine, Ser 0.86  0.50 - 1.35 mg/dL   Calcium 9.4  8.4 - 10.5 mg/dL   Total Protein 7.2  6.0 - 8.3 g/dL   Albumin 4.0  3.5 - 5.2 g/dL   AST 25  0 - 37 U/L   ALT 21  0 - 53 U/L   Alkaline Phosphatase 79  39 - 117 U/L   Total Bilirubin 0.3  0.3 - 1.2 mg/dL   GFR calc non Af Amer 88 (*) >90 mL/min   GFR calc Af Amer >90  >90 mL/min   Comment: (NOTE)     The eGFR has been calculated using the CKD EPI equation.     This calculation has not been validated in all clinical situations.     eGFR's persistently <90 mL/min signify possible Chronic Kidney     Disease.  TROPONIN I     Status: None  Collection Time    06/23/13  5:43 PM      Result Value Ref Range   Troponin I <0.30  <0.30 ng/mL   Comment:            Due to the release kinetics of cTnI,     a negative  result within the first hours     of the onset of symptoms does not rule out     myocardial infarction with certainty.     If myocardial infarction is still suspected,     repeat the test at appropriate intervals.  PROTIME-INR     Status: None   Collection Time    06/23/13  5:43 PM      Result Value Ref Range   Prothrombin Time 13.2  11.6 - 15.2 seconds   INR 1.02  0.00 - 1.49   Dg Chest 2 View  06/23/2013   CLINICAL DATA:  Syncope and headache.  EXAM: CHEST  2 VIEW  COMPARISON:  Chest x-ray 05/11/2013.  FINDINGS: Linear opacity at the base of the right lung most compatible with subsegmental atelectasis in the right middle lobe. No acute consolidative airspace disease. No pleural effusions. No evidence of pulmonary edema. Heart size is normal. Mediastinal contours are unremarkable. Atherosclerosis in the thoracic aorta. Status post median sternotomy for CABG.  IMPRESSION: 1. Low lung volumes with right middle lobe subsegmental atelectasis. 2. Atherosclerosis.   Electronically Signed   By: Vinnie Langton M.D.   On: 06/23/2013 18:51   Ct Head Wo Contrast  06/23/2013   CLINICAL DATA:  Loss of consciousness. No recollection of the event. History of prior syncopal episodes. History of hypertension.  EXAM: CT HEAD WITHOUT CONTRAST  TECHNIQUE: Contiguous axial images were obtained from the base of the skull through the vertex without contrast.  COMPARISON:  CT head most recent 05/11/2013.  MR brain 08/19/2011.  FINDINGS: No evidence for acute infarction, hemorrhage, mass lesion, hydrocephalus, or extra-axial fluid. Mild atrophy. Mild chronic microvascular ischemic change. Bilateral remote basal ganglia lacunar infarcts. Vascular calcification. Clear sinuses and mastoids. Intact calvarium. Similar appearance to priors.  IMPRESSION: Chronic changes as described.  No acute intracranial abnormality.   Electronically Signed   By: Rolla Flatten M.D.   On: 06/23/2013 18:22    Review of Systems  Neurological:  Positive for loss of consciousness.  All other systems reviewed and are negative.   Blood pressure 127/73, pulse 60, temperature 98.5 F (36.9 C), temperature source Oral, resp. rate 18, SpO2 97.00%. Physical Exam  Constitutional: He is oriented to person, place, and time. He appears well-developed and well-nourished. No distress.  Neck: No JVD present. Carotid bruit is not present.  Cardiovascular: Normal rate, regular rhythm, normal heart sounds and intact distal pulses.  Exam reveals no gallop and no friction rub.   No murmur heard. Pulses:      Radial pulses are 2+ on the right side, and 2+ on the left side.       Dorsalis pedis pulses are 2+ on the right side, and 2+ on the left side.  Respiratory: Effort normal and breath sounds normal. No respiratory distress. He has no wheezes. He has no rales.  GI: Soft. Bowel sounds are normal. He exhibits no distension and no mass. There is no tenderness.  Musculoskeletal: He exhibits no edema.  Neurological: He is alert and oriented to person, place, and time.  Skin: Skin is warm and dry. He is not diaphoretic.  Psychiatric: He has a normal mood  and affect. His behavior is normal.     Assessment/Plan Active Problems:   CAD - CABG x 28 April 2010   Syncope  1. Syncope: h/o of recent syncope in the setting of documented CHB during prior admission. BB was discontinued and conduction abnormality improved. He had recurrent syncope today, while off BB. CT of head negative.  Telemetry in ER is w/o pauses. Will admit for observation. Will continue to monitor overnight on telemetry and will make NPO at midnight. ? PPM insertion in the am.    2. CAD: S/p CABG x 5 in 2012. No current CP. Troponin negative x 1. Continue to cycle. EKG w/o ischemic changes. Myoview in February of this year was low risk for ischemia.   Brittainy Simmons 06/23/2013, 7:42 PM

## 2013-06-24 ENCOUNTER — Encounter (HOSPITAL_COMMUNITY)
Admission: EM | Disposition: A | Discharge status: Home / Self Care | Payer: Self-pay | Source: Home / Self Care | Attending: Cardiovascular Disease

## 2013-06-24 ENCOUNTER — Encounter (HOSPITAL_COMMUNITY): Payer: Self-pay | Admitting: *Deleted

## 2013-06-24 DIAGNOSIS — I441 Atrioventricular block, second degree: Secondary | ICD-10-CM

## 2013-06-24 HISTORY — PX: PERMANENT PACEMAKER INSERTION: SHX5480

## 2013-06-24 HISTORY — PX: PACEMAKER INSERTION: SHX728

## 2013-06-24 LAB — MAGNESIUM: MAGNESIUM: 2.2 mg/dL (ref 1.5–2.5)

## 2013-06-24 SURGERY — PERMANENT PACEMAKER INSERTION
Anesthesia: LOCAL

## 2013-06-24 MED ORDER — CEFAZOLIN SODIUM 1-5 GM-% IV SOLN
1.0000 g | Freq: Four times a day (QID) | INTRAVENOUS | Status: AC
  Administered 2013-06-24 – 2013-06-25 (×2): 1 g via INTRAVENOUS
  Filled 2013-06-24 (×2): qty 50

## 2013-06-24 MED ORDER — ATROPINE SULFATE 0.1 MG/ML IJ SOLN
INTRAMUSCULAR | Status: AC
  Filled 2013-06-24: qty 10

## 2013-06-24 MED ORDER — DONEPEZIL HCL 10 MG PO TABS
10.0000 mg | ORAL_TABLET | Freq: Every day | ORAL | Status: DC
  Administered 2013-06-24 – 2013-06-25 (×2): 10 mg via ORAL
  Filled 2013-06-24 (×2): qty 1

## 2013-06-24 MED ORDER — ONDANSETRON HCL 4 MG/2ML IJ SOLN: 4.0000 mg | Freq: Four times a day (QID) | INTRAMUSCULAR | Status: DC | PRN

## 2013-06-24 MED ORDER — SERTRALINE HCL 100 MG PO TABS
100.0000 mg | ORAL_TABLET | Freq: Every day | ORAL | Status: DC
  Administered 2013-06-24: 100 mg via ORAL
  Filled 2013-06-24 (×2): qty 1

## 2013-06-24 MED ORDER — CHLORHEXIDINE GLUCONATE 4 % EX LIQD: 60.0000 mL | Freq: Once | CUTANEOUS | Status: AC

## 2013-06-24 MED ORDER — CEFAZOLIN SODIUM-DEXTROSE 2-3 GM-% IV SOLR
2.0000 g | INTRAVENOUS | Status: DC
  Filled 2013-06-24: qty 50

## 2013-06-24 MED ORDER — SERTRALINE HCL 50 MG PO TABS
50.0000 mg | ORAL_TABLET | Freq: Every day | ORAL | Status: DC
  Administered 2013-06-24 – 2013-06-25 (×2): 50 mg via ORAL
  Filled 2013-06-24 (×2): qty 1

## 2013-06-24 MED ORDER — ACETAMINOPHEN 325 MG PO TABS
650.0000 mg | ORAL_TABLET | Freq: Two times a day (BID) | ORAL | Status: DC
  Administered 2013-06-24: 650 mg via ORAL
  Filled 2013-06-24: qty 2

## 2013-06-24 MED ORDER — SODIUM CHLORIDE 0.9 % IV SOLN: INTRAVENOUS | Status: DC

## 2013-06-24 MED ORDER — AMLODIPINE BESYLATE 5 MG PO TABS
5.0000 mg | ORAL_TABLET | Freq: Every day | ORAL | Status: DC
  Administered 2013-06-24 – 2013-06-25 (×2): 5 mg via ORAL
  Filled 2013-06-24 (×2): qty 1

## 2013-06-24 MED ORDER — ACETAMINOPHEN 325 MG PO TABS
325.0000 mg | ORAL_TABLET | ORAL | Status: DC | PRN
Start: 2013-06-24 — End: 2013-06-25
  Filled 2013-06-24: qty 2

## 2013-06-24 MED ORDER — ASPIRIN EC 81 MG PO TBEC
162.0000 mg | DELAYED_RELEASE_TABLET | Freq: Every day | ORAL | Status: DC
  Administered 2013-06-24 – 2013-06-25 (×2): 162 mg via ORAL
  Filled 2013-06-24 (×2): qty 2

## 2013-06-24 MED ORDER — SODIUM CHLORIDE 0.9 % IJ SOLN: 3.0000 mL | INTRAMUSCULAR | Status: DC | PRN

## 2013-06-24 MED ORDER — NITROGLYCERIN 0.4 MG SL SUBL: 0.4000 mg | SUBLINGUAL_TABLET | SUBLINGUAL | Status: DC | PRN

## 2013-06-24 MED ORDER — ACETAMINOPHEN ER 650 MG PO TBCR: 650.0000 mg | EXTENDED_RELEASE_TABLET | Freq: Two times a day (BID) | ORAL | Status: DC

## 2013-06-24 MED ORDER — SODIUM CHLORIDE 0.9 % IJ SOLN: 3.0000 mL | Freq: Two times a day (BID) | INTRAMUSCULAR | Status: DC

## 2013-06-24 MED ORDER — CEFAZOLIN SODIUM 1-5 GM-% IV SOLN
1.0000 g | Freq: Four times a day (QID) | INTRAVENOUS | Status: DC
  Filled 2013-06-24 (×3): qty 50

## 2013-06-24 MED ORDER — BENAZEPRIL HCL 10 MG PO TABS
10.0000 mg | ORAL_TABLET | Freq: Every day | ORAL | Status: DC
  Administered 2013-06-24 – 2013-06-25 (×2): 10 mg via ORAL
  Filled 2013-06-24 (×2): qty 1

## 2013-06-24 MED ORDER — SODIUM CHLORIDE 0.9 % IR SOLN
80.0000 mg | Status: DC
  Filled 2013-06-24: qty 2

## 2013-06-24 MED ORDER — HYDROCODONE-ACETAMINOPHEN 5-325 MG PO TABS
1.0000 | ORAL_TABLET | ORAL | Status: DC | PRN
  Administered 2013-06-24: 2 via ORAL
  Filled 2013-06-24: qty 2

## 2013-06-24 MED ORDER — CHLORHEXIDINE GLUCONATE 4 % EX LIQD
60.0000 mL | Freq: Once | CUTANEOUS | Status: DC
  Filled 2013-06-24: qty 45

## 2013-06-24 MED ORDER — PANTOPRAZOLE SODIUM 40 MG PO TBEC
40.0000 mg | DELAYED_RELEASE_TABLET | Freq: Every day | ORAL | Status: DC
Start: 2013-06-24 — End: 2013-06-25
  Administered 2013-06-24 – 2013-06-25 (×2): 40 mg via ORAL
  Filled 2013-06-24 (×2): qty 1

## 2013-06-24 MED ORDER — ATORVASTATIN CALCIUM 80 MG PO TABS
80.0000 mg | ORAL_TABLET | Freq: Every day | ORAL | Status: DC
  Administered 2013-06-24: 80 mg via ORAL
  Filled 2013-06-24 (×2): qty 1

## 2013-06-24 MED ORDER — SODIUM CHLORIDE 0.9 % IV SOLN: 250.0000 mL | INTRAVENOUS | Status: DC | PRN

## 2013-06-24 NOTE — Consult Note (Signed)
ELECTROPHYSIOLOGY CONSULT NOTE    Patient ID: [name] MRN: [id number], DOB/AGE: 1945-05-29 68 y.o.  Admit date: 06/23/2013 Date of Consult: 06-24-13  Primary Physician: [name], ROBERT L, MD Primary Cardiologist: [name]  Reason for Consultation: heart block and syncope  HPI:  Connor PernaJohn J Trudo is a 68 y.o. male with known CAD s/p CABG 2012, HTN, dyslipidemia, OSA and Alzheimers dementia who presented in April of 2015 with syncope.  At that time, he reported intermittent dizziness x 1 week. He was found to have 2:1 heart block while he was on Metoprolol. This was held and his conduction improved and he was discharged to home.  He followed up with [name] in the office recently and had had no recurrences of syncope or pre-syncope.  On the day of admission, [facility] where he [facility] and had a syncopal spell that was preceded by diaphoresis and flushing.  EMS was called and he was brought to [facility].  On telemetry this admission, he has had both daytime and nocturnal episodes of Mobitz II heart block associated with symptoms of nausea while awake.    He denies recent chest pain, shortness of breath, fevers, chills, nausea, vomiting.  He states that his exercise tolerance is stable.   Echo 04/2013 demonstrated EF 55-60%, grade 1 diastolic dysfunction, no significant valvular abnormalities.   EP has been asked to evaluate for treatment options.  ROS is negative except as outlined above.   Past Medical History  Diagnosis Date  . OSA (obstructive sleep apnea)   . Essential hypertension, benign   . Hypercholesteremia   . Multinodular goiter   . Colon polyps   . Pulmonary nodules   . Alzheimer's dementia     a. early.  . CHB (complete heart block)     resolved off metoprolol  . Low back pain   . Other and unspecified hyperlipidemia   . Depressive disorder, not elsewhere classified   . Anxiety state, unspecified   . Esophageal reflux   . Pain in  joint, shoulder region     Currently resolved off statin therapy  . Cold intolerance   . Myalgia   . Vitamin D deficiency   . Memory loss   . Lesion of lung     Stable and felt inflammatory  . Obesity (BMI 30-39.9)   . CAD (coronary artery disease)     CAD with prior PCI. a. 04/2010 CABG x 5: LIMA->LAD, VG->RI, VG->LCX, VG->RPDA->RPL  . Coronary atherosclerosis of native coronary artery      Surgical History:  Past Surgical History  Procedure Laterality Date  . Achilles tendon repair    . Coronary artery bypass grafting x5 with left  05/17/2010    CABG x 5 with LIMA to LAD, SVG to RI, SVG to Cfx and seq. SVG to PDA and PLOM  . Coronary angioplasty with stent placement  02/1998     Prescriptions prior to admission  Medication Sig Dispense Refill  . acetaminophen (TYLENOL) 650 MG CR tablet Take 650 mg by mouth 2 (two) times daily.       [name]. amLODipine (NORVASC) 5 MG tablet Take 5 mg by mouth every morning.       [name]. aspirin 81 MG tablet Take 2 tablets (162 mg total) by mouth every morning.  30 tablet    . benazepril (LOTENSIN) 10 MG tablet Take 10 mg by mouth every morning.      [name]. co-enzyme Q-10 30 MG  capsule Take 30 mg by mouth daily.       [name] donepezil (ARICEPT) 10 MG tablet Take 10 mg by mouth every morning.       . furosemide (LASIX) 40 MG tablet Take 1 tablet (40 mg total) by mouth daily.  90 tablet  3  . omeprazole (PRILOSEC) 20 MG capsule Take 20 mg by mouth every evening.       . rosuvastatin (CRESTOR) 40 MG tablet Take 40 mg by mouth every morning.       . sertraline (ZOLOFT) 100 MG tablet Take 50-100 mg by mouth 2 (two) times daily. Patient takes 50 mg in the morning and 100 mg at night      . nitroGLYCERIN (NITROSTAT) 0.4 MG SL tablet Place 1 tablet (0.4 mg total) under the tongue every 5 (five) minutes x 3 doses as needed for chest pain.  25 tablet  2    Inpatient Medications:  . acetaminophen  650 mg Oral BID  . amLODipine  5 mg Oral Daily  . aspirin EC  162 mg Oral Daily   . atorvastatin  80 mg Oral q1800  . benazepril  10 mg Oral Daily  . donepezil  10 mg Oral Daily  . pantoprazole  40 mg Oral Daily  . sertraline  100 mg Oral QHS  . sertraline  50 mg Oral Daily    Allergies:  Allergies  Allergen Reactions  . Metoprolol Other (See Comments)    bradycardia / complete heart block    History   Social History  . Marital Status: Married    Spouse Name: N/A    Number of Children: N/A  . Years of Education: N/A   Occupational History  . Not on file.   Social History Main Topics  . Smoking status: Former Smoker -- 15.00 packs/day    Types: Cigarettes    Quit date: 01/24/1979  . Smokeless tobacco: Not on file  . Alcohol Use: Yes     Comment: 7-10 drinks per week  . Drug Use: No  . Sexual Activity: Not on file   Other Topics Concern  . Not on file   Social History Narrative   Pt lives in [place].     Family History  Problem Relation Age of Onset  . Heart disease Father     no premature CAD.  [name] CAD Father   . Congestive Heart Failure Mother   . Diabetes Mother     DM  . Cancer - Other Brother   . Diabetes Brother     DM  . Hypertension Brother   . CAD Brother   . Heart murmur Sister   . Diabetes Maternal Grandmother     DM  . Heart attack Paternal Grandfather   . CAD Paternal Grandfather     Physical Exam: Filed Vitals:   06/23/13 2015 06/23/13 2030 06/23/13 2136 06/24/13 0538  BP: 137/83 145/86 139/79 128/77  Pulse: 71 64 63 58  Temp:   98.6 F (37 C) 97.7 F (36.5 C)  TempSrc:   Oral Oral  Resp: 19 16 18 18   Height:   5\' 11"  (1.803 m)   Weight:   229 lb (103.874 kg) 228 lb 13.4 oz (103.8 kg)  SpO2: 99% 97% 98% 97%    GEN- The patient is well appearing, alert but confused Head- normocephalic, atraumatic Eyes-  Sclera clear, conjunctiva pink Ears- hearing intact Oropharynx- clear Neck- supple  Lungs- Clear to ausculation bilaterally, normal work of breathing  Heart- Regular rate and rhythm, no murmurs,  rubs or gallops, PMI not laterally displaced GI- soft, NT, ND, + BS Extremities- no clubbing, cyanosis, or edema MS- no significant deformity or atrophy Skin- no rash or lesion Psych- euthymic mood, full affect, has difficulty with short term memory recall Neuro- strength and sensation are intact  Labs:   Lab Results  Component Value Date   WBC 9.3 06/23/2013   HGB 14.3 06/23/2013   HCT 40.6 06/23/2013   MCV 88.3 06/23/2013   PLT 212 06/23/2013    Recent Labs Lab 06/23/13 1743  NA 141  K 4.1  CL 105  CO2 23  BUN 22  CREATININE 0.86  CALCIUM 9.4  PROT 7.2  BILITOT 0.3  ALKPHOS 79  ALT 21  AST 25  GLUCOSE 111*    Radiology/Studies: Dg Chest 2 View 06/23/2013   CLINICAL DATA:  Syncope and headache.  EXAM: CHEST  2 VIEW  COMPARISON:  Chest x-ray 05/11/2013.  FINDINGS: Linear opacity at the base of the right lung most compatible with subsegmental atelectasis in the right middle lobe. No acute consolidative airspace disease. No pleural effusions. No evidence of pulmonary edema. Heart size is normal. Mediastinal contours are unremarkable. Atherosclerosis in the thoracic aorta. Status post median sternotomy for CABG.  IMPRESSION: 1. Low lung volumes with right middle lobe subsegmental atelectasis. 2. Atherosclerosis.   Electronically Signed   By: Trudie Reed M.D.   On: 06/23/2013 18:51   ZCH:YIFOY rhythm, rate 63, normal intervals  TELEMETRY: sinus rhythm, intermittent mobitz II heart block  Assessment and Plan:  1. 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  He had 19 seconds of transient AV block with syncope this afternoon while awake.  No reversible causes are present.  I would therefore recommend pacemaker implantation at this time.  Risks, benefits, alternatives to pacemaker implantation were discussed in detail with the patient and his wife today. The patients wife and children understand that the risks include but are not  limited to bleeding, infection, pneumothorax, perforation, tamponade, vascular damage, renal failure, MI, stroke, death,  and lead dislodgement and wishes to proceed. We will therefore schedule the procedure at the next available time.

## 2013-06-24 NOTE — Progress Notes (Signed)
Notified by monitor tech pts HR dropped to 32 non sustained.  Appeared pt had a short episode of 2nd degree heart block then HR back up to 58. Pt asymptomatic.  Please see strips saved on monitors. Will continue to monitor. Dierdre Highman, RN

## 2013-06-24 NOTE — H&P (View-Only) (Signed)
ELECTROPHYSIOLOGY CONSULT NOTE    Patient ID: Connor PernaJohn J Poole MRN: [id number], DOB/AGE: 1945-05-29 68 y.o.  Admit date: 06/23/2013 Date of Consult: 06-24-13  Primary Physician: [name], ROBERT L, MD Primary Cardiologist: [name]  Reason for Consultation: heart block and syncope  HPI:  [name] is a 68 y.o. male with known CAD s/p CABG 2012, HTN, dyslipidemia, OSA and Alzheimers dementia who presented in April of 2015 with syncope.  At that time, he reported intermittent dizziness x 1 week. He was found to have 2:1 heart block while he was on Metoprolol. This was held and his conduction improved and he was discharged to home.  He followed up with [name] in the office recently and had had no recurrences of syncope or pre-syncope.  On the day of admission, he was at [facility], where he resides and had a syncopal spell that was preceded by diaphoresis and flushing.  EMS was called and he was brought to [facility] for [facility] evaluation.  On telemetry this admission, he has had both daytime and nocturnal episodes of Mobitz II heart block associated with symptoms of nausea while awake.    He denies recent chest pain, shortness of breath, fevers, chills, nausea, vomiting.  He states that his exercise tolerance is stable.   Echo 04/2013 demonstrated EF 55-60%, grade 1 diastolic dysfunction, no significant valvular abnormalities.   EP has been asked to evaluate for treatment options.  ROS is negative except as outlined above.   Past Medical History  Diagnosis Date  . OSA (obstructive sleep apnea)   . Essential hypertension, benign   . Hypercholesteremia   . Multinodular goiter   . Colon polyps   . Pulmonary nodules   . Alzheimer's dementia     a. early.  . CHB (complete heart block)     resolved off metoprolol  . Low back pain   . Other and unspecified hyperlipidemia   . Depressive disorder, not elsewhere classified   . Anxiety state, unspecified   . Esophageal reflux   . Pain in  joint, shoulder region     Currently resolved off statin therapy  . Cold intolerance   . Myalgia   . Vitamin D deficiency   . Memory loss   . Lesion of lung     Stable and felt inflammatory  . Obesity (BMI 30-39.9)   . CAD (coronary artery disease)     CAD with prior PCI. a. 04/2010 CABG x 5: LIMA->LAD, VG->RI, VG->LCX, VG->RPDA->RPL  . Coronary atherosclerosis of native coronary artery      Surgical History:  Past Surgical History  Procedure Laterality Date  . Achilles tendon repair    . Coronary artery bypass grafting x5 with left  05/17/2010    CABG x 5 with LIMA to LAD, SVG to RI, SVG to Cfx and seq. SVG to PDA and PLOM  . Coronary angioplasty with stent placement  02/1998     Prescriptions prior to admission  Medication Sig Dispense Refill  . acetaminophen (TYLENOL) 650 MG CR tablet Take 650 mg by mouth 2 (two) times daily.       [name]. amLODipine (NORVASC) 5 MG tablet Take 5 mg by mouth every morning.       [name]. aspirin 81 MG tablet Take 2 tablets (162 mg total) by mouth every morning.  30 tablet    . benazepril (LOTENSIN) 10 MG tablet Take 10 mg by mouth every morning.      [name]. co-enzyme Q-10 30 MG  capsule Take 30 mg by mouth daily.       [name] donepezil (ARICEPT) 10 MG tablet Take 10 mg by mouth every morning.       . furosemide (LASIX) 40 MG tablet Take 1 tablet (40 mg total) by mouth daily.  90 tablet  3  . omeprazole (PRILOSEC) 20 MG capsule Take 20 mg by mouth every evening.       . rosuvastatin (CRESTOR) 40 MG tablet Take 40 mg by mouth every morning.       . sertraline (ZOLOFT) 100 MG tablet Take 50-100 mg by mouth 2 (two) times daily. Patient takes 50 mg in the morning and 100 mg at night      . nitroGLYCERIN (NITROSTAT) 0.4 MG SL tablet Place 1 tablet (0.4 mg total) under the tongue every 5 (five) minutes x 3 doses as needed for chest pain.  25 tablet  2    Inpatient Medications:  . acetaminophen  650 mg Oral BID  . amLODipine  5 mg Oral Daily  . aspirin EC  162 mg Oral Daily   . atorvastatin  80 mg Oral q1800  . benazepril  10 mg Oral Daily  . donepezil  10 mg Oral Daily  . pantoprazole  40 mg Oral Daily  . sertraline  100 mg Oral QHS  . sertraline  50 mg Oral Daily    Allergies:  Allergies  Allergen Reactions  . Metoprolol Other (See Comments)    bradycardia / complete heart block    History   Social History  . Marital Status: Married    Spouse Name: N/A    Number of Children: N/A  . Years of Education: N/A   Occupational History  . Not on file.   Social History Main Topics  . Smoking status: Former Smoker -- 15.00 packs/day    Types: Cigarettes    Quit date: 01/24/1979  . Smokeless tobacco: Not on file  . Alcohol Use: Yes     Comment: 7-10 drinks per week  . Drug Use: No  . Sexual Activity: Not on file   Other Topics Concern  . Not on file   Social History Narrative   Pt lives in [place].     Family History  Problem Relation Age of Onset  . Heart disease Father     no premature CAD.  [name] CAD Father   . Congestive Heart Failure Mother   . Diabetes Mother     DM  . Cancer - Other Brother   . Diabetes Brother     DM  . Hypertension Brother   . CAD Brother   . Heart murmur Sister   . Diabetes Maternal Grandmother     DM  . Heart attack Paternal Grandfather   . CAD Paternal Grandfather     Physical Exam: Filed Vitals:   06/23/13 2015 06/23/13 2030 06/23/13 2136 06/24/13 0538  BP: 137/83 145/86 139/79 128/77  Pulse: 71 64 63 58  Temp:   98.6 F (37 C) 97.7 F (36.5 C)  TempSrc:   Oral Oral  Resp: 19 16 18 18   Height:   5\' 11"  (1.803 m)   Weight:   229 lb (103.874 kg) 228 lb 13.4 oz (103.8 kg)  SpO2: 99% 97% 98% 97%    GEN- The patient is well appearing, alert but confused Head- normocephalic, atraumatic Eyes-  Sclera clear, conjunctiva pink Ears- hearing intact Oropharynx- clear Neck- supple  Lungs- Clear to ausculation bilaterally, normal work of breathing  Heart- Regular rate and rhythm, no murmurs,  rubs or gallops, PMI not laterally displaced GI- soft, NT, ND, + BS Extremities- no clubbing, cyanosis, or edema MS- no significant deformity or atrophy Skin- no rash or lesion Psych- euthymic mood, full affect, has difficulty with short term memory recall Neuro- strength and sensation are intact  Labs:   Lab Results  Component Value Date   WBC 9.3 06/23/2013   HGB 14.3 06/23/2013   HCT 40.6 06/23/2013   MCV 88.3 06/23/2013   PLT 212 06/23/2013    Recent Labs Lab 06/23/13 1743  NA 141  K 4.1  CL 105  CO2 23  BUN 22  CREATININE 0.86  CALCIUM 9.4  PROT 7.2  BILITOT 0.3  ALKPHOS 79  ALT 21  AST 25  GLUCOSE 111*    Radiology/Studies: Dg Chest 2 View 06/23/2013   CLINICAL DATA:  Syncope and headache.  EXAM: CHEST  2 VIEW  COMPARISON:  Chest x-ray 05/11/2013.  FINDINGS: Linear opacity at the base of the right lung most compatible with subsegmental atelectasis in the right middle lobe. No acute consolidative airspace disease. No pleural effusions. No evidence of pulmonary edema. Heart size is normal. Mediastinal contours are unremarkable. Atherosclerosis in the thoracic aorta. Status post median sternotomy for CABG.  IMPRESSION: 1. Low lung volumes with right middle lobe subsegmental atelectasis. 2. Atherosclerosis.   Electronically Signed   By: Trudie Reed M.D.   On: 06/23/2013 18:51   ZCH:YIFOY rhythm, rate 63, normal intervals  TELEMETRY: sinus rhythm, intermittent mobitz II heart block  Assessment and Plan:  1. 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  He had 19 seconds of transient AV block with syncope this afternoon while awake.  No reversible causes are present.  I would therefore recommend pacemaker implantation at this time.  Risks, benefits, alternatives to pacemaker implantation were discussed in detail with the patient and his wife today. The patients wife and children understand that the risks include but are not  limited to bleeding, infection, pneumothorax, perforation, tamponade, vascular damage, renal failure, MI, stroke, death,  and lead dislodgement and wishes to proceed. We will therefore schedule the procedure at the next available time.

## 2013-06-24 NOTE — Discharge Summary (Signed)
ELECTROPHYSIOLOGY PROCEDURE DISCHARGE SUMMARY    Patient ID: [name],  MRN: [id number], DOB/AGE: 1945-08-12 68 y.o.  Admit date: 06/23/2013 Discharge date: 06/24/2013  Primary Care Physician: Lenora Boys, MD Primary Cardiologist: [name] Electrophysiologist: Jalise Zawistowski  Primary Discharge Diagnosis:  Mobitz II heart block with syncope  Secondary Discharge Diagnosis:  1.  Early Alzheimer's dementia 2.  CAD s/p CABG 2012 3.  Hypertension 4.  Dyslipidemia 5.  Sleep apnea - not compliant with CPAP  Allergies  Allergen Reactions  . Metoprolol Other (See Comments)    bradycardia / complete heart block     Procedures This Admission:  1.  Implantation of permanent pacemaker by Dr Johney Frame on 06-24-2013.  The patient received a Medtronic model number [id number] pacemaker with model number 5076 right atrial and right ventricular leads. There were no early apparent complications. 2.  CXR on 06-25-2013 demonstrated no ptx, stable leads  Brief HPI/Hospital Course:  [name] is a 68 y.o. male with a past medical history as outlined above. They were admitted in April of this year with syncope and associated heart block.  His metoprolol was held and his conduction improved. He had another syncopal spell on the day of admission and was brought to [facility] for [facility] evaluation.  Telemetry demonstrated Mobitz II heart block with a 19 second period of CHB associated with syncope. Risks, benefits, and alternatives to pacemaker implantation were reviewed with the patient and his family who wished to proceed. The patient underwent implantation of a Medtronic dual chamber pacemaker with details as outlined above.   He was monitored on telemetry overnight which demonstrated sinus rhythm, occasional AV pacing.  Left chest was without hematoma or ecchymosis.  The device was interrogated and found to be functioning normally.  CXR was obtained and demonstrated no pneumothorax status post device implantation.   Wound care, arm mobility, and restrictions were reviewed with the patient.  Dr Johney Frame examined the patient and considered them stable for discharge to home.    Discharge Vitals: Blood pressure 128/77, pulse 58, temperature 97.7 F (36.5 C), temperature source Oral, resp. rate 18, height 5\' 11"  (1.803 m), weight 228 lb 13.4 oz (103.8 kg), SpO2 97.00%.  Physical Exam: Filed Vitals:   06/24/13 1800 06/24/13 1900 06/24/13 2000 06/25/13 0611  BP: 133/75 144/85 139/87 120/76  Pulse: 78 80 79 80  Temp:   98.2 F (36.8 C) 98 F (36.7 C)  TempSrc:      Resp:   18 18  Height:      Weight:    228 lb 9.9 oz (103.7 kg)  SpO2:   98% 95%    GEN- The patient is well appearing, alert     Head- normocephalic, atraumatic Eyes-  Sclera clear, conjunctiva pink Ears- hearing intact Oropharynx- clear Neck- supple  Lungs- Clear to ausculation bilaterally, normal work of breathing Heart- Regular rate and rhythm, no murmurs, rubs or gallops, PMI not laterally displaced GI- soft, NT, ND, + BS Extremities- no clubbing, cyanosis, or edema MS- no significant deformity or atrophy Skin- pacemaker site is without hematoma   Labs:   Lab Results  Component Value Date   WBC 9.3 06/23/2013   HGB 14.3 06/23/2013   HCT 40.6 06/23/2013   MCV 88.3 06/23/2013   PLT 212 06/23/2013     Recent Labs Lab 06/23/13 1743  NA 141  K 4.1  CL 105  CO2 23  BUN 22  CREATININE 0.86  CALCIUM 9.4  PROT 7.2  BILITOT 0.3  ALKPHOS 79  ALT 21  AST 25  GLUCOSE 111*     Discharge Medications:    Medication List    ASK your doctor about these medications       acetaminophen 650 MG CR tablet  Commonly known as:  TYLENOL  Take 650 mg by mouth 2 (two) times daily.     amLODipine 5 MG tablet  Commonly known as:  NORVASC  Take 5 mg by mouth every morning.     aspirin 81 MG tablet  Take 2 tablets (162 mg total) by mouth every morning.     benazepril 10 MG tablet  Commonly known as:  LOTENSIN  Take 10 mg by mouth  every morning.     co-enzyme Q-10 30 MG capsule  Take 30 mg by mouth daily.     donepezil 10 MG tablet  Commonly known as:  ARICEPT  Take 10 mg by mouth every morning.     furosemide 40 MG tablet  Commonly known as:  LASIX  Take 1 tablet (40 mg total) by mouth daily.     nitroGLYCERIN 0.4 MG SL tablet  Commonly known as:  NITROSTAT  Place 1 tablet (0.4 mg total) under the tongue every 5 (five) minutes x 3 doses as needed for chest pain.     omeprazole 20 MG capsule  Commonly known as:  PRILOSEC  Take 20 mg by mouth every evening.     rosuvastatin 40 MG tablet  Commonly known as:  CRESTOR  Take 40 mg by mouth every morning.     sertraline 100 MG tablet  Commonly known as:  ZOLOFT  Take 50-100 mg by mouth 2 (two) times daily. Patient takes 50 mg in the morning and 100 mg at night        Disposition:     Duration of Discharge Encounter: Greater than 30 minutes including physician time.  Signed,  Hillis RangeJames Cynda Soule MD

## 2013-06-24 NOTE — Interval H&P Note (Signed)
History and Physical Interval Note:  06/24/2013 1:47 PM  [name]  has presented today for surgery, with the diagnosis of syncope  The various methods of treatment have been discussed with the patient and family. After consideration of risks, benefits and other options for treatment, the patient has consented to  Procedure(s): PERMANENT PACEMAKER INSERTION (N/A) as a surgical intervention .  The patient's history has been reviewed, patient examined, no change in status, stable for surgery.  I have reviewed the patient's chart and labs.  Questions were answered to the patient's satisfaction.     Hillis Range

## 2013-06-24 NOTE — Progress Notes (Signed)
UR completed 

## 2013-06-24 NOTE — Op Note (Signed)
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 Pacemaker implantation.     INTRODUCTION: [name] is a 68 y.o. male  with a history of symptomatic mobitz II second degree AV block with syncope who presents today for pacemaker implantation.  The patient reports intermittent episodes of syncope over the past few months. Despite stopping Av nodal agents, he continues to have transient complete heart block with prolonged RR intervals (up to 19 seconds) with syncope.  No reversible causes have been identified.  The patient therefore presents today for pacemaker implantation.     DESCRIPTION OF PROCEDURE:  Informed written consent was obtained, and the patient was brought to the electrophysiology lab in a fasting state.  The patient required no sedation for the procedure today.  The patients left chest was prepped and draped in the usual sterile fashion by the EP lab staff. The skin overlying the left deltopectoral region was infiltrated with lidocaine for local analgesia.  A 4-cm incision was made over the left deltopectoral region.  A left subcutaneous pacemaker pocket was fashioned using a combination of sharp and blunt dissection. Electrocautery was required to assure hemostasis.     RA/RV Lead Placement: The left axillary vein was cannulated.  No contrast was required for the procedure today.  Through the left axillary vein, a Medtronic model [id number] (serial number [id number]) right atrial lead and a Medtronic model 5076- [id number] (serial number [id number]) right ventricular lead were advanced   with fluoroscopic visualization into the right atrial appendage and right ventricular apex positions respectively.  Initial atrial lead P- waves measured 2 mV with impedance of 705 ohms and a threshold of 1.5 V at 0.5 msec.  Right  ventricular lead R-waves measured 22 mV with an impedance of 1128 ohms and a threshold of 1.1 V at 0.5 msec.  Both leads were secured to the pectoralis fascia using #2-0 silk over the suture sleeves.   Device Placement:  The leads were then connected to a Medtronic Adapta L model ADDRL 1 (serial number [id number]) pacemaker.  The pocket was irrigated with copious gentamicin solution.  The pacemaker was then placed into the pocket.  The pocket was then closed in 2 layers with 2.0 Vicryl suture for the subcutaneous and subcuticular layers.  Steri- Strips and a sterile dressing were then applied.  There were no early apparent complications.     CONCLUSIONS:   1. Successful implantation of a Medtronic Adapta L dual-chamber pacemaker for symptomatic mobitz II second degree AV block with syncope  2. No early apparent complications.           Hillis Range, MD 06/24/2013 3:33 PM

## 2013-06-25 ENCOUNTER — Encounter (HOSPITAL_COMMUNITY): Payer: Self-pay | Admitting: *Deleted

## 2013-06-25 ENCOUNTER — Inpatient Hospital Stay (HOSPITAL_COMMUNITY): Payer: Medicare Other

## 2013-06-25 DIAGNOSIS — J9819 Other pulmonary collapse: Secondary | ICD-10-CM | POA: Diagnosis not present

## 2013-06-25 DIAGNOSIS — F0280 Dementia in other diseases classified elsewhere without behavioral disturbance: Secondary | ICD-10-CM | POA: Diagnosis not present

## 2013-06-25 DIAGNOSIS — R079 Chest pain, unspecified: Secondary | ICD-10-CM

## 2013-06-25 DIAGNOSIS — I442 Atrioventricular block, complete: Secondary | ICD-10-CM | POA: Diagnosis not present

## 2013-06-25 DIAGNOSIS — I441 Atrioventricular block, second degree: Secondary | ICD-10-CM | POA: Diagnosis not present

## 2013-06-25 DIAGNOSIS — R55 Syncope and collapse: Secondary | ICD-10-CM | POA: Diagnosis not present

## 2013-06-25 NOTE — Discharge Instructions (Signed)
Supplemental Discharge Instructions for  Pacemaker/Defibrillator Patients  Activity No heavy lifting or vigorous activity with your left/right arm for 6 to 8 weeks.  Do not raise your left/right arm above your head for one week.  Gradually raise your affected arm as drawn below.           06/06                      06/07                       06/08                      06/09       NO DRIVING for 6 months until given clearance by Dr. Johney Frame. WOUND CARE   Keep the wound area clean and dry.  Do not get this area wet for one week. No showers for one week; you may shower on 07/02/2013.   The tape/steri-strips on your wound will fall off; do not pull them off.  No bandage is needed on the site.  DO  NOT apply any creams, oils, or ointments to the wound area.   If you notice any drainage or discharge from the wound, any swelling or bruising at the site, or you develop a fever > 101? F after you are discharged home, call the office at once.  Special Instructions   You are still able to use cellular telephones; use the ear opposite the side where you have your pacemaker/defibrillator.  Avoid carrying your cellular phone near your device.   When traveling through airports, show security personnel your identification card to avoid being screened in the metal detectors.  Ask the security personnel to use the hand wand.   Avoid arc welding equipment, MRI testing (magnetic resonance imaging), TENS units (transcutaneous nerve stimulators).  Call the office for questions about other devices.   Avoid electrical appliances that are in poor condition or are not properly grounded.   Microwave ovens are safe to be near or to operate.  Cardiac Diet This diet can help prevent heart disease and stroke. Many factors influence your heart health, including eating and exercise habits. Coronary risk rises a lot with abnormal blood fat (lipid) levels. Cardiac meal planning includes limiting unhealthy fats, increasing  healthy fats, and making other small dietary changes. General guidelines are as follows:  Adjust calorie intake to reach and maintain desirable body weight.  Limit total fat intake to less than 30% of total calories. Saturated fat should be less than 7% of calories.  Saturated fats are found in animal products and in some vegetable products. Saturated vegetable fats are found in coconut oil, cocoa butter, palm oil, and palm kernel oil. Read labels carefully to avoid these products as much as possible. Use butter in moderation. Choose tub margarines and oils that have 2 grams of fat or less. Good cooking oils are canola and olive oils.  Practice low-fat cooking techniques. Do not fry food. Instead, broil, bake, boil, steam, grill, roast on a rack, stir-fry, or microwave it. Other fat reducing suggestions include:  Remove the skin from poultry.  Remove all visible fat from meats.  Skim the fat off stews, soups, and gravies before serving them.  Steam vegetables in water or broth instead of sauting them in fat.  Avoid foods with trans fat (or hydrogenated oils), such as commercially fried foods and commercially baked goods. Oceanographer  shortening and deep-frying fats will contain trans fat.  Increase intake of fruits, vegetables, whole grains, and legumes to replace foods high in fat.  Increase consumption of nuts, legumes, and seeds to at least 4 servings weekly. One serving of a legume equals  cup, and 1 serving of nuts or seeds equals  cup.  Choose whole grains more often. Have 3 servings per day (a serving is 1 ounce [oz]).  Eat 4 to 5 servings of vegetables per day. A serving of vegetables is 1 cup of raw leafy vegetables;  cup of raw or cooked cut-up vegetables;  cup of vegetable juice.  Eat 4 to 5 servings of fruit per day. A serving of fruit is 1 medium whole fruit;  cup of dried fruit;  cup of fresh, frozen, or canned fruit;  cup of 100% fruit juice.  Increase your intake  of dietary fiber to 20 to 30 grams per day. Insoluble fiber may help lower your risk of heart disease and may help curb your appetite.  Soluble fiber binds cholesterol to be removed from the blood. Foods high in soluble fiber are dried beans, citrus fruits, oats, apples, bananas, broccoli, Brussels sprouts, and eggplant.  Try to include foods fortified with plant sterols or stanols, such as yogurt, breads, juices, or margarines. Choose several fortified foods to achieve a daily intake of 2 to 3 grams of plant sterols or stanols.  Foods with omega-3 fats can help reduce your risk of heart disease. Aim to have a 3.5 oz portion of fatty fish twice per week, such as salmon, mackerel, albacore tuna, sardines, lake trout, or herring. If you wish to take a fish oil supplement, choose one that contains 1 gram of both DHA and EPA.  Limit processed meats to 2 servings (3 oz portion) weekly.  Limit the sodium in your diet to 1500 milligrams (mg) per day. If you have high blood pressure, talk to a registered dietitian about a DASH (Dietary Approaches to Stop Hypertension) eating plan.  Limit sweets and beverages with added sugar, such as soda, to no more than 5 servings per week. One serving is:   1 tablespoon sugar.  1 tablespoon jelly or jam.   cup sorbet.  1 cup lemonade.   cup regular soda. CHOOSING FOODS Starches  Allowed: Breads: All kinds (wheat, rye, raisin, white, oatmeal, [place], [place], and English muffin bread). Low-fat rolls: English muffins, frankfurter and hamburger buns, bagels, pita bread, tortillas (not fried). Pancakes, waffles, biscuits, and muffins made with recommended oil.  Avoid: Products made with saturated or trans fats, oils, or whole milk products. Butter rolls, cheese breads, croissants. Commercial doughnuts, muffins, sweet rolls, biscuits, waffles, pancakes, store-bought mixes. Crackers  Allowed: Low-fat crackers and snacks: Animal, graham, rye, saltine (with  recommended oil, no lard), oyster, and matzo crackers. Bread sticks, melba toast, rusks, flatbread, pretzels, and light popcorn.  Avoid: High-fat crackers: cheese crackers, butter crackers, and those made with coconut, palm oil, or trans fat (hydrogenated oils). Buttered popcorn. Cereals  Allowed: Hot or cold whole-grain cereals.  Avoid: Cereals containing coconut, hydrogenated vegetable fat, or animal fat. Potatoes / Pasta / Rice  Allowed: All kinds of potatoes, rice, and pasta (such as macaroni, spaghetti, and noodles).  Avoid: Pasta or rice prepared with cream sauce or high-fat cheese. Chow mein noodles, [place] fries. Vegetables  Allowed: All vegetables and vegetable juices.  Avoid: Fried vegetables. Vegetables in cream, butter, or high-fat cheese sauces. Limit coconut. Fruit in cream or custard. Protein  Allowed:  Limit your intake of meat, seafood, and poultry to no more than 6 oz (cooked weight) per day. All lean, well-trimmed beef, veal, pork, and lamb. All chicken and [place] without skin. All fish and shellfish. Wild game: wild duck, rabbit, pheasant, and venison. Egg whites or low-cholesterol egg substitutes may be used as desired. Meatless dishes: recipes with dried beans, peas, lentils, and tofu (soybean curd). Seeds and nuts: all seeds and most nuts.  Avoid: Prime grade and other heavily marbled and fatty meats, such as short ribs, spare ribs, rib eye roast or steak, frankfurters, sausage, bacon, and high-fat luncheon meats, mutton. Caviar. Commercially fried fish. Domestic duck, goose, venison sausage. Organ meats: liver, gizzard, heart, chitterlings, brains, kidney, sweetbreads. Dairy  Allowed: Low-fat cheeses: nonfat or low-fat cottage cheese (1% or 2% fat), cheeses made with part skim milk, such as mozzarella, farmers, string, or ricotta. (Cheeses should be labeled no more than 2 to 6 grams fat per oz.). Skim (or 1%) milk: liquid, powdered, or evaporated. Buttermilk made with  low-fat milk. Drinks made with skim or low-fat milk or cocoa. Chocolate milk or cocoa made with skim or low-fat (1%) milk. Nonfat or low-fat yogurt.  Avoid: Whole milk cheeses, including colby, cheddar, mu[place], [address] Jack, [place], [place], [place]ember[place], [address], Swiss, and blue. Creamed cottage cheese, cream cheese. Whole milk and whole milk products, including buttermilk or yogurt made from whole milk, drinks made from whole milk. Condensed milk, evaporated whole milk, and 2% milk. Soups and Combination Foods  Allowed: Low-fat low-sodium soups: broth, dehydrated soups, homemade broth, soups with the fat removed, homemade cream soups made with skim or low-fat milk. Low-fat spaghetti, lasagna, chili, and Spanish rice if low-fat ingredients and low-fat cooking techniques are used.  Avoid: Cream soups made with whole milk, cream, or high-fat cheese. All other soups. Desserts and Sweets  Allowed: Sherbet, fruit ices, gelatins, meringues, and angel food cake. Homemade desserts with recommended fats, oils, and milk products. Jam, jelly, honey, marmalade, sugars, and syrups. Pure sugar candy, such as gum drops, hard candy, jelly beans, marshmallows, mints, and small amounts of dark chocolate.  Avoid: Commercially prepared cakes, pies, cookies, frosting, pudding, or mixes for these products. Desserts containing whole milk products, chocolate, coconut, lard, palm oil, or palm kernel oil. Ice cream or ice cream drinks. Candy that contains chocolate, coconut, butter, hydrogenated fat, or unknown ingredients. Buttered syrups. Fats and Oils  Allowed: Vegetable oils: safflower, sunflower, corn, soybean, cottonseed, sesame, canola, olive, or peanut. Non-hydrogenated margarines. Salad dressing or mayonnaise: homemade or commercial, made with a recommended oil. Low or nonfat salad dressing or mayonnaise.  Limit added fats and oils to 6 to 8 tsp per day (includes fats used in cooking, baking, salads, and spreads on  bread). Remember to count the "hidden fats" in foods.  Avoid: Solid fats and shortenings: butter, lard, salt pork, bacon drippings. Gravy containing meat fat, shortening, or suet. Cocoa butter, coconut. Coconut oil, palm oil, palm kernel oil, or hydrogenated oils: these ingredients are often used in bakery products, nondairy creamers, whipped toppings, candy, and commercially fried foods. Read labels carefully. Salad dressings made of unknown oils, sour cream, or cheese, such as blue cheese and Roquefort. Cream, all kinds: half-and-half, light, heavy, or whipping. Sour cream or cream cheese (even if "light" or low-fat). Nondairy cream substitutes: coffee creamers and sour cream substitutes made with palm, palm kernel, hydrogenated oils, or coconut oil. Beverages  Allowed: Coffee (regular or decaffeinated), tea. Diet carbonated beverages, mineral water. Alcohol: Check with your  caregiver. Moderation is recommended.  Avoid: Whole milk, regular sodas, and juice drinks with added sugar. Condiments  Allowed: All seasonings and condiments. Cocoa powder. "Cream" sauces made with recommended ingredients.  Avoid: Carob powder made with hydrogenated fats. SAMPLE MENU Breakfast   cup orange juice   cup oatmeal  1 slice toast  1 tsp margarine  1 cup skim milk Lunch  [place] sandwich with 2 oz [place], 2 slices bread  Lettuce and tomato slices  Fresh fruit  Carrot sticks  Coffee or tea Snack  Fresh fruit or low-fat crackers Dinner  3 oz lean ground beef  1 baked potato  1 tsp margarine   cup asparagus  Lettuce salad  1 tbs non-creamy dressing   cup peach slices  1 cup skim milk Document Released: 10/19/2007 Document Revised: 07/11/2011 Document Reviewed: 04/04/2011 Exit[employer] Patient Information 2014 [place], [place].

## 2013-06-25 NOTE — Care Management Note (Signed)
    Page 1 of 1   06/25/2013     11:38:43 AM CARE MANAGEMENT NOTE 06/25/2013  Patient:  [name]   Account Number:  [id number]  Date Initiated:  06/25/2013  Documentation initiated by:  Donn Pierini  Subjective/Objective Assessment:   Pt admitted with syncope     Action/Plan:   Anticipated DC Date:  06/25/2013   Anticipated DC Plan:  HOME/SELF CARE         Choice offered to / List presented to:             Status of service:  Completed, signed off Medicare Important Message given?  NA - LOS <3 / Initial given by admissions (If response is "NO", the following Medicare IM given date fields will be blank) Date Medicare IM given:   Date Additional Medicare IM given:    Discharge Disposition:    Per UR Regulation:    If discussed at Long Length of Stay Meetings, dates discussed:    Comments:

## 2013-06-25 NOTE — Progress Notes (Signed)
Called to see Mr. Luttrell for chest pain. His wife is at the bedside. Mr. Prinkey reports an episode of chest "tightness" accompanied by diaphoresis. He denies SOB. Lasted only minutes. Now gone and he states he is back to baseline. BP 133/76 pulse 67. Exam is unremarkable. Telemetry shows SR. ECG shows NSR with no ST-T wave abnormalities.   He reports similar symptoms 3-4 times per day for the past 4-6 weeks. Nonexertional. He denies any alleviating or aggravating factors. He has not identified any triggers. His symptoms last 5 minutes and subside on their own. He has history of CAD, followed by Dr. [name]. Nuclear stress test done Feb 2015 was low risk. Discussed with Dr. Johney Frame. Also discussed with Dr. [name], his primary cardiologist, who will see today and make recommendations regarding further work-up / follow-up if needed.

## 2013-06-25 NOTE — Consult Note (Signed)
Interventional Cardiology Note  Subjective: [name]  is well known to me. Since his bypass operation in 2012 he has had recurring chest pain without evidence of infarction. A relatively recent myocardial perfusion study did not demonstrate any high risk features or evidence of ischemia. With his cognitive impairment, a description of his chest discomfort is very difficult. It is atypical at best.  Objective: The vital signs are stable. The chest is clear The cardiac exam reveals no pericardial friction rub in the neck veins are flat. The pacemaker pocket is soft and nontender. The abdomen is soft. The neurological exam reveals significant cognitive/memory impairment.  The ECG done during ors shortly after chest pain today was unremarkable. I reviewed a myocardial perfusion study from February, 2015.  ASSESSMENT: The patient is having recurring chest pain. This is been a clinical problem for [place] since bypass surgery. I find no evidence of acute ischemia.  Recommendations: I will recommend a conservative approach but if we continue to have discomfort, the symptoms may warrant coronary angiography.  I think you would be safe to discharge the patient today if there no other issues. No further workup for the chest pain at this time.

## 2013-06-30 ENCOUNTER — Other Ambulatory Visit: Payer: Medicare Other

## 2013-07-03 ENCOUNTER — Ambulatory Visit (INDEPENDENT_AMBULATORY_CARE_PROVIDER_SITE_OTHER): Payer: Medicare Other | Admitting: *Deleted

## 2013-07-03 ENCOUNTER — Ambulatory Visit: Payer: Medicare Other

## 2013-07-03 DIAGNOSIS — I455 Other specified heart block: Secondary | ICD-10-CM | POA: Diagnosis not present

## 2013-07-03 LAB — MDC_IDC_ENUM_SESS_TYPE_INCLINIC
Battery Impedance: 100 Ohm
Battery Remaining Longevity: 163 mo
Battery Voltage: 2.8 V
Brady Statistic AP VS Percent: 9 %
Brady Statistic AS VP Percent: 0 %
Date Time Interrogation Session: 20150611165048
Lead Channel Impedance Value: 492 Ohm
Lead Channel Pacing Threshold Amplitude: 0.5 V
Lead Channel Pacing Threshold Amplitude: 0.75 V
Lead Channel Pacing Threshold Pulse Width: 0.4 ms
Lead Channel Sensing Intrinsic Amplitude: 2.8 mV
Lead Channel Sensing Intrinsic Amplitude: 22.4 mV
Lead Channel Setting Pacing Amplitude: 3.5 V
Lead Channel Setting Pacing Amplitude: 3.5 V
Lead Channel Setting Pacing Pulse Width: 0.4 ms
Lead Channel Setting Sensing Sensitivity: 5.6 mV
MDC IDC MSMT LEADCHNL RA PACING THRESHOLD PULSEWIDTH: 0.4 ms
MDC IDC MSMT LEADCHNL RV IMPEDANCE VALUE: 826 Ohm
MDC IDC STAT BRADY AP VP PERCENT: 1 %
MDC IDC STAT BRADY AS VS PERCENT: 91 %

## 2013-07-03 NOTE — Progress Notes (Signed)

## 2013-07-09 ENCOUNTER — Encounter: Payer: Self-pay | Admitting: Internal Medicine

## 2013-07-28 DIAGNOSIS — F0280 Dementia in other diseases classified elsewhere without behavioral disturbance: Secondary | ICD-10-CM | POA: Diagnosis not present

## 2013-07-28 DIAGNOSIS — G309 Alzheimer's disease, unspecified: Secondary | ICD-10-CM | POA: Diagnosis not present

## 2013-07-28 DIAGNOSIS — R51 Headache: Secondary | ICD-10-CM | POA: Diagnosis not present

## 2013-08-05 DIAGNOSIS — R109 Unspecified abdominal pain: Secondary | ICD-10-CM | POA: Diagnosis not present

## 2013-08-06 ENCOUNTER — Other Ambulatory Visit: Payer: Self-pay | Admitting: Family Medicine

## 2013-08-06 DIAGNOSIS — R109 Unspecified abdominal pain: Secondary | ICD-10-CM

## 2013-08-08 ENCOUNTER — Ambulatory Visit: Payer: Medicare Other | Admitting: Interventional Cardiology

## 2013-08-12 ENCOUNTER — Ambulatory Visit
Admission: RE | Admit: 2013-08-12 | Discharge: 2013-08-12 | Disposition: A | Discharge status: Home / Self Care | Payer: Medicare Other | Source: Ambulatory Visit | Attending: Family Medicine | Admitting: Family Medicine

## 2013-08-12 DIAGNOSIS — R109 Unspecified abdominal pain: Secondary | ICD-10-CM

## 2013-08-12 DIAGNOSIS — K7689 Other specified diseases of liver: Secondary | ICD-10-CM | POA: Diagnosis not present

## 2013-08-13 ENCOUNTER — Telehealth: Payer: Self-pay | Admitting: Internal Medicine

## 2013-08-13 NOTE — Telephone Encounter (Signed)
Discussed with Tresa EndoKelly, Dr. Jenel LucksAllred's primary nurse. No need for antibiotics prior to dental cleaning. No contraindication for x rays. Steward DroneBrenda at dental office verbalized understanding.

## 2013-08-13 NOTE — Telephone Encounter (Signed)
New message    Dentist office calling patient appt is now .     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 digital xray

## 2013-08-20 ENCOUNTER — Telehealth: Payer: Self-pay | Admitting: Interventional Cardiology

## 2013-08-20 NOTE — Telephone Encounter (Signed)
Routed to Dr.Smith for adv 

## 2013-08-20 NOTE — Telephone Encounter (Signed)
New message     Can pt take xanax to help relax him to have dental work?

## 2013-08-22 NOTE — Telephone Encounter (Signed)
Okay with me 

## 2013-08-26 NOTE — Telephone Encounter (Signed)
pt wife aware Dr.Smith is ok with pt taking xanax prior to dental procedure.pt wife verbalized understanding.

## 2013-09-25 DIAGNOSIS — F0280 Dementia in other diseases classified elsewhere without behavioral disturbance: Secondary | ICD-10-CM | POA: Diagnosis not present

## 2013-09-25 DIAGNOSIS — I2510 Atherosclerotic heart disease of native coronary artery without angina pectoris: Secondary | ICD-10-CM | POA: Diagnosis not present

## 2013-10-08 DIAGNOSIS — Z23 Encounter for immunization: Secondary | ICD-10-CM | POA: Diagnosis not present

## 2013-10-15 ENCOUNTER — Encounter: Payer: Medicare Other | Admitting: Internal Medicine

## 2013-10-15 ENCOUNTER — Encounter: Payer: Self-pay | Admitting: Internal Medicine

## 2013-10-22 ENCOUNTER — Encounter: Payer: Medicare Other | Admitting: Internal Medicine

## 2013-10-27 DIAGNOSIS — G44219 Episodic tension-type headache, not intractable: Secondary | ICD-10-CM | POA: Diagnosis not present

## 2013-10-27 DIAGNOSIS — G301 Alzheimer's disease with late onset: Secondary | ICD-10-CM | POA: Diagnosis not present

## 2013-11-12 ENCOUNTER — Encounter: Payer: Medicare Other | Admitting: Internal Medicine

## 2013-11-12 ENCOUNTER — Encounter: Payer: Self-pay | Admitting: Internal Medicine

## 2013-11-12 ENCOUNTER — Ambulatory Visit (INDEPENDENT_AMBULATORY_CARE_PROVIDER_SITE_OTHER): Payer: Medicare Other | Admitting: Internal Medicine

## 2013-11-12 VITALS — BP 136/72 | HR 70 | Ht 71.0 in | Wt 237.8 lb

## 2013-11-12 DIAGNOSIS — I2510 Atherosclerotic heart disease of native coronary artery without angina pectoris: Secondary | ICD-10-CM | POA: Diagnosis not present

## 2013-11-12 DIAGNOSIS — I442 Atrioventricular block, complete: Secondary | ICD-10-CM

## 2013-11-12 LAB — MDC_IDC_ENUM_SESS_TYPE_INCLINIC
Battery Impedance: 100 Ohm
Battery Voltage: 2.8 V
Brady Statistic AS VS Percent: 88 %
Date Time Interrogation Session: 20151021130003
Lead Channel Impedance Value: 693 Ohm
Lead Channel Pacing Threshold Amplitude: 0.75 V
Lead Channel Pacing Threshold Pulse Width: 0.4 ms
Lead Channel Setting Pacing Amplitude: 2.5 V
Lead Channel Setting Pacing Pulse Width: 0.4 ms
MDC IDC MSMT BATTERY REMAINING LONGEVITY: 175 mo
MDC IDC MSMT LEADCHNL RA IMPEDANCE VALUE: 432 Ohm
MDC IDC MSMT LEADCHNL RA PACING THRESHOLD AMPLITUDE: 0.75 V
MDC IDC MSMT LEADCHNL RA PACING THRESHOLD PULSEWIDTH: 0.4 ms
MDC IDC MSMT LEADCHNL RA SENSING INTR AMPL: 2 mV
MDC IDC MSMT LEADCHNL RV SENSING INTR AMPL: 22.4 mV
MDC IDC SET LEADCHNL RA PACING AMPLITUDE: 2 V
MDC IDC SET LEADCHNL RV SENSING SENSITIVITY: 5.6 mV
MDC IDC STAT BRADY AP VP PERCENT: 1 %
MDC IDC STAT BRADY AP VS PERCENT: 11 %
MDC IDC STAT BRADY AS VP PERCENT: 0 %

## 2013-11-12 NOTE — Progress Notes (Signed)
PCP: [name], ROBERT L, MD Primary Cardiologist:  Dr Collie SiadSmith  [name] is a 68 y.o. male who presents today for routine electrophysiology followup.  Since his recent ppm implant, the patient reports doing very well.  Today, he denies symptoms of palpitations, chest pain, shortness of breath,  lower extremity edema, dizziness, presyncope, or syncope.  He has arthritic pain within his L shoulder for which he has started PT.  The patient is otherwise without complaint today.   Past Medical History  Diagnosis Date  . OSA (obstructive sleep apnea)   . Essential hypertension, benign   . Hypercholesteremia   . Multinodular goiter   . Colon polyps   . Pulmonary nodules   . Alzheimer's dementia     a. early.  . CHB (complete heart block)   . Other and unspecified hyperlipidemia   . Depressive disorder, not elsewhere classified   . Anxiety state, unspecified   . Esophageal reflux   . Pain in joint, shoulder region     Currently resolved off statin therapy  . Myalgia   . Vitamin D deficiency   . Lesion of lung     Stable and felt inflammatory  . Obesity (BMI 30-39.9)   . CAD (coronary artery disease)     CAD with prior PCI. a. 04/2010 CABG x 5: LIMA->LAD, VG->RI, VG->LCX, VG->RPDA->RPL   Past Surgical History  Procedure Laterality Date  . Achilles tendon repair    . Coronary artery bypass grafting x5 with left  05/17/2010    CABG x 5 with LIMA to LAD, SVG to RI, SVG to Cfx and seq. SVG to PDA and PLOM  . Coronary angioplasty with stent placement  02/1998  . Pacemaker insertion  06-24-2013    MDT ADDRL1 pacemaker implanted by Dr Johney FrameAllred for intermittent CHB associated w syncope    ROS- all systems are reviewed and negative except as per HPI above  Current Outpatient Prescriptions  Medication Sig Dispense Refill  . acetaminophen (TYLENOL) 650 MG CR tablet Take 650 mg by mouth 2 (two) times daily.       [name]. amLODipine (NORVASC) 5 MG tablet Take 5 mg by mouth every morning.       [name]. aspirin  81 MG tablet Take 2 tablets (162 mg total) by mouth every morning.  30 tablet    . donepezil (ARICEPT) 10 MG tablet Take 10 mg by mouth every morning.       . nitroGLYCERIN (NITROSTAT) 0.4 MG SL tablet Place 1 tablet (0.4 mg total) under the tongue every 5 (five) minutes x 3 doses as needed for chest pain.  25 tablet  2  . omeprazole (PRILOSEC) 20 MG capsule Take 20 mg by mouth every evening.       . rosuvastatin (CRESTOR) 40 MG tablet Take 40 mg by mouth every morning.       . sertraline (ZOLOFT) 100 MG tablet Take 50-100 mg by mouth 2 (two) times daily. Patient takes 50 mg in the morning and 100 mg at night       No current facility-administered medications for this visit.    Physical Exam: Filed Vitals:   11/12/13 1250  BP: 136/72  Pulse: 70  Height: 5\' 11"  (1.803 m)  Weight: 237 lb 12.8 oz (107.865 kg)    GEN- The patient is well appearing, alert and oriented x 3 today.   Head- normocephalic, atraumatic Eyes-  Sclera clear, conjunctiva pink Ears- hearing intact Oropharynx- clear Lungs- Clear to ausculation bilaterally, normal  work of breathing Chest- pacemaker pocket is well healed Heart- Regular rate and rhythm, no murmurs, rubs or gallops, PMI not laterally displaced GI- soft, NT, ND, + BS Extremities- no clubbing, cyanosis, or edema  Pacemaker interrogation- reviewed in detail today,  See PACEART report ekg today reveals sinus rhythm, normal ekg  Assessment and Plan:  1. Mobitz II second degree AV block Normal pacemaker function See Pace Art report No changes today  2. OSA Complaint with CPAP  3. CAD Stable No change required today  Merlin remote monitoring Return to see me in 1 year

## 2013-11-12 NOTE — Patient Instructions (Signed)
Your physician wants you to follow-up in: 12 months with Dr Allred You will receive a reminder letter in the mail two months in advance. If you don't receive a letter, please call our office to schedule the follow-up appointment.   Remote monitoring is used to monitor your Pacemaker orICD from home. This monitoring reduces the number of office visits required to check your device to one time per year. It allows us to keep an eye on the functioning of your device to ensure it is working properly. You are scheduled for a device check from home on 02/16/14 You may send your transmission at any time that day. If you have a wireless device, the transmission will be sent automatically. After your physician reviews your transmission, you will receive a postcard with your next transmission date.    

## 2013-11-28 ENCOUNTER — Other Ambulatory Visit: Payer: Medicare Other

## 2013-12-05 ENCOUNTER — Ambulatory Visit: Payer: Medicare Other | Admitting: Interventional Cardiology

## 2014-01-01 ENCOUNTER — Encounter (HOSPITAL_COMMUNITY): Payer: Self-pay | Admitting: Interventional Cardiology

## 2014-01-05 ENCOUNTER — Telehealth: Payer: Self-pay | Admitting: Internal Medicine

## 2014-01-05 NOTE — Telephone Encounter (Signed)
Pt does not have MRI compatible device per PaceArt.

## 2014-01-05 NOTE — Telephone Encounter (Signed)
New message     Pt had pacemaker put in in May.  Can he have an MRI?

## 2014-01-19 ENCOUNTER — Other Ambulatory Visit: Payer: Medicare Other

## 2014-01-21 ENCOUNTER — Other Ambulatory Visit (INDEPENDENT_AMBULATORY_CARE_PROVIDER_SITE_OTHER): Payer: Medicare Other | Admitting: *Deleted

## 2014-01-21 DIAGNOSIS — E7800 Pure hypercholesterolemia, unspecified: Secondary | ICD-10-CM

## 2014-01-21 DIAGNOSIS — I10 Essential (primary) hypertension: Secondary | ICD-10-CM | POA: Diagnosis not present

## 2014-01-21 LAB — BASIC METABOLIC PANEL
BUN: 25 mg/dL — AB (ref 6–23)
CO2: 23 mEq/L (ref 19–32)
Calcium: 8.9 mg/dL (ref 8.4–10.5)
Chloride: 108 mEq/L (ref 96–112)
Creatinine, Ser: 0.8 mg/dL (ref 0.4–1.5)
GFR: 98.22 mL/min (ref >60.00)
Glucose, Bld: 95 mg/dL (ref 70–99)
POTASSIUM: 3.9 meq/L (ref 3.5–5.1)
Sodium: 138 mEq/L (ref 135–145)

## 2014-01-21 LAB — LIPID PANEL
Cholesterol: 127 mg/dL (ref 0–200)
HDL: 36.9 mg/dL — ABNORMAL LOW (ref >39.00)
LDL Cholesterol: 61 mg/dL (ref 0–99)
NonHDL: 90.1
Total CHOL/HDL Ratio: 3
Triglycerides: 147 mg/dL (ref 0.0–149.0)
VLDL: 29.4 mg/dL (ref 0.0–40.0)

## 2014-01-21 LAB — HEPATIC FUNCTION PANEL
ALBUMIN: 4.2 g/dL (ref 3.5–5.2)
ALT: 23 U/L (ref 0–53)
AST: 26 U/L (ref 0–37)
Alkaline Phosphatase: 65 U/L (ref 39–117)
BILIRUBIN TOTAL: 0.6 mg/dL (ref 0.2–1.2)
Bilirubin, Direct: 0.1 mg/dL (ref 0.0–0.3)
Total Protein: 7.5 g/dL (ref 6.0–8.3)

## 2014-01-21 NOTE — Addendum Note (Signed)
Addended by: Tonita PhoenixBOWDEN, Khamya Topp K on: 01/21/2014 07:53 AM   Modules accepted: Orders

## 2014-01-21 NOTE — Addendum Note (Signed)
Addended by: Tonita PhoenixBOWDEN, ROBIN K on: 01/21/2014 07:54 AM   Modules accepted: Orders

## 2014-01-26 ENCOUNTER — Encounter: Payer: Self-pay | Admitting: Interventional Cardiology

## 2014-01-26 ENCOUNTER — Ambulatory Visit (INDEPENDENT_AMBULATORY_CARE_PROVIDER_SITE_OTHER): Payer: Medicare Other | Admitting: Interventional Cardiology

## 2014-01-26 VITALS — BP 118/78 | HR 63 | Ht 71.0 in | Wt 238.0 lb

## 2014-01-26 DIAGNOSIS — G4730 Sleep apnea, unspecified: Secondary | ICD-10-CM

## 2014-01-26 DIAGNOSIS — Z950 Presence of cardiac pacemaker: Secondary | ICD-10-CM | POA: Diagnosis not present

## 2014-01-26 DIAGNOSIS — I25810 Atherosclerosis of coronary artery bypass graft(s) without angina pectoris: Secondary | ICD-10-CM

## 2014-01-26 DIAGNOSIS — I441 Atrioventricular block, second degree: Secondary | ICD-10-CM

## 2014-01-26 DIAGNOSIS — I10 Essential (primary) hypertension: Secondary | ICD-10-CM | POA: Diagnosis not present

## 2014-01-26 NOTE — Progress Notes (Signed)
Patient ID: [name], male   DOB: [birth year], 69 y.o.   MRN: [id number]    [address] [place], [place]  [address] Phone: [phone] Fax:  [phone]  Date:  01/26/2014   ID:  [name], DOB 05-06-1945, MRN [id number]  PCP:  Lenora Boys, MD   ASSESSMENT:  1. CAD with prior coronary bypass grafting and atypical chest pain 2. Chronic diastolic heart failure 3. Beta blocker induced high-grade AV block. Recurred after beta blocker discontinuation and lead to DDD pacemaker insertion (Dr. Johney Frame, 06/2013). 4. Hyperlipidemia 5. Essential hypertension  PLAN:  1. Clinical follow-up in one year 2. Encouraged aerobic activity   SUBJECTIVE: [name] is a 69 y.o. male who is doing well. Only complaint is anterior thigh is comfortable on the right. The discomfort waxes and wanes. 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 He has not had syncope. No palpitations. He denies angina.   Wt Readings from Last 3 Encounters:  01/26/14 238 lb (107.956 kg)  11/12/13 237 lb 12.8 oz (107.865 kg)  06/25/13 228 lb 9.9 oz (103.7 kg)     Past Medical History  Diagnosis Date  . OSA (obstructive sleep apnea)   . Essential hypertension, benign   . Hypercholesteremia   . Multinodular goiter   . Colon polyps   . Pulmonary nodules   . Alzheimer's dementia     a. early.  . CHB (complete heart block)   . Other and unspecified hyperlipidemia   . Depressive disorder, not elsewhere classified   . Anxiety state, unspecified   . Esophageal reflux   . Pain in joint, shoulder region     Currently resolved off statin therapy  . Myalgia   . Vitamin D deficiency   . Lesion of lung     Stable and felt inflammatory  . Obesity (BMI 30-39.9)   . CAD (coronary artery disease)     CAD with prior PCI. a. 04/2010 CABG x 5: LIMA->LAD, VG->RI, VG->LCX, VG->RPDA->RPL    Current Outpatient Prescriptions  Medication Sig Dispense Refill  . acetaminophen  (TYLENOL) 650 MG CR tablet Take 650 mg by mouth 3 (three) times daily.     [name] amLODipine (NORVASC) 5 MG tablet Take 5 mg by mouth every morning.     [name] aspirin 81 MG tablet Take 2 tablets (162 mg total) by mouth every morning. 30 tablet   . benazepril (LOTENSIN) 10 MG tablet Take 10 mg by mouth daily.   0  . donepezil (ARICEPT) 10 MG tablet Take 10 mg by mouth every morning.     . nitroGLYCERIN (NITROSTAT) 0.4 MG SL tablet Place 1 tablet (0.4 mg total) under the tongue every 5 (five) minutes x 3 doses as needed for chest pain. 25 tablet 2  . omeprazole (PRILOSEC) 20 MG capsule Take 20 mg by mouth every evening.     . rosuvastatin (CRESTOR) 40 MG tablet Take 40 mg by mouth every morning.     . sertraline (ZOLOFT) 100 MG tablet Take 50-100 mg by mouth 2 (two) times daily. Patient takes 50 mg in the morning and 100 mg at night     No current facility-administered medications for this visit.    Allergies:    Allergies  Allergen Reactions  . Metoprolol Other (See Comments)    bradycardia / complete heart block    Social History:  The patient  reports that he quit smoking about 35 years ago. His  smoking use included Cigarettes. He smoked 15.00 packs per day. He does not have any smokeless tobacco history on file. He reports that he drinks alcohol. He reports that he does not use illicit drugs.   ROS:  Please see the history of present illness.   No syncope. Denies transient neurological symptoms   All other systems reviewed and negative.   OBJECTIVE: VS:  BP 118/78 mmHg  Pulse 63  Ht  (1.803 m)  Wt 238 lb (107.956 kg)  BMI 33.21 kg/m2 Well nourished, well developed, in no acute distress, obese HEENT: normal Neck: JVD flat. Carotid bruit absent  Cardiac:  normal S1, S2; RRR; no murmur Lungs:  clear to auscultation bilaterally, no wheezing, rhonchi or rales Abd: soft, nontender, no hepatomegaly Ext: Edema absent. Pulses 2+ and symmetric in the upper and lower extremities Skin: warm  and dry Neuro:  CNs 2-12 intact, no focal abnormalities noted  EKG:  Normal sinus rhythm with nonspecific T wave flattening no pacing is noted       Signed, Darci Needle III, MD 01/26/2014 11:08 AM

## 2014-01-26 NOTE — Patient Instructions (Signed)
Your physician recommends that you continue on your current medications as directed. Please refer to the Current Medication list given to you today.  Your physician wants you to follow-up in: 1 year with Dr.Smith You will receive a reminder letter in the mail two months in advance. If you don't receive a letter, please call our office to schedule the follow-up appointment.  

## 2014-02-16 ENCOUNTER — Encounter: Payer: Medicare Other | Admitting: *Deleted

## 2014-02-16 ENCOUNTER — Telehealth: Payer: Self-pay | Admitting: Cardiology

## 2014-02-16 NOTE — Telephone Encounter (Signed)
LMOVM reminding pt to send remote transmission.   

## 2014-02-17 ENCOUNTER — Telehealth: Payer: Self-pay | Admitting: Internal Medicine

## 2014-02-17 NOTE — Telephone Encounter (Signed)
New Message         Pt's wife calling stating that [facility] to pt's remote pacer chk. Please call back and advise.

## 2014-02-17 NOTE — Telephone Encounter (Signed)
Spoke w/ pt daughter and she said would send remote transmission either today or next week.

## 2014-03-02 DIAGNOSIS — G301 Alzheimer's disease with late onset: Secondary | ICD-10-CM | POA: Diagnosis not present

## 2014-03-02 DIAGNOSIS — G4700 Insomnia, unspecified: Secondary | ICD-10-CM | POA: Diagnosis not present

## 2014-03-02 DIAGNOSIS — R51 Headache: Secondary | ICD-10-CM | POA: Diagnosis not present

## 2014-03-11 ENCOUNTER — Ambulatory Visit (INDEPENDENT_AMBULATORY_CARE_PROVIDER_SITE_OTHER): Payer: Medicare Other | Admitting: *Deleted

## 2014-03-11 DIAGNOSIS — I442 Atrioventricular block, complete: Secondary | ICD-10-CM | POA: Diagnosis not present

## 2014-03-11 LAB — MDC_IDC_ENUM_SESS_TYPE_REMOTE
Battery Remaining Longevity: 162 mo
Battery Voltage: 2.8 V
Brady Statistic AP VP Percent: 1 %
Brady Statistic AP VS Percent: 10 %
Brady Statistic AS VP Percent: 0 %
Lead Channel Impedance Value: 470 Ohm
Lead Channel Impedance Value: 654 Ohm
Lead Channel Pacing Threshold Amplitude: 0.625 V
Lead Channel Pacing Threshold Pulse Width: 0.4 ms
Lead Channel Sensing Intrinsic Amplitude: 16 mV
Lead Channel Sensing Intrinsic Amplitude: 2.8 mV
Lead Channel Setting Pacing Amplitude: 2 V
Lead Channel Setting Pacing Amplitude: 2.5 V
Lead Channel Setting Pacing Pulse Width: 0.4 ms
Lead Channel Setting Sensing Sensitivity: 5.6 mV
MDC IDC MSMT BATTERY IMPEDANCE: 133 Ohm
MDC IDC MSMT LEADCHNL RV PACING THRESHOLD AMPLITUDE: 0.625 V
MDC IDC MSMT LEADCHNL RV PACING THRESHOLD PULSEWIDTH: 0.4 ms
MDC IDC SESS DTM: 20160217155840
MDC IDC STAT BRADY AS VS PERCENT: 89 %

## 2014-03-11 NOTE — Progress Notes (Signed)
Remote pacemaker transmission.   

## 2014-03-23 ENCOUNTER — Encounter: Payer: Self-pay | Admitting: Cardiology

## 2014-03-30 ENCOUNTER — Encounter: Payer: Self-pay | Admitting: Internal Medicine

## 2014-06-01 DIAGNOSIS — G301 Alzheimer's disease with late onset: Secondary | ICD-10-CM | POA: Diagnosis not present

## 2014-06-01 DIAGNOSIS — R51 Headache: Secondary | ICD-10-CM | POA: Diagnosis not present

## 2014-06-10 ENCOUNTER — Encounter: Payer: Medicare Other | Admitting: *Deleted

## 2014-06-10 ENCOUNTER — Telehealth: Payer: Self-pay | Admitting: Cardiology

## 2014-06-10 NOTE — Telephone Encounter (Signed)
LMOVM reminding pt to send remote transmission.   

## 2014-06-12 ENCOUNTER — Encounter: Payer: Self-pay | Admitting: Cardiology

## 2014-07-02 ENCOUNTER — Other Ambulatory Visit: Payer: Self-pay | Admitting: Neurology

## 2014-07-02 DIAGNOSIS — R519 Headache, unspecified: Secondary | ICD-10-CM

## 2014-07-02 DIAGNOSIS — R51 Headache: Principal | ICD-10-CM

## 2014-07-10 ENCOUNTER — Ambulatory Visit
Admission: RE | Admit: 2014-07-10 | Discharge: 2014-07-10 | Disposition: A | Discharge status: Home / Self Care | Payer: Medicare Other | Source: Ambulatory Visit | Attending: Neurology | Admitting: Neurology

## 2014-07-10 DIAGNOSIS — R519 Headache, unspecified: Secondary | ICD-10-CM

## 2014-07-10 DIAGNOSIS — G309 Alzheimer's disease, unspecified: Secondary | ICD-10-CM | POA: Diagnosis not present

## 2014-07-10 DIAGNOSIS — R51 Headache: Principal | ICD-10-CM

## 2014-07-16 DIAGNOSIS — H01001 Unspecified blepharitis right upper eyelid: Secondary | ICD-10-CM | POA: Diagnosis not present

## 2014-07-16 DIAGNOSIS — H01004 Unspecified blepharitis left upper eyelid: Secondary | ICD-10-CM | POA: Diagnosis not present

## 2014-07-16 DIAGNOSIS — H01005 Unspecified blepharitis left lower eyelid: Secondary | ICD-10-CM | POA: Diagnosis not present

## 2014-07-16 DIAGNOSIS — H2513 Age-related nuclear cataract, bilateral: Secondary | ICD-10-CM | POA: Diagnosis not present

## 2014-07-16 DIAGNOSIS — H01002 Unspecified blepharitis right lower eyelid: Secondary | ICD-10-CM | POA: Diagnosis not present

## 2014-07-24 ENCOUNTER — Encounter: Payer: Self-pay | Admitting: *Deleted

## 2014-08-03 ENCOUNTER — Telehealth: Payer: Self-pay | Admitting: Internal Medicine

## 2014-08-03 NOTE — Telephone Encounter (Signed)
New message      Pt received letter in mail regarding device check.  Not sure whether they need to schedule appt to be seen or if it is for a remote check.  Please advise

## 2014-08-03 NOTE — Telephone Encounter (Signed)
Spoke w/ pt wife and informed her that pt needed to remote transmission b/c he missed his in May. She verbalized understanding.

## 2014-08-03 NOTE — Telephone Encounter (Signed)
LMOVM for pt to return call 

## 2014-08-04 ENCOUNTER — Ambulatory Visit (INDEPENDENT_AMBULATORY_CARE_PROVIDER_SITE_OTHER): Payer: Medicare Other | Admitting: *Deleted

## 2014-08-04 DIAGNOSIS — I442 Atrioventricular block, complete: Secondary | ICD-10-CM

## 2014-08-06 DIAGNOSIS — G301 Alzheimer's disease with late onset: Secondary | ICD-10-CM | POA: Diagnosis not present

## 2014-08-06 DIAGNOSIS — R51 Headache: Secondary | ICD-10-CM | POA: Diagnosis not present

## 2014-08-06 NOTE — Progress Notes (Signed)
Remote pacemaker transmission.   

## 2014-08-10 LAB — CUP PACEART REMOTE DEVICE CHECK
Battery Remaining Longevity: 157 mo
Battery Voltage: 2.8 V
Brady Statistic AP VP Percent: 1 %
Brady Statistic AP VS Percent: 9 %
Brady Statistic AS VP Percent: 0 %
Brady Statistic AS VS Percent: 90 %
Date Time Interrogation Session: 20160712172851
Lead Channel Impedance Value: 670 Ohm
Lead Channel Pacing Threshold Amplitude: 0.625 V
Lead Channel Pacing Threshold Amplitude: 0.625 V
Lead Channel Pacing Threshold Pulse Width: 0.4 ms
Lead Channel Pacing Threshold Pulse Width: 0.4 ms
Lead Channel Setting Pacing Amplitude: 2 V
Lead Channel Setting Sensing Sensitivity: 5.6 mV
MDC IDC MSMT BATTERY IMPEDANCE: 110 Ohm
MDC IDC MSMT LEADCHNL RA IMPEDANCE VALUE: 458 Ohm
MDC IDC MSMT LEADCHNL RA SENSING INTR AMPL: 2.8 mV
MDC IDC MSMT LEADCHNL RV SENSING INTR AMPL: 16 mV
MDC IDC SET LEADCHNL RV PACING AMPLITUDE: 2.5 V
MDC IDC SET LEADCHNL RV PACING PULSEWIDTH: 0.4 ms

## 2014-08-14 ENCOUNTER — Ambulatory Visit (INDEPENDENT_AMBULATORY_CARE_PROVIDER_SITE_OTHER): Payer: Medicare Other | Admitting: Podiatry

## 2014-08-14 DIAGNOSIS — I25810 Atherosclerosis of coronary artery bypass graft(s) without angina pectoris: Secondary | ICD-10-CM | POA: Diagnosis not present

## 2014-08-14 DIAGNOSIS — M79676 Pain in unspecified toe(s): Secondary | ICD-10-CM

## 2014-08-14 DIAGNOSIS — B351 Tinea unguium: Secondary | ICD-10-CM | POA: Diagnosis not present

## 2014-08-14 DIAGNOSIS — M79674 Pain in right toe(s): Secondary | ICD-10-CM

## 2014-08-14 DIAGNOSIS — M79675 Pain in left toe(s): Secondary | ICD-10-CM

## 2014-08-14 NOTE — Progress Notes (Signed)
Patient ID: [name], male   DOB: [birth year], 69 y.o.   MRN: [id number] Complaint:  Visit Type: Patient returns to my office for continued evaluation of his feet.  He has alzheimers and says there is pain due to to his toes.. Complaint: Patient states" my nails have grown long and thick and become painful to walk and wear shoes" . The patient presents for preventative foot care services.  Vascular: dorsalis pedis and posterior tibial pulses are palpable bilateral. Capillary return is immediate. Temperature gradient is WNL. Skin turgor WNL  Sensorium: Normal Semmes Weinstein monofilament test. Normal tactile sensation bilaterally. Nail Exam: Pt has thick disfigured discolored nails with subungual debris noted bilateral entire nail hallux  Ulcer Exam: There is no evidence of ulcer or pre-ulcerative changes or infection. Orthopedic Exam: Muscle tone and strength are WNL. No limitations in general ROM. No crepitus or effusions noted. Foot type and digits show no abnormalities. Bony prominences are unremarkable. Asymptomatic HAV. Skin: No Porokeratosis. No infection or ulcers.  Pinch callus right hallux.  Diagnosis:  Onychomycosis, , Pain in right toe, pain in left toes  Treatment & Plan Procedures and Treatment: Consent by patient was obtained for treatment procedures. The patient understood the discussion of treatment and procedures well. All questions were answered thoroughly reviewed. Debridement of mycotic and hypertrophic toenails, 1 through 5 bilateral and clearing of subungual debris. No ulceration, no infection noted.  Return Visit-Office Procedure: Patient instructed to return to the office for a follow up visit 3 months for continued evaluation and treatment.

## 2014-08-24 ENCOUNTER — Encounter: Payer: Self-pay | Admitting: Internal Medicine

## 2014-08-28 ENCOUNTER — Encounter: Payer: Self-pay | Admitting: Cardiology

## 2014-09-04 ENCOUNTER — Encounter: Payer: Self-pay | Admitting: Internal Medicine

## 2014-09-11 DIAGNOSIS — R079 Chest pain, unspecified: Secondary | ICD-10-CM | POA: Diagnosis not present

## 2014-09-22 DIAGNOSIS — I10 Essential (primary) hypertension: Secondary | ICD-10-CM | POA: Diagnosis not present

## 2014-09-22 DIAGNOSIS — Z0000 Encounter for general adult medical examination without abnormal findings: Secondary | ICD-10-CM | POA: Diagnosis not present

## 2014-09-22 DIAGNOSIS — E782 Mixed hyperlipidemia: Secondary | ICD-10-CM | POA: Diagnosis not present

## 2014-09-22 DIAGNOSIS — F322 Major depressive disorder, single episode, severe without psychotic features: Secondary | ICD-10-CM | POA: Diagnosis not present

## 2014-09-25 ENCOUNTER — Encounter: Payer: Self-pay | Admitting: Interventional Cardiology

## 2014-11-06 DIAGNOSIS — G301 Alzheimer's disease with late onset: Secondary | ICD-10-CM | POA: Diagnosis not present

## 2014-11-06 DIAGNOSIS — G44219 Episodic tension-type headache, not intractable: Secondary | ICD-10-CM | POA: Diagnosis not present

## 2014-11-16 ENCOUNTER — Ambulatory Visit (INDEPENDENT_AMBULATORY_CARE_PROVIDER_SITE_OTHER): Payer: Medicare Other | Admitting: Internal Medicine

## 2014-11-16 ENCOUNTER — Encounter: Payer: Self-pay | Admitting: Internal Medicine

## 2014-11-16 VITALS — BP 150/88 | HR 70 | Ht 71.0 in | Wt 240.2 lb

## 2014-11-16 DIAGNOSIS — I441 Atrioventricular block, second degree: Secondary | ICD-10-CM

## 2014-11-16 DIAGNOSIS — Z950 Presence of cardiac pacemaker: Secondary | ICD-10-CM

## 2014-11-16 DIAGNOSIS — I10 Essential (primary) hypertension: Secondary | ICD-10-CM | POA: Diagnosis not present

## 2014-11-16 DIAGNOSIS — I25810 Atherosclerosis of coronary artery bypass graft(s) without angina pectoris: Secondary | ICD-10-CM

## 2014-11-16 DIAGNOSIS — I455 Other specified heart block: Secondary | ICD-10-CM

## 2014-11-16 LAB — CUP PACEART INCLINIC DEVICE CHECK
Battery Voltage: 2.8 V
Brady Statistic AP VS Percent: 9 %
Brady Statistic AS VP Percent: 0 %
Brady Statistic AS VS Percent: 90 %
Implantable Lead Implant Date: 20150602
Implantable Lead Location: 753859
Implantable Lead Model: 5076
Implantable Lead Model: 5076
Lead Channel Impedance Value: 462 Ohm
Lead Channel Impedance Value: 664 Ohm
Lead Channel Pacing Threshold Amplitude: 0.75 V
Lead Channel Pacing Threshold Pulse Width: 0.4 ms
Lead Channel Pacing Threshold Pulse Width: 0.4 ms
Lead Channel Sensing Intrinsic Amplitude: 22.4 mV
Lead Channel Setting Pacing Amplitude: 2 V
Lead Channel Setting Pacing Pulse Width: 0.4 ms
Lead Channel Setting Sensing Sensitivity: 5.6 mV
MDC IDC LEAD IMPLANT DT: 20150602
MDC IDC LEAD LOCATION: 753860
MDC IDC MSMT BATTERY IMPEDANCE: 110 Ohm
MDC IDC MSMT BATTERY REMAINING LONGEVITY: 157 mo
MDC IDC MSMT LEADCHNL RA PACING THRESHOLD AMPLITUDE: 0.75 V
MDC IDC MSMT LEADCHNL RA SENSING INTR AMPL: 2 mV
MDC IDC SESS DTM: 20161024111726
MDC IDC SET LEADCHNL RV PACING AMPLITUDE: 2.5 V
MDC IDC STAT BRADY AP VP PERCENT: 1 %

## 2014-11-16 NOTE — Patient Instructions (Signed)
Medication Instructions:  Your physician recommends that you continue on your current medications as directed. Please refer to the Current Medication list given to you today.   Labwork: None ordered   Testing/Procedures: None ordered   Follow-Up: Your physician wants you to follow-up in: 12 months with Amber Seiler, NP You will receive a reminder letter in the mail two months in advance. If you don't receive a letter, please call our office to schedule the follow-up appointment.  Remote monitoring is used to monitor your Pacemaker  from home. This monitoring reduces the number of office visits required to check your device to one time per year. It allows us to keep an eye on the functioning of your device to ensure it is working properly. You are scheduled for a device check from home on 02/15/15. You may send your transmission at any time that day. If you have a wireless device, the transmission will be sent automatically. After your physician reviews your transmission, you will receive a postcard with your next transmission date.     Any Other Special Instructions Will Be Listed Below (If Applicable).   

## 2014-11-16 NOTE — Progress Notes (Signed)
PCP: [name], ROBERT L, MD Primary Cardiologist:  Dr Collie SiadSmith  [name] is a 69 y.o. male who presents today for routine electrophysiology followup.  He has advanced dementia and [facility]  He is accompanied by his wife today. Today, he denies symptoms of palpitations, chest pain, shortness of breath,  lower extremity edema, dizziness, presyncope, or syncope.  The patient is otherwise without complaint today.   Past Medical History  Diagnosis Date  . OSA (obstructive sleep apnea)   . Essential hypertension, benign   . Hypercholesteremia   . Multinodular goiter   . Colon polyps   . Pulmonary nodules   . Alzheimer's dementia     a. early.  . CHB (complete heart block) (HCC)   . Other and unspecified hyperlipidemia   . Depressive disorder, not elsewhere classified   . Anxiety state, unspecified   . Esophageal reflux   . Pain in joint, shoulder region     Currently resolved off statin therapy  . Myalgia   . Vitamin D deficiency   . Lesion of lung     Stable and felt inflammatory  . Obesity (BMI 30-39.9)   . CAD (coronary artery disease)     CAD with prior PCI. a. 04/2010 CABG x 5: LIMA->LAD, VG->RI, VG->LCX, VG->RPDA->RPL   Past Surgical History  Procedure Laterality Date  . Achilles tendon repair    . Coronary artery bypass grafting x5 with left  05/17/2010    CABG x 5 with LIMA to LAD, SVG to RI, SVG to Cfx and seq. SVG to PDA and PLOM  . Coronary angioplasty with stent placement  02/1998  . Pacemaker insertion  06-24-2013    MDT ADDRL1 pacemaker implanted by Dr Johney FrameAllred for intermittent CHB associated w syncope  . Temporary pacemaker insertion N/A 05/11/2013    Procedure: TEMPORARY PACEMAKER INSERTION;  Surgeon: Corky CraftsJayadeep S Varanasi, MD;  [facility];  Service: Cardiovascular;  Laterality: N/A;  . Permanent pacemaker insertion N/A 06/24/2013    Procedure: PERMANENT PACEMAKER INSERTION;  Surgeon: Gardiner RhymeJames D Iliani Vejar, MD;  Location: [facility] CATH LAB;  Service:  Cardiovascular;  Laterality: N/A;    ROS- all systems are reviewed and negative except as per HPI above  Current Outpatient Prescriptions  Medication Sig Dispense Refill  . acetaminophen (TYLENOL) 650 MG CR tablet Take 650 mg by mouth 3 (three) times daily.     [name]. amLODipine (NORVASC) 5 MG tablet Take 5 mg by mouth every morning.     [name]. aspirin 81 MG tablet Take by mouth daily.    . benazepril (LOTENSIN) 10 MG tablet Take 10 mg by mouth daily.   0  . donepezil (ARICEPT) 10 MG tablet Take 10 mg by mouth every morning.     . nitroGLYCERIN (NITROSTAT) 0.4 MG SL tablet Place 1 tablet (0.4 mg total) under the tongue every 5 (five) minutes x 3 doses as needed for chest pain. 25 tablet 2  . omeprazole (PRILOSEC) 20 MG capsule Take 20 mg by mouth every evening.     . rosuvastatin (CRESTOR) 40 MG tablet Take 40 mg by mouth every morning.     . sertraline (ZOLOFT) 100 MG tablet Take 50-100 mg by mouth 2 (two) times daily. Patient takes 50 mg in the morning and 100 mg at night     No current facility-administered medications for this visit.    Physical Exam: Filed Vitals:   11/16/14 0934  BP: 150/88  Pulse: 70  Height: 5\' 11"  (1.803  m)  Weight: 240 lb 3.2 oz (108.954 kg)  SpO2: 97%    GEN- The patient is well appearing, alert  Head- normocephalic, atraumatic Eyes-  Sclera clear, conjunctiva pink Ears- hearing intact Oropharynx- clear Lungs- Clear to ausculation bilaterally, normal work of breathing Chest- pacemaker pocket is well healed Heart- Regular rate and rhythm, no murmurs, rubs or gallops, PMI not laterally displaced GI- soft, NT, ND, + BS Extremities- no clubbing, cyanosis, or edema  Pacemaker interrogation- reviewed in detail today,  See PACEART report  Assessment and Plan:  1. Mobitz II second degree AV block Normal pacemaker function See Pace Art report No changes today  2. OSA Complaint with CPAP  3. CAD Stable No change required today  4. HTN Stable No  change required today  Merlin remote monitoring Return to see EP NP in 1 year  Follow-up with Dr [name] as scheduled

## 2014-12-04 ENCOUNTER — Ambulatory Visit: Payer: Medicare Other | Admitting: Podiatry

## 2014-12-10 ENCOUNTER — Encounter: Payer: Self-pay | Admitting: Podiatry

## 2014-12-10 ENCOUNTER — Ambulatory Visit (INDEPENDENT_AMBULATORY_CARE_PROVIDER_SITE_OTHER): Payer: Medicare Other | Admitting: Podiatry

## 2014-12-10 DIAGNOSIS — M79674 Pain in right toe(s): Secondary | ICD-10-CM | POA: Diagnosis not present

## 2014-12-10 DIAGNOSIS — B351 Tinea unguium: Secondary | ICD-10-CM

## 2014-12-10 DIAGNOSIS — M79675 Pain in left toe(s): Secondary | ICD-10-CM

## 2014-12-10 DIAGNOSIS — L84 Corns and callosities: Secondary | ICD-10-CM

## 2014-12-10 DIAGNOSIS — M79676 Pain in unspecified toe(s): Secondary | ICD-10-CM | POA: Diagnosis not present

## 2014-12-10 NOTE — Progress Notes (Signed)
Patient ID: [name], male   DOB: [birth year], 69 y.o.   MRN: [id number] Complaint:  Visit Type: Patient returns to my office for continued evaluation of his feet.  He has alzheimers and says there is pain due to to his toes.. Complaint: Patient states" my nails have grown long and thick and become painful to walk and wear shoes" . The patient presents for preventative foot care services.  Vascular: dorsalis pedis and posterior tibial pulses are palpable bilateral. Capillary return is immediate. Temperature gradient is WNL. Skin turgor WNL  Sensorium: Normal Semmes Weinstein monofilament test. Normal tactile sensation bilaterally. Nail Exam: Pt has thick disfigured discolored nails with subungual debris noted bilateral entire nail hallux  Ulcer Exam: There is no evidence of ulcer or pre-ulcerative changes or infection. Orthopedic Exam: Muscle tone and strength are WNL. No limitations in general ROM. No crepitus or effusions noted. Foot type and digits show no abnormalities. Bony prominences are unremarkable. Asymptomatic HAV. Skin: No Porokeratosis. No infection or ulcers.  Pinch callus right hallux.  Diagnosis:  Onychomycosis, , Pain in right toe, pain in left toes  Treatment & Plan Procedures and Treatment: Consent by patient was obtained for treatment procedures. The patient understood the discussion of treatment and procedures well. All questions were answered thoroughly reviewed. Debridement of mycotic and hypertrophic toenails, 1 through 5 bilateral and clearing of subungual debris. No ulceration, no infection noted.  Return Visit-Office Procedure: Patient instructed to return to the office for a follow up visit 3 months for continued evaluation and treatment.  Helane GuntherGregory Calyse Murcia DPM

## 2015-01-19 DIAGNOSIS — R41841 Cognitive communication deficit: Secondary | ICD-10-CM | POA: Diagnosis not present

## 2015-01-26 DIAGNOSIS — F329 Major depressive disorder, single episode, unspecified: Secondary | ICD-10-CM | POA: Diagnosis not present

## 2015-01-26 DIAGNOSIS — I10 Essential (primary) hypertension: Secondary | ICD-10-CM | POA: Diagnosis not present

## 2015-01-26 DIAGNOSIS — M5416 Radiculopathy, lumbar region: Secondary | ICD-10-CM | POA: Diagnosis not present

## 2015-01-26 DIAGNOSIS — R41841 Cognitive communication deficit: Secondary | ICD-10-CM | POA: Diagnosis not present

## 2015-01-26 DIAGNOSIS — K219 Gastro-esophageal reflux disease without esophagitis: Secondary | ICD-10-CM | POA: Diagnosis not present

## 2015-01-28 DIAGNOSIS — R41841 Cognitive communication deficit: Secondary | ICD-10-CM | POA: Diagnosis not present

## 2015-02-02 DIAGNOSIS — R41841 Cognitive communication deficit: Secondary | ICD-10-CM | POA: Diagnosis not present

## 2015-02-04 DIAGNOSIS — R41841 Cognitive communication deficit: Secondary | ICD-10-CM | POA: Diagnosis not present

## 2015-02-09 DIAGNOSIS — R41841 Cognitive communication deficit: Secondary | ICD-10-CM | POA: Diagnosis not present

## 2015-02-15 ENCOUNTER — Encounter: Payer: Medicare Other | Admitting: *Deleted

## 2015-02-15 DIAGNOSIS — R41841 Cognitive communication deficit: Secondary | ICD-10-CM | POA: Diagnosis not present

## 2015-02-16 DIAGNOSIS — R41841 Cognitive communication deficit: Secondary | ICD-10-CM | POA: Diagnosis not present

## 2015-02-17 ENCOUNTER — Encounter: Payer: Self-pay | Admitting: Cardiology

## 2015-02-18 DIAGNOSIS — R41841 Cognitive communication deficit: Secondary | ICD-10-CM | POA: Diagnosis not present

## 2015-02-23 DIAGNOSIS — R41841 Cognitive communication deficit: Secondary | ICD-10-CM | POA: Diagnosis not present

## 2015-02-24 ENCOUNTER — Ambulatory Visit (INDEPENDENT_AMBULATORY_CARE_PROVIDER_SITE_OTHER): Payer: Medicare Other | Admitting: *Deleted

## 2015-02-24 DIAGNOSIS — I442 Atrioventricular block, complete: Secondary | ICD-10-CM

## 2015-02-24 NOTE — Progress Notes (Signed)
Remote pacemaker transmission.   

## 2015-02-25 DIAGNOSIS — R41841 Cognitive communication deficit: Secondary | ICD-10-CM | POA: Diagnosis not present

## 2015-03-02 DIAGNOSIS — R41841 Cognitive communication deficit: Secondary | ICD-10-CM | POA: Diagnosis not present

## 2015-03-04 DIAGNOSIS — R41841 Cognitive communication deficit: Secondary | ICD-10-CM | POA: Diagnosis not present

## 2015-03-09 DIAGNOSIS — R41841 Cognitive communication deficit: Secondary | ICD-10-CM | POA: Diagnosis not present

## 2015-03-11 DIAGNOSIS — R41841 Cognitive communication deficit: Secondary | ICD-10-CM | POA: Diagnosis not present

## 2015-03-12 ENCOUNTER — Encounter: Payer: Self-pay | Admitting: Cardiology

## 2015-03-12 LAB — CUP PACEART REMOTE DEVICE CHECK
Battery Impedance: 134 Ohm
Battery Voltage: 2.8 V
Brady Statistic AP VP Percent: 1 %
Brady Statistic AP VS Percent: 11 %
Brady Statistic AS VS Percent: 88 %
Date Time Interrogation Session: 20170201131559
Implantable Lead Implant Date: 20150602
Implantable Lead Implant Date: 20150602
Implantable Lead Location: 753859
Implantable Lead Model: 5076
Lead Channel Impedance Value: 450 Ohm
Lead Channel Impedance Value: 652 Ohm
Lead Channel Sensing Intrinsic Amplitude: 1.4 mV
Lead Channel Setting Pacing Pulse Width: 0.4 ms
MDC IDC LEAD LOCATION: 753860
MDC IDC MSMT BATTERY REMAINING LONGEVITY: 150 mo
MDC IDC MSMT LEADCHNL RA PACING THRESHOLD AMPLITUDE: 0.75 V
MDC IDC MSMT LEADCHNL RA PACING THRESHOLD PULSEWIDTH: 0.4 ms
MDC IDC MSMT LEADCHNL RV PACING THRESHOLD AMPLITUDE: 0.625 V
MDC IDC MSMT LEADCHNL RV PACING THRESHOLD PULSEWIDTH: 0.4 ms
MDC IDC MSMT LEADCHNL RV SENSING INTR AMPL: 16 mV
MDC IDC SET LEADCHNL RA PACING AMPLITUDE: 2 V
MDC IDC SET LEADCHNL RV PACING AMPLITUDE: 2.5 V
MDC IDC SET LEADCHNL RV SENSING SENSITIVITY: 5.6 mV
MDC IDC STAT BRADY AS VP PERCENT: 0 %

## 2015-03-16 DIAGNOSIS — R41841 Cognitive communication deficit: Secondary | ICD-10-CM | POA: Diagnosis not present

## 2015-03-18 DIAGNOSIS — R05 Cough: Secondary | ICD-10-CM | POA: Diagnosis not present

## 2015-03-18 DIAGNOSIS — J111 Influenza due to unidentified influenza virus with other respiratory manifestations: Secondary | ICD-10-CM | POA: Diagnosis not present

## 2015-03-18 DIAGNOSIS — R079 Chest pain, unspecified: Secondary | ICD-10-CM | POA: Diagnosis not present

## 2015-03-18 DIAGNOSIS — R41841 Cognitive communication deficit: Secondary | ICD-10-CM | POA: Diagnosis not present

## 2015-03-23 DIAGNOSIS — R41841 Cognitive communication deficit: Secondary | ICD-10-CM | POA: Diagnosis not present

## 2015-03-23 DIAGNOSIS — M5441 Lumbago with sciatica, right side: Secondary | ICD-10-CM | POA: Diagnosis not present

## 2015-03-23 DIAGNOSIS — M5136 Other intervertebral disc degeneration, lumbar region: Secondary | ICD-10-CM | POA: Diagnosis not present

## 2015-03-23 DIAGNOSIS — G8929 Other chronic pain: Secondary | ICD-10-CM | POA: Diagnosis not present

## 2015-03-25 ENCOUNTER — Encounter: Payer: Self-pay | Admitting: Podiatry

## 2015-03-25 ENCOUNTER — Ambulatory Visit (INDEPENDENT_AMBULATORY_CARE_PROVIDER_SITE_OTHER): Payer: Medicare Other | Admitting: Podiatry

## 2015-03-25 DIAGNOSIS — L84 Corns and callosities: Secondary | ICD-10-CM | POA: Diagnosis not present

## 2015-03-25 DIAGNOSIS — B351 Tinea unguium: Secondary | ICD-10-CM | POA: Diagnosis not present

## 2015-03-25 DIAGNOSIS — M79676 Pain in unspecified toe(s): Secondary | ICD-10-CM | POA: Diagnosis not present

## 2015-03-25 DIAGNOSIS — M79674 Pain in right toe(s): Secondary | ICD-10-CM

## 2015-03-25 DIAGNOSIS — M79675 Pain in left toe(s): Secondary | ICD-10-CM | POA: Diagnosis not present

## 2015-03-25 NOTE — Progress Notes (Signed)
Patient ID: [name], male   DOB: 04/03/1945, 69 y.o.   MRN: [id number] Complaint:  Visit Type: Patient returns to my office for continued evaluation of his feet.  He has alzheimers and says there is pain due to to his toes.. Complaint: Patient states" my nails have grown long and thick and become painful to walk and wear shoes" . The patient presents for preventative foot care services.  Vascular: dorsalis pedis and posterior tibial pulses are palpable bilateral. Capillary return is immediate. Temperature gradient is WNL. Skin turgor WNL  Sensorium: Normal Semmes Weinstein monofilament test. Normal tactile sensation bilaterally. Nail Exam: Pt has thick disfigured discolored nails with subungual debris noted bilateral entire nail hallux  Ulcer Exam: There is no evidence of ulcer or pre-ulcerative changes or infection. Orthopedic Exam: Muscle tone and strength are WNL. No limitations in general ROM. No crepitus or effusions noted. Foot type and digits show no abnormalities. Bony prominences are unremarkable. Asymptomatic HAV. Skin: No Porokeratosis. No infection or ulcers.  Pinch callus right hallux.  Diagnosis:  Onychomycosis, , Pain in right toe, pain in left toes  Treatment & Plan Procedures and Treatment: Consent by patient was obtained for treatment procedures. The patient understood the discussion of treatment and procedures well. All questions were answered thoroughly reviewed. Debridement of mycotic and hypertrophic toenails, 1 through 5 bilateral and clearing of subungual debris. No ulceration, no infection noted.  Return Visit-Office Procedure: Patient instructed to return to the office for a follow up visit 3 months for continued evaluation and treatment.  Avelino Herren DPM 

## 2015-03-26 DIAGNOSIS — G44219 Episodic tension-type headache, not intractable: Secondary | ICD-10-CM | POA: Diagnosis not present

## 2015-03-26 DIAGNOSIS — G301 Alzheimer's disease with late onset: Secondary | ICD-10-CM | POA: Diagnosis not present

## 2015-03-29 DIAGNOSIS — M25562 Pain in left knee: Secondary | ICD-10-CM | POA: Diagnosis not present

## 2015-03-29 DIAGNOSIS — M25561 Pain in right knee: Secondary | ICD-10-CM | POA: Diagnosis not present

## 2015-03-29 DIAGNOSIS — M545 Low back pain: Secondary | ICD-10-CM | POA: Diagnosis not present

## 2015-03-29 DIAGNOSIS — M79651 Pain in right thigh: Secondary | ICD-10-CM | POA: Diagnosis not present

## 2015-03-29 DIAGNOSIS — M5441 Lumbago with sciatica, right side: Secondary | ICD-10-CM | POA: Diagnosis not present

## 2015-03-29 DIAGNOSIS — M5136 Other intervertebral disc degeneration, lumbar region: Secondary | ICD-10-CM | POA: Diagnosis not present

## 2015-03-29 DIAGNOSIS — R41841 Cognitive communication deficit: Secondary | ICD-10-CM | POA: Diagnosis not present

## 2015-04-01 DIAGNOSIS — M545 Low back pain: Secondary | ICD-10-CM | POA: Diagnosis not present

## 2015-04-01 DIAGNOSIS — M25561 Pain in right knee: Secondary | ICD-10-CM | POA: Diagnosis not present

## 2015-04-01 DIAGNOSIS — M25562 Pain in left knee: Secondary | ICD-10-CM | POA: Diagnosis not present

## 2015-04-01 DIAGNOSIS — M5136 Other intervertebral disc degeneration, lumbar region: Secondary | ICD-10-CM | POA: Diagnosis not present

## 2015-04-01 DIAGNOSIS — M5441 Lumbago with sciatica, right side: Secondary | ICD-10-CM | POA: Diagnosis not present

## 2015-04-01 DIAGNOSIS — M79651 Pain in right thigh: Secondary | ICD-10-CM | POA: Diagnosis not present

## 2015-04-05 DIAGNOSIS — M79651 Pain in right thigh: Secondary | ICD-10-CM | POA: Diagnosis not present

## 2015-04-05 DIAGNOSIS — M5441 Lumbago with sciatica, right side: Secondary | ICD-10-CM | POA: Diagnosis not present

## 2015-04-05 DIAGNOSIS — M5136 Other intervertebral disc degeneration, lumbar region: Secondary | ICD-10-CM | POA: Diagnosis not present

## 2015-04-05 DIAGNOSIS — M25562 Pain in left knee: Secondary | ICD-10-CM | POA: Diagnosis not present

## 2015-04-05 DIAGNOSIS — M545 Low back pain: Secondary | ICD-10-CM | POA: Diagnosis not present

## 2015-04-05 DIAGNOSIS — M25561 Pain in right knee: Secondary | ICD-10-CM | POA: Diagnosis not present

## 2015-04-07 DIAGNOSIS — M5441 Lumbago with sciatica, right side: Secondary | ICD-10-CM | POA: Diagnosis not present

## 2015-04-07 DIAGNOSIS — M25561 Pain in right knee: Secondary | ICD-10-CM | POA: Diagnosis not present

## 2015-04-07 DIAGNOSIS — M25562 Pain in left knee: Secondary | ICD-10-CM | POA: Diagnosis not present

## 2015-04-07 DIAGNOSIS — M545 Low back pain: Secondary | ICD-10-CM | POA: Diagnosis not present

## 2015-04-07 DIAGNOSIS — M79651 Pain in right thigh: Secondary | ICD-10-CM | POA: Diagnosis not present

## 2015-04-07 DIAGNOSIS — M5136 Other intervertebral disc degeneration, lumbar region: Secondary | ICD-10-CM | POA: Diagnosis not present

## 2015-04-13 DIAGNOSIS — M25561 Pain in right knee: Secondary | ICD-10-CM | POA: Diagnosis not present

## 2015-04-13 DIAGNOSIS — M25562 Pain in left knee: Secondary | ICD-10-CM | POA: Diagnosis not present

## 2015-04-13 DIAGNOSIS — M5136 Other intervertebral disc degeneration, lumbar region: Secondary | ICD-10-CM | POA: Diagnosis not present

## 2015-04-13 DIAGNOSIS — M545 Low back pain: Secondary | ICD-10-CM | POA: Diagnosis not present

## 2015-04-13 DIAGNOSIS — M79651 Pain in right thigh: Secondary | ICD-10-CM | POA: Diagnosis not present

## 2015-04-13 DIAGNOSIS — M5441 Lumbago with sciatica, right side: Secondary | ICD-10-CM | POA: Diagnosis not present

## 2015-04-15 DIAGNOSIS — M5136 Other intervertebral disc degeneration, lumbar region: Secondary | ICD-10-CM | POA: Diagnosis not present

## 2015-04-15 DIAGNOSIS — M79651 Pain in right thigh: Secondary | ICD-10-CM | POA: Diagnosis not present

## 2015-04-15 DIAGNOSIS — M25562 Pain in left knee: Secondary | ICD-10-CM | POA: Diagnosis not present

## 2015-04-15 DIAGNOSIS — M545 Low back pain: Secondary | ICD-10-CM | POA: Diagnosis not present

## 2015-04-15 DIAGNOSIS — M5441 Lumbago with sciatica, right side: Secondary | ICD-10-CM | POA: Diagnosis not present

## 2015-04-15 DIAGNOSIS — M25561 Pain in right knee: Secondary | ICD-10-CM | POA: Diagnosis not present

## 2015-04-22 DIAGNOSIS — M25562 Pain in left knee: Secondary | ICD-10-CM | POA: Diagnosis not present

## 2015-04-22 DIAGNOSIS — M5136 Other intervertebral disc degeneration, lumbar region: Secondary | ICD-10-CM | POA: Diagnosis not present

## 2015-04-22 DIAGNOSIS — M25561 Pain in right knee: Secondary | ICD-10-CM | POA: Diagnosis not present

## 2015-04-22 DIAGNOSIS — M545 Low back pain: Secondary | ICD-10-CM | POA: Diagnosis not present

## 2015-04-22 DIAGNOSIS — M79651 Pain in right thigh: Secondary | ICD-10-CM | POA: Diagnosis not present

## 2015-04-22 DIAGNOSIS — M5441 Lumbago with sciatica, right side: Secondary | ICD-10-CM | POA: Diagnosis not present

## 2015-04-23 DIAGNOSIS — M25561 Pain in right knee: Secondary | ICD-10-CM | POA: Diagnosis not present

## 2015-04-23 DIAGNOSIS — M5136 Other intervertebral disc degeneration, lumbar region: Secondary | ICD-10-CM | POA: Diagnosis not present

## 2015-04-23 DIAGNOSIS — M25562 Pain in left knee: Secondary | ICD-10-CM | POA: Diagnosis not present

## 2015-04-23 DIAGNOSIS — M5441 Lumbago with sciatica, right side: Secondary | ICD-10-CM | POA: Diagnosis not present

## 2015-04-23 DIAGNOSIS — M545 Low back pain: Secondary | ICD-10-CM | POA: Diagnosis not present

## 2015-04-23 DIAGNOSIS — M79651 Pain in right thigh: Secondary | ICD-10-CM | POA: Diagnosis not present

## 2015-05-14 IMAGING — CR DG CHEST 2V
2 series · 2 of 2 positions shown · non-contrast
Comparison: 03/18/2013.

CLINICAL DATA: Cough.

EXAM:
CHEST  2 VIEW

[x chest ap]
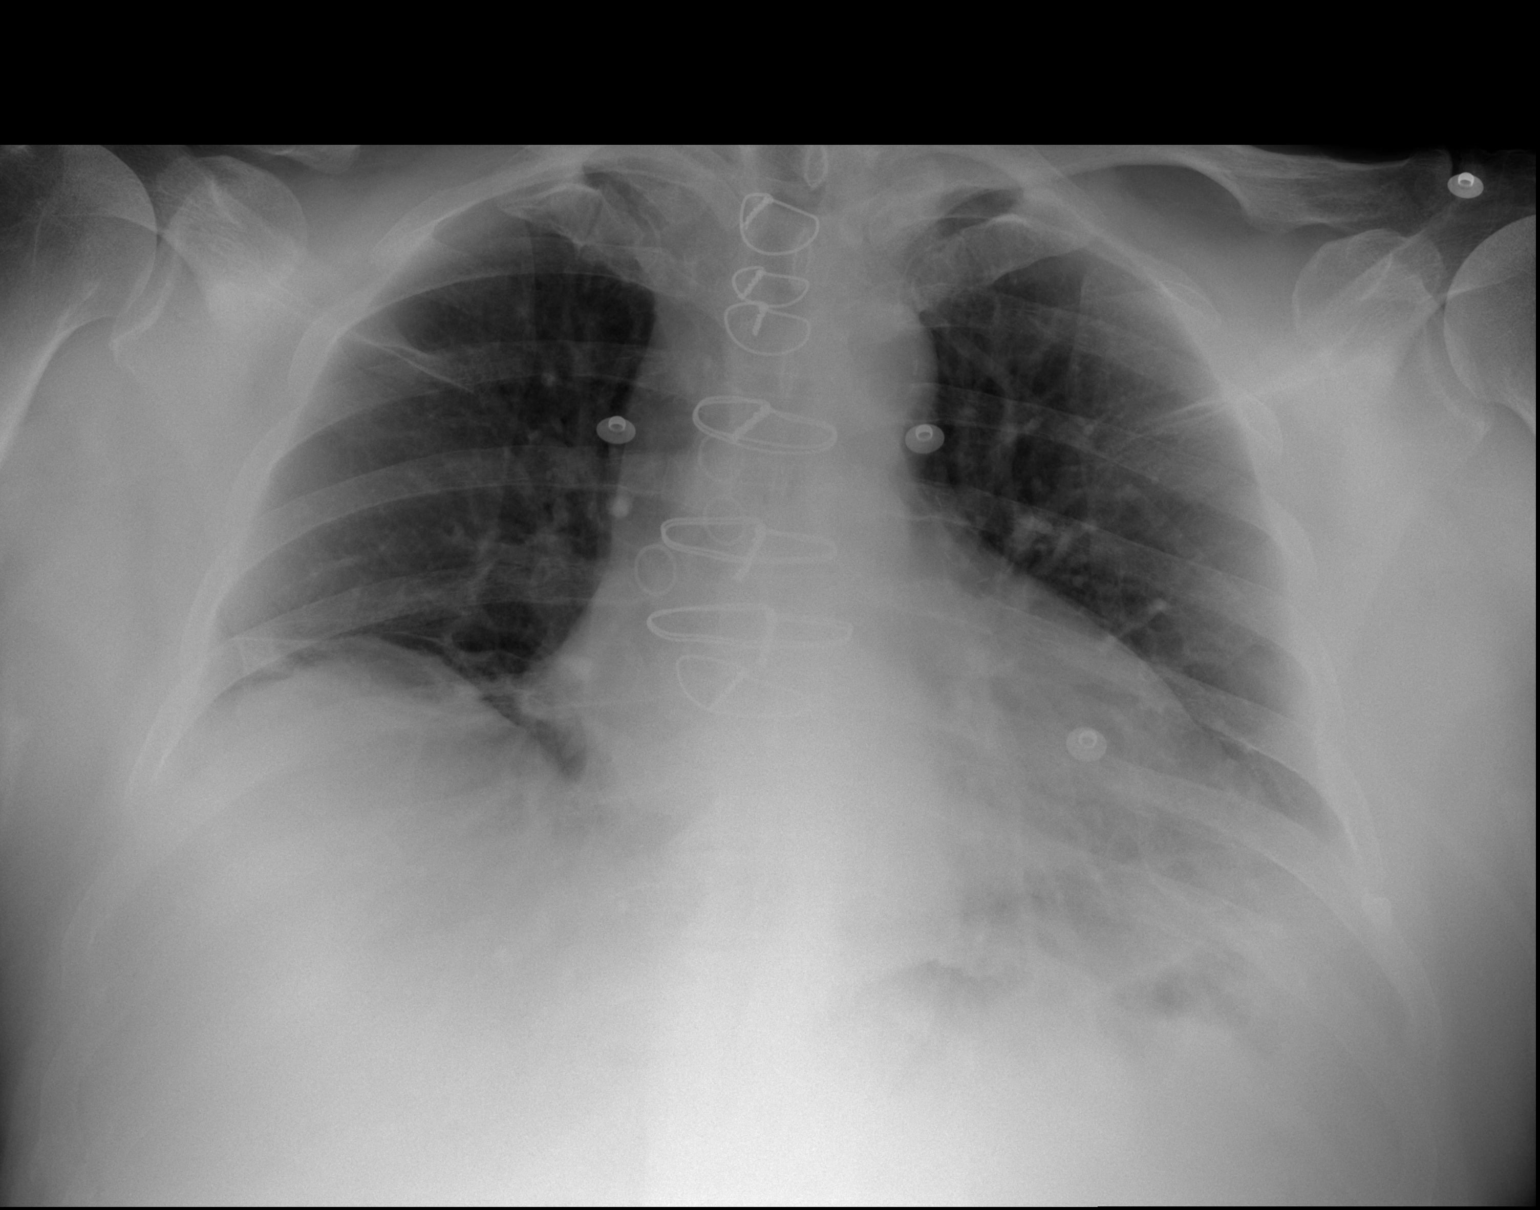

[w chest lat]
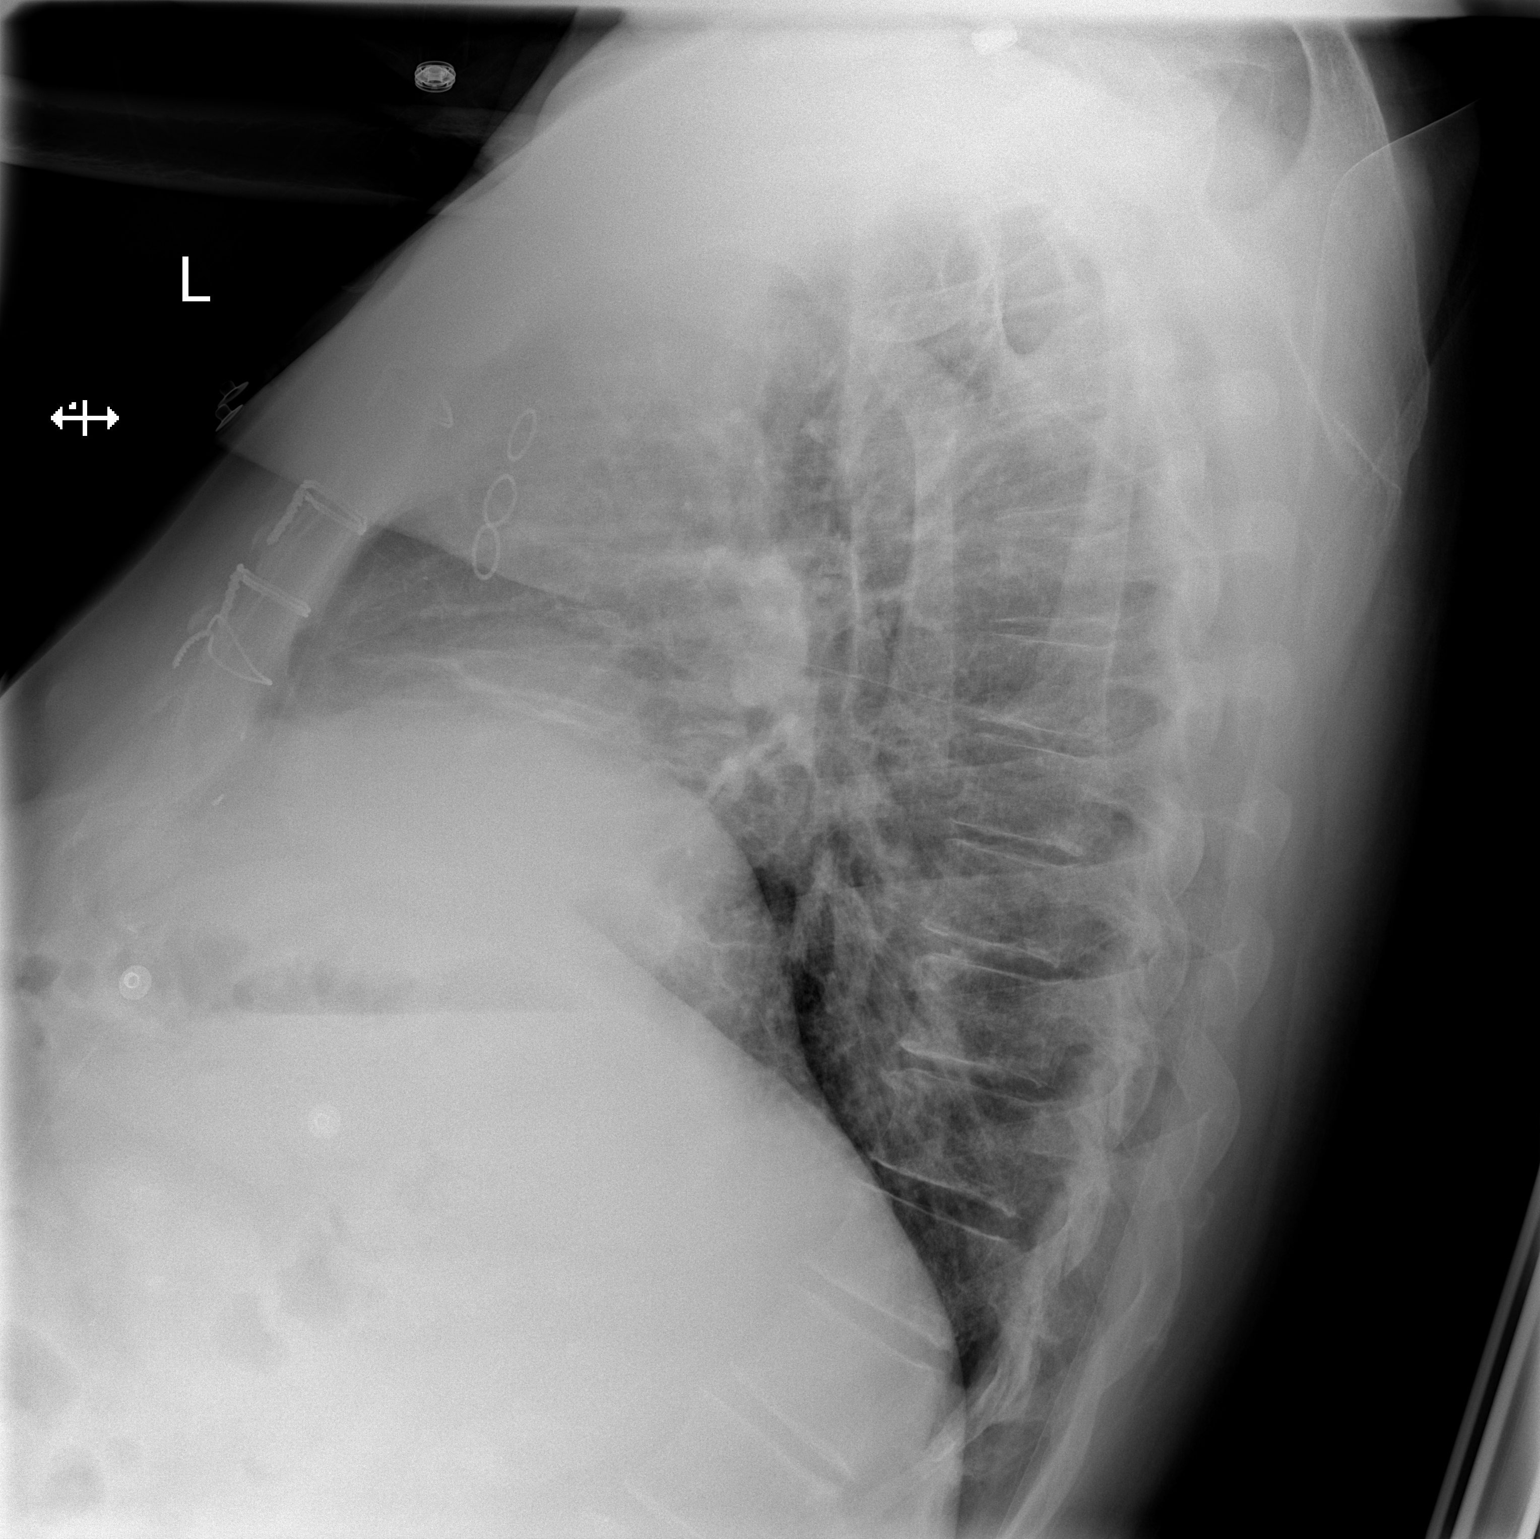

[2 of 2 positions shown; findings below may reference images not displayed]

FINDINGS: Low lung volumes. Mild right lung subsegmental atelectatic change
with slight scarring persists. Cardiomegaly status post CABG. No
active infiltrates or failure. Slight blunting right CP angle.
Negative osseous structures.
IMPRESSION: Cardiac enlargement without focal infiltrates or CHF. Low lung
volumes.

## 2015-05-14 IMAGING — CT CT HEAD W/O CM
1 series · 16 of 30 positions shown, 20 images · non-contrast
Comparison: 08/19/2011 MR and 05/29/2009 MR.

CLINICAL DATA: Multiple syncopal episodes in the past few days.
History of hypertension.

EXAM:
CT HEAD WITHOUT CONTRAST
TECHNIQUE: Contiguous axial images were obtained from the base of the skull
through the vertex without contrast.

[Series 2: head 5.0 h30s · axial · 0.44mm/px · z∈[-63,+82]mm · 16 of 33 slices shown, 20 images]
[im 2/33  brain]
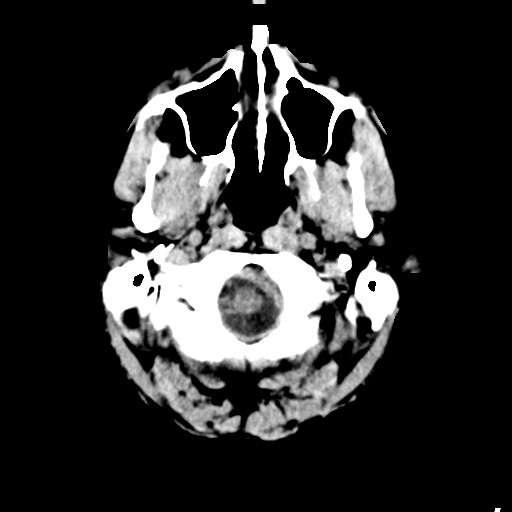
[im 2/33  bone]
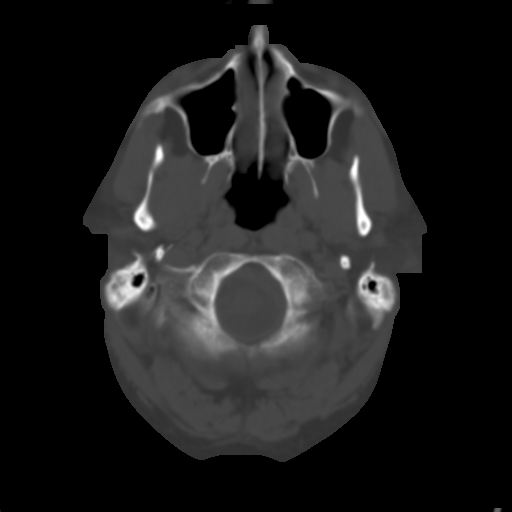
[im 4/33  brain]
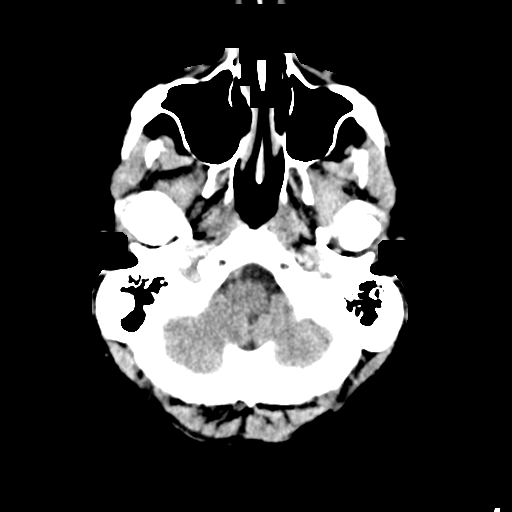
[im 6/33  brain]
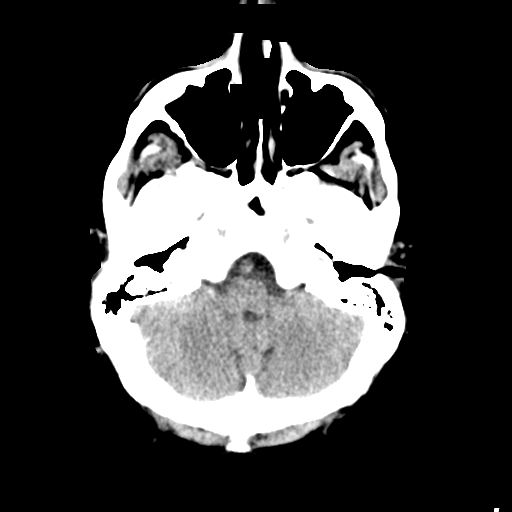
[im 8/33  brain]
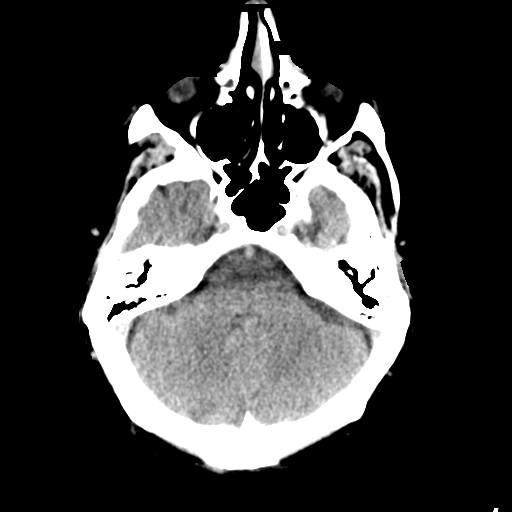
[im 9/33  brain]
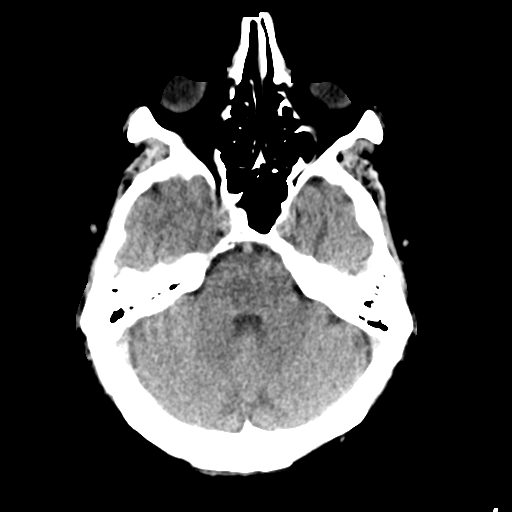
[im 9/33  bone]
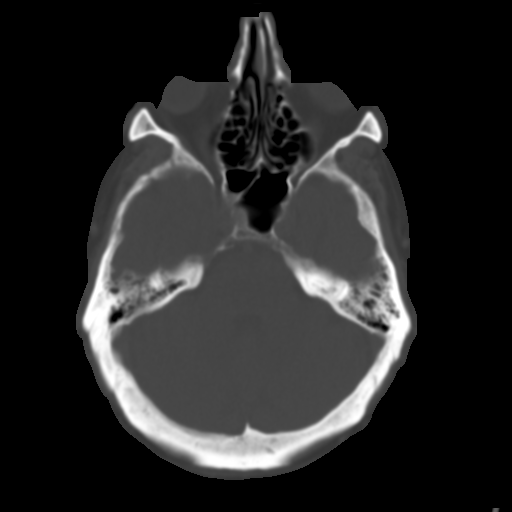
[im 12/33  brain]
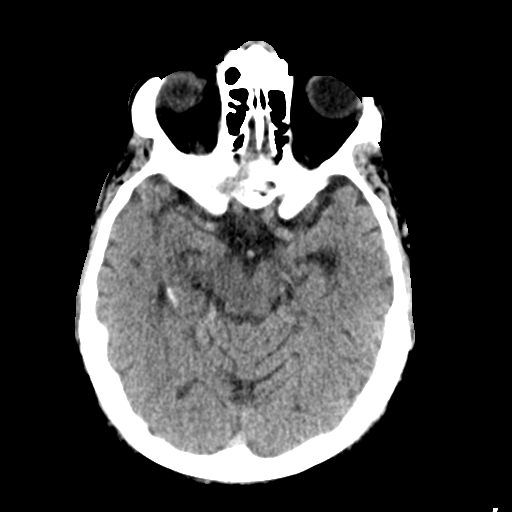
[im 14/33  brain]
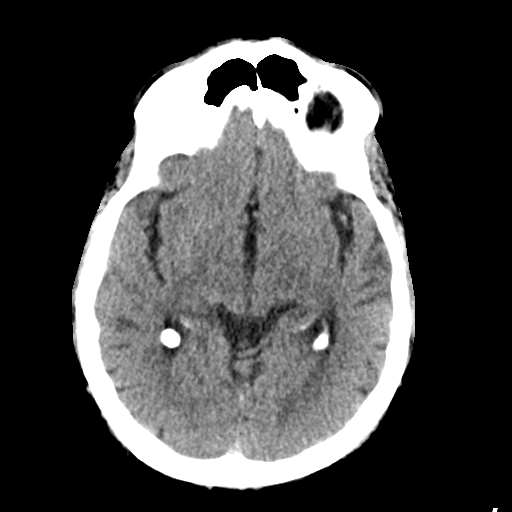
[im 16/33  brain]
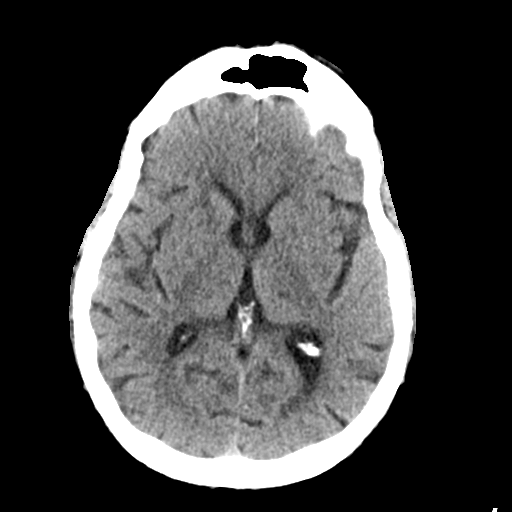
[im 17/33  brain]
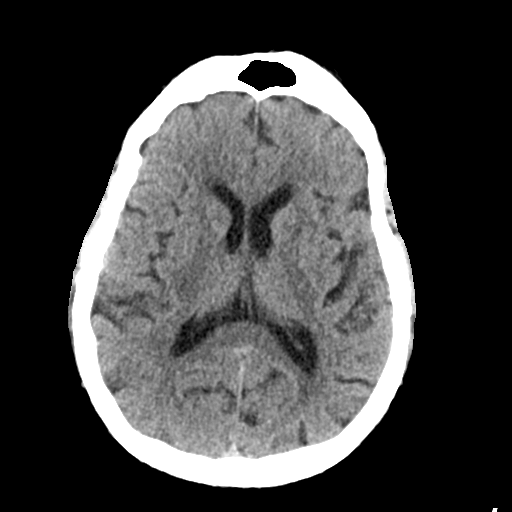
[im 17/33  bone]
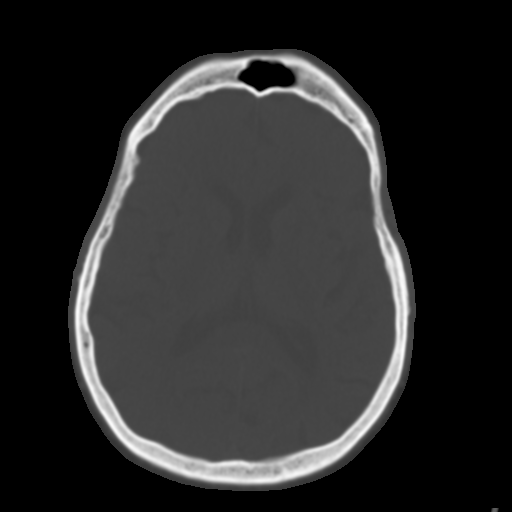
[im 19/33  brain]
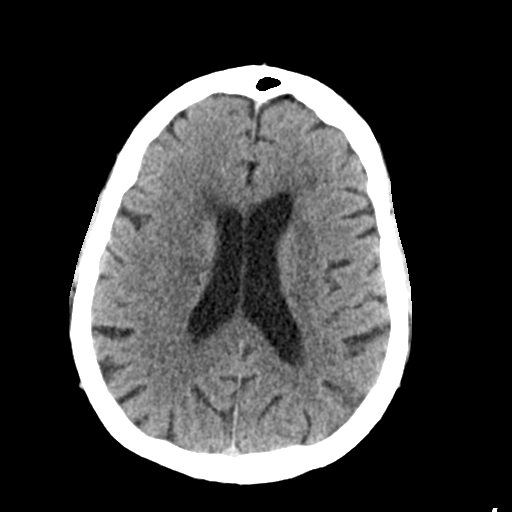
[im 21/33  brain]
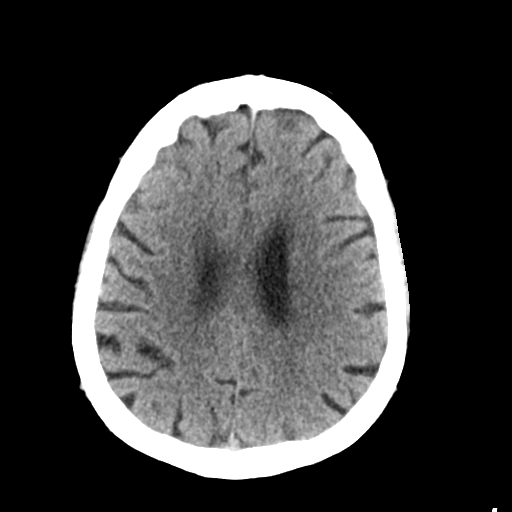
[im 24/33  brain]
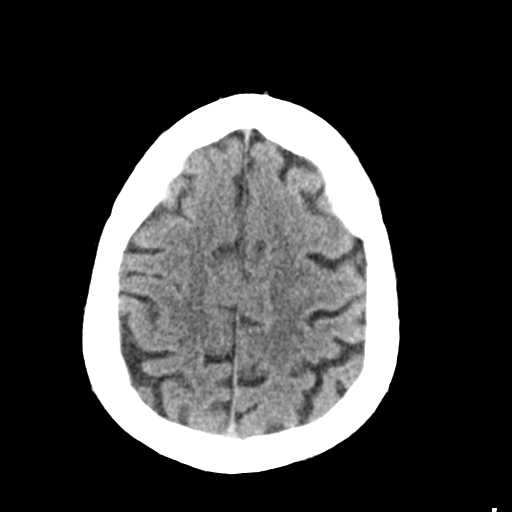
[im 25/33  brain]
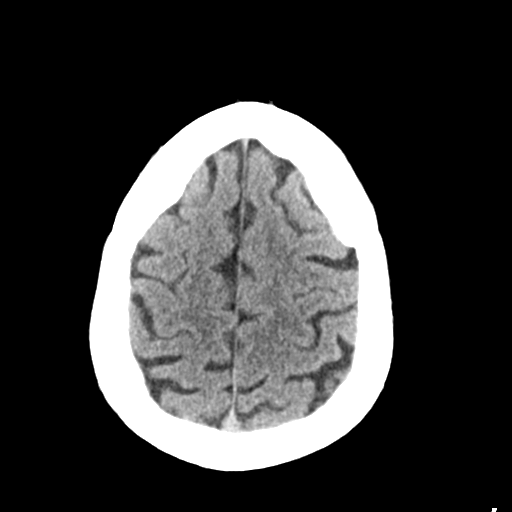
[im 25/33  bone]
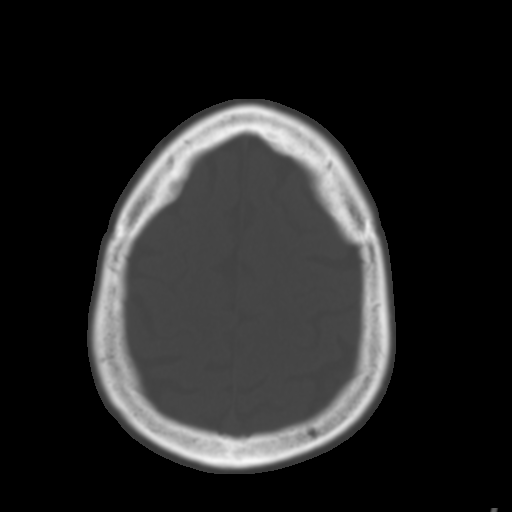
[im 27/33  brain]
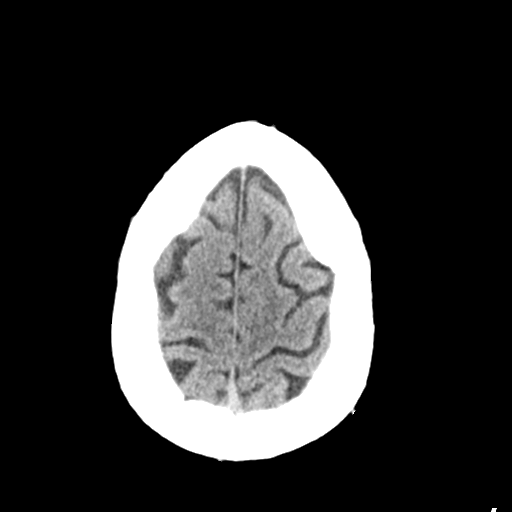
[im 29/33  brain]
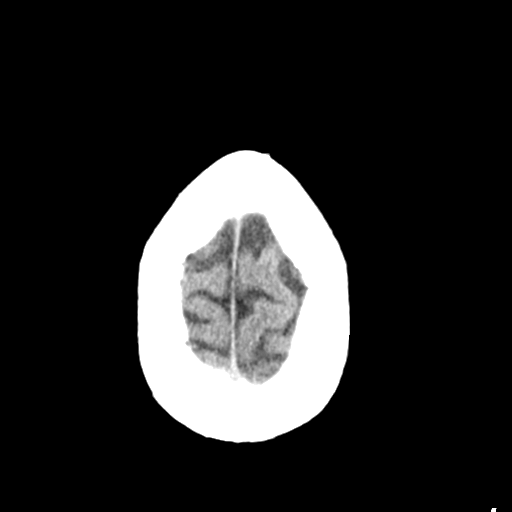
[im 31/33  brain]
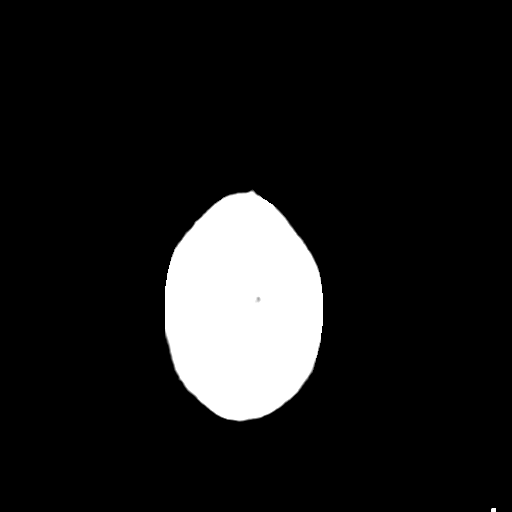

[16 of 30 positions shown; findings below may reference images not displayed]

FINDINGS: Mild cerebral and cerebellar atrophy. Chronic microvascular ischemic
change affects the periventricular and subcortical white matter.
Remote lacunar infarcts affect the bilateral basal ganglia. No
evidence for acute infarction, hemorrhage, mass lesion,
hydrocephalus, or extra-axial fluid. Vascular calcification. No CT
signs of proximal vascular thrombosis. Left vertebral dominant.
Clear sinuses and mastoids.

Compared with prior studies, a similar appearance is noted.
IMPRESSION: No acute intracranial abnormality.  Chronic changes as described.

## 2015-05-14 IMAGING — CR DG ANKLE COMPLETE 3+V*R*
3 series · 3 of 3 positions shown · non-contrast
Comparison: None.

CLINICAL DATA: History of trauma from a fall. Right-sided ankle
pain.

EXAM:
RIGHT ANKLE - COMPLETE 3+ VIEW

[x ankle ap right]
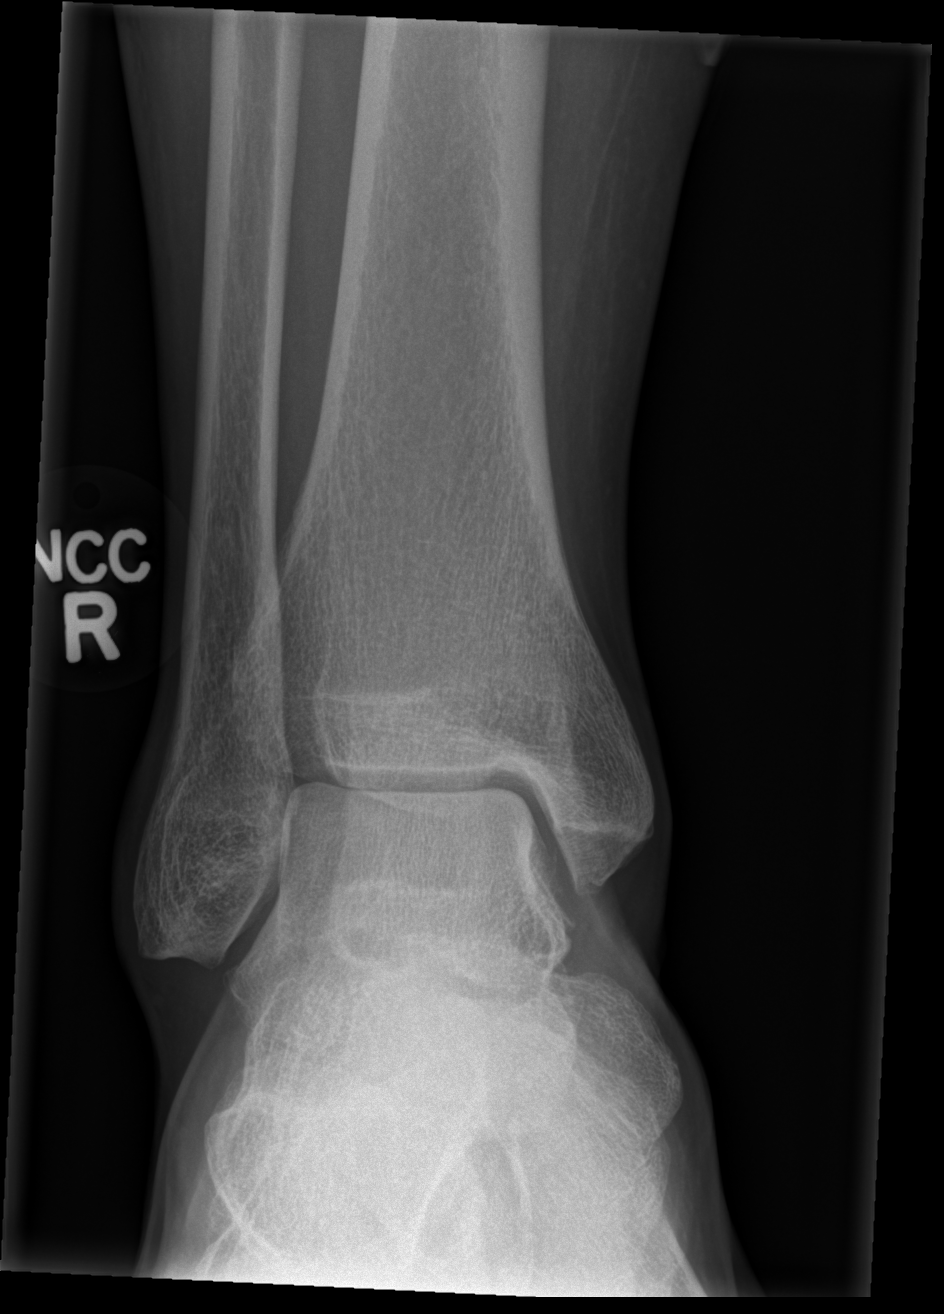

[x ankle obl right]
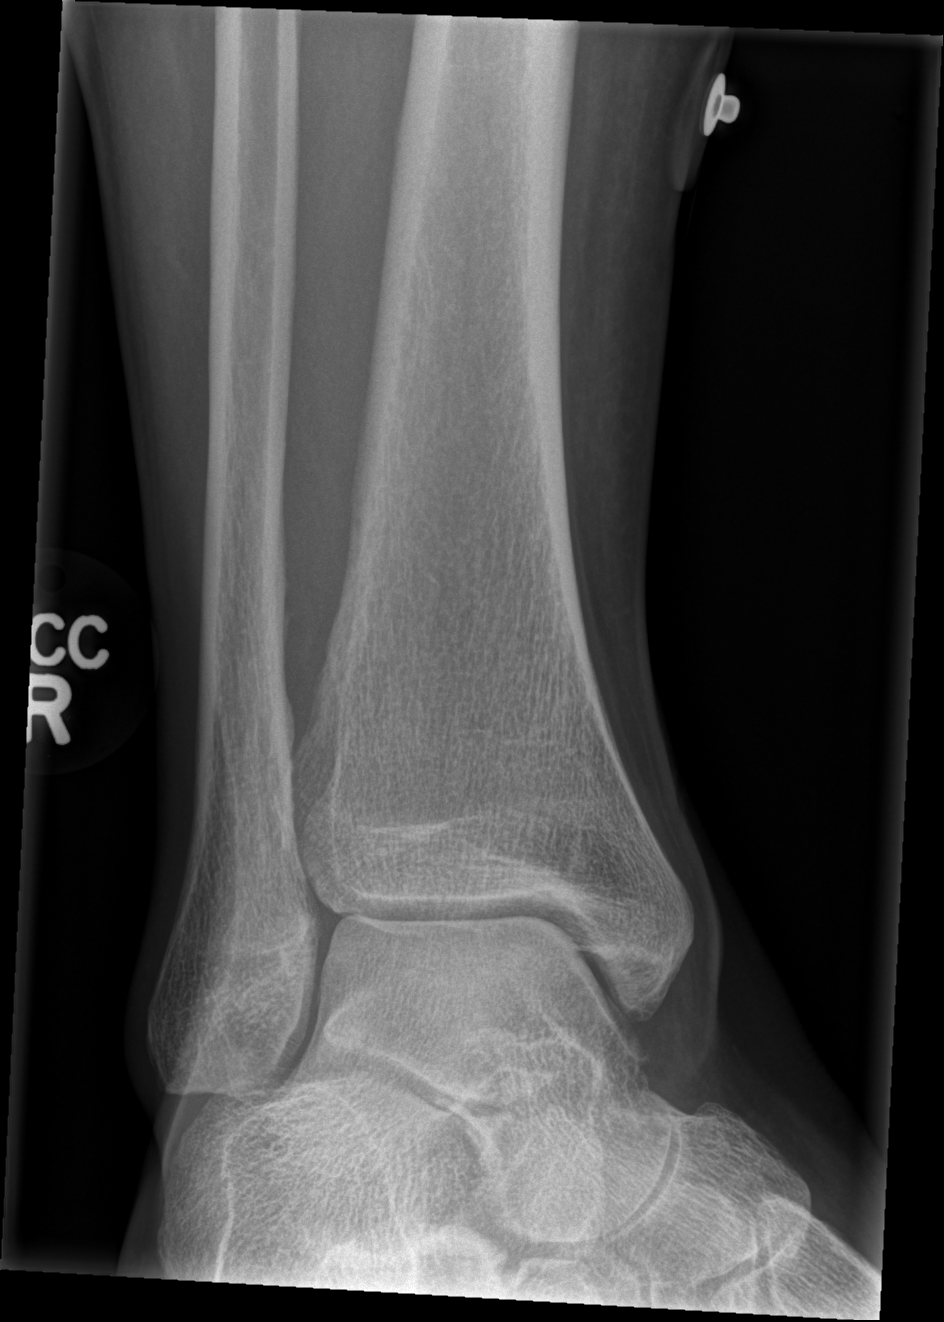

[x ankle lat right]
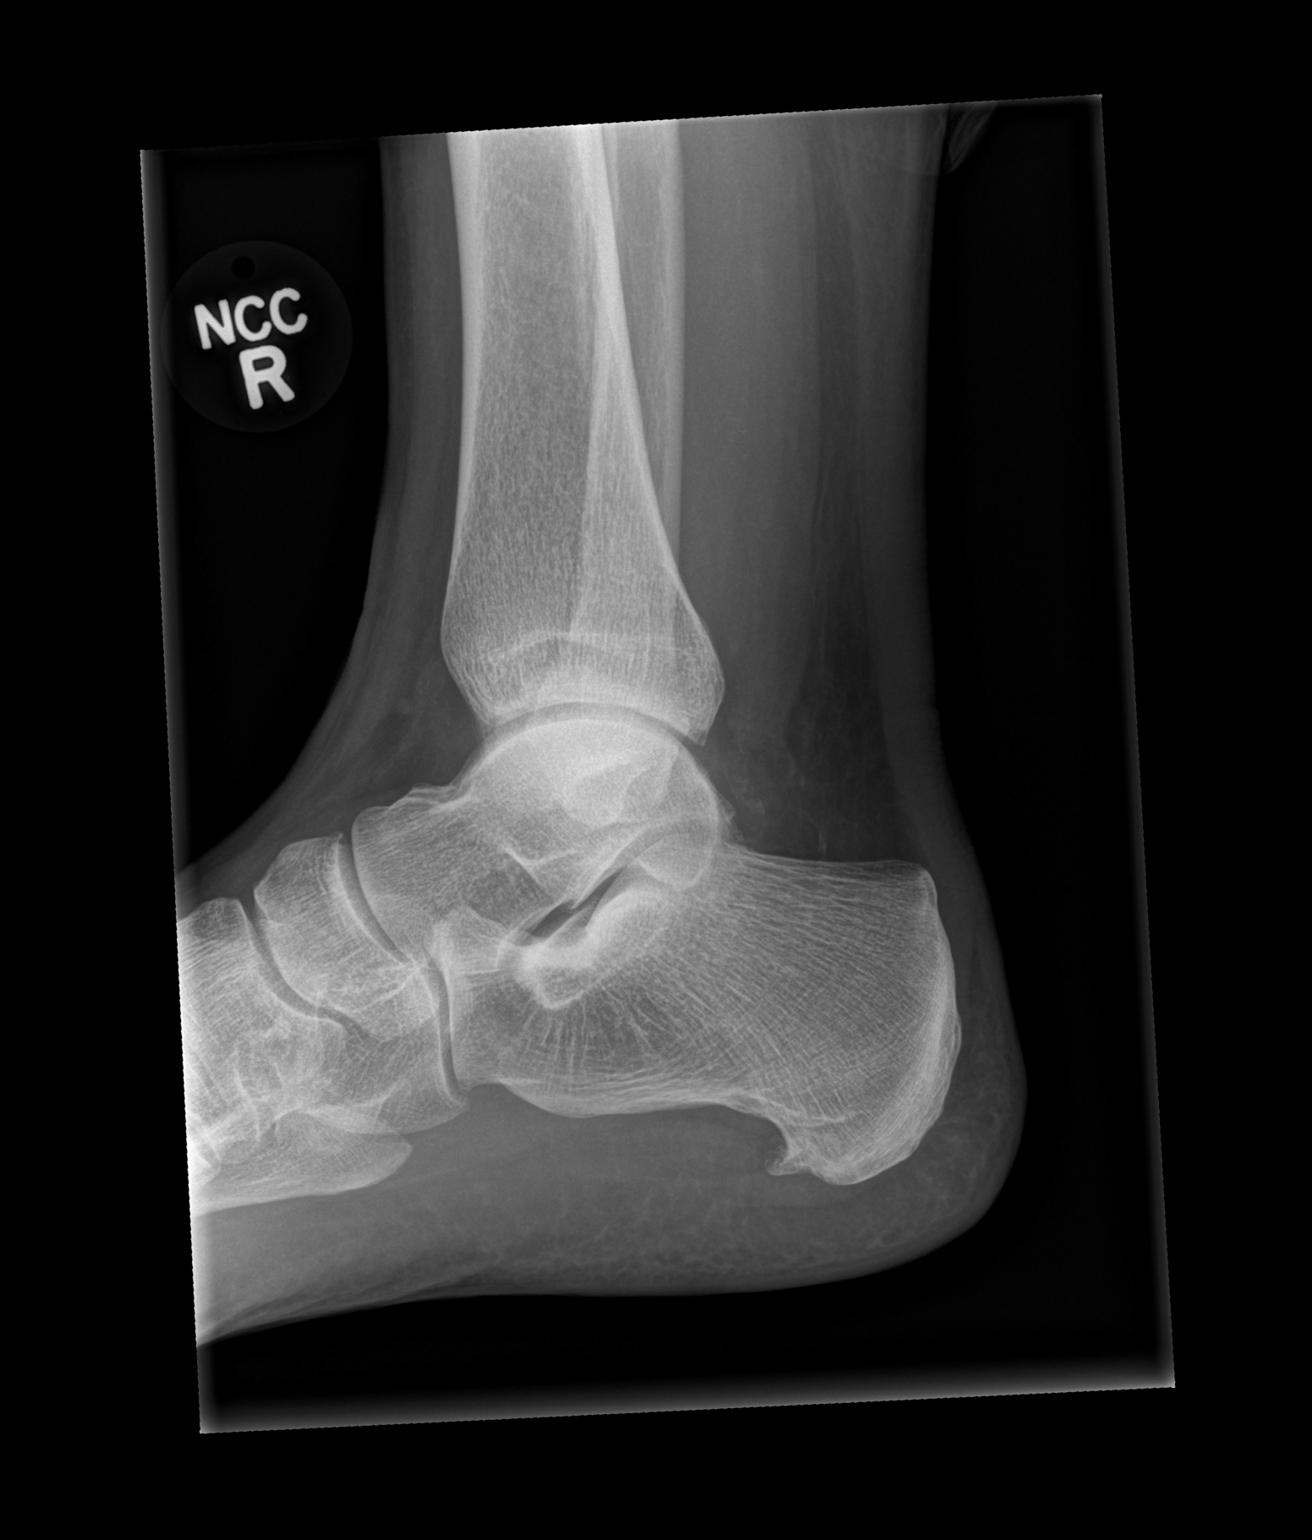

[3 of 3 positions shown; findings below may reference images not displayed]

FINDINGS: There is no evidence of fracture, dislocation, or joint effusion.
There is no evidence of arthropathy or other focal bone abnormality.
Soft tissues are unremarkable. Small plantar calcaneal spur
incidentally noted.
IMPRESSION: Negative.

## 2015-05-26 ENCOUNTER — Encounter: Payer: Medicare Other | Admitting: *Deleted

## 2015-05-28 ENCOUNTER — Encounter: Payer: Self-pay | Admitting: Cardiology

## 2015-06-24 ENCOUNTER — Ambulatory Visit: Payer: Medicare Other | Admitting: Podiatry

## 2015-06-28 DIAGNOSIS — M545 Low back pain: Secondary | ICD-10-CM | POA: Diagnosis not present

## 2015-06-28 DIAGNOSIS — I2510 Atherosclerotic heart disease of native coronary artery without angina pectoris: Secondary | ICD-10-CM | POA: Diagnosis not present

## 2015-06-28 DIAGNOSIS — I10 Essential (primary) hypertension: Secondary | ICD-10-CM | POA: Diagnosis not present

## 2015-06-28 DIAGNOSIS — K219 Gastro-esophageal reflux disease without esophagitis: Secondary | ICD-10-CM | POA: Diagnosis not present

## 2015-06-28 DIAGNOSIS — F329 Major depressive disorder, single episode, unspecified: Secondary | ICD-10-CM | POA: Diagnosis not present

## 2015-06-28 DIAGNOSIS — G309 Alzheimer's disease, unspecified: Secondary | ICD-10-CM | POA: Diagnosis not present

## 2015-07-01 ENCOUNTER — Encounter: Payer: Self-pay | Admitting: Podiatry

## 2015-07-01 ENCOUNTER — Ambulatory Visit (INDEPENDENT_AMBULATORY_CARE_PROVIDER_SITE_OTHER): Payer: Medicare Other | Admitting: Podiatry

## 2015-07-01 DIAGNOSIS — L84 Corns and callosities: Secondary | ICD-10-CM | POA: Diagnosis not present

## 2015-07-01 DIAGNOSIS — M79676 Pain in unspecified toe(s): Secondary | ICD-10-CM

## 2015-07-01 DIAGNOSIS — M79675 Pain in left toe(s): Secondary | ICD-10-CM

## 2015-07-01 DIAGNOSIS — B351 Tinea unguium: Secondary | ICD-10-CM | POA: Diagnosis not present

## 2015-07-01 NOTE — Progress Notes (Signed)
Patient ID: [name], male   DOB: 04/21/1945, 69 y.o.   MRN: [id number] Complaint:  Visit Type: Patient returns to my office for continued evaluation of his feet.  He has alzheimers and says there is pain due to to his toes.. Complaint: Patient states" my nails have grown long and thick and become painful to walk and wear shoes" . The patient presents for preventative foot care services.  Vascular: dorsalis pedis and posterior tibial pulses are palpable bilateral. Capillary return is immediate. Temperature gradient is WNL. Skin turgor WNL  Sensorium: Normal Semmes Weinstein monofilament test. Normal tactile sensation bilaterally. Nail Exam: Pt has thick disfigured discolored nails with subungual debris noted bilateral entire nail hallux  Ulcer Exam: There is no evidence of ulcer or pre-ulcerative changes or infection. Orthopedic Exam: Muscle tone and strength are WNL. No limitations in general ROM. No crepitus or effusions noted. Foot type and digits show no abnormalities. Bony prominences are unremarkable. Asymptomatic HAV. Skin: No Porokeratosis. No infection or ulcers.  Pinch callus right hallux.  Diagnosis:  Onychomycosis, , Pain in right toe, pain in left toes  Treatment & Plan Procedures and Treatment: Consent by patient was obtained for treatment procedures. The patient understood the discussion of treatment and procedures well. All questions were answered thoroughly reviewed. Debridement of mycotic and hypertrophic toenails, 1 through 5 bilateral and clearing of subungual debris. No ulceration, no infection noted.  Return Visit-Office Procedure: Patient instructed to return to the office for a follow up visit 3 months for continued evaluation and treatment.  Kylia Grajales DPM 

## 2015-07-16 DIAGNOSIS — M5116 Intervertebral disc disorders with radiculopathy, lumbar region: Secondary | ICD-10-CM | POA: Diagnosis not present

## 2015-07-16 DIAGNOSIS — M5136 Other intervertebral disc degeneration, lumbar region: Secondary | ICD-10-CM | POA: Diagnosis not present

## 2015-07-20 ENCOUNTER — Encounter (HOSPITAL_BASED_OUTPATIENT_CLINIC_OR_DEPARTMENT_OTHER): Payer: Self-pay

## 2015-07-20 ENCOUNTER — Other Ambulatory Visit (HOSPITAL_BASED_OUTPATIENT_CLINIC_OR_DEPARTMENT_OTHER): Payer: Self-pay | Admitting: Family Medicine

## 2015-07-20 ENCOUNTER — Ambulatory Visit (HOSPITAL_BASED_OUTPATIENT_CLINIC_OR_DEPARTMENT_OTHER)
Admission: RE | Admit: 2015-07-20 | Discharge: 2015-07-20 | Disposition: A | Discharge status: Home / Self Care | Payer: Medicare Other | Source: Ambulatory Visit | Attending: Family Medicine | Admitting: Family Medicine

## 2015-07-20 DIAGNOSIS — K5792 Diverticulitis of intestine, part unspecified, without perforation or abscess without bleeding: Secondary | ICD-10-CM

## 2015-07-20 DIAGNOSIS — G309 Alzheimer's disease, unspecified: Secondary | ICD-10-CM | POA: Diagnosis not present

## 2015-07-20 DIAGNOSIS — I700 Atherosclerosis of aorta: Secondary | ICD-10-CM | POA: Insufficient documentation

## 2015-07-20 DIAGNOSIS — R1084 Generalized abdominal pain: Secondary | ICD-10-CM

## 2015-07-20 DIAGNOSIS — R109 Unspecified abdominal pain: Secondary | ICD-10-CM | POA: Insufficient documentation

## 2015-07-20 DIAGNOSIS — I714 Abdominal aortic aneurysm, without rupture: Secondary | ICD-10-CM | POA: Insufficient documentation

## 2015-07-20 DIAGNOSIS — D649 Anemia, unspecified: Secondary | ICD-10-CM | POA: Diagnosis not present

## 2015-07-20 MED ORDER — IOPAMIDOL (ISOVUE-300) INJECTION 61%
100.0000 mL | Freq: Once | INTRAVENOUS | Status: AC | PRN
Start: 2015-07-20 — End: 2015-07-20
  Administered 2015-07-20: 100 mL via INTRAVENOUS

## 2015-07-22 DIAGNOSIS — I714 Abdominal aortic aneurysm, without rupture: Secondary | ICD-10-CM | POA: Diagnosis not present

## 2015-07-22 DIAGNOSIS — H01001 Unspecified blepharitis right upper eyelid: Secondary | ICD-10-CM | POA: Diagnosis not present

## 2015-07-22 DIAGNOSIS — H01004 Unspecified blepharitis left upper eyelid: Secondary | ICD-10-CM | POA: Diagnosis not present

## 2015-07-22 DIAGNOSIS — H2513 Age-related nuclear cataract, bilateral: Secondary | ICD-10-CM | POA: Diagnosis not present

## 2015-07-22 DIAGNOSIS — H01002 Unspecified blepharitis right lower eyelid: Secondary | ICD-10-CM | POA: Diagnosis not present

## 2015-07-22 DIAGNOSIS — R109 Unspecified abdominal pain: Secondary | ICD-10-CM | POA: Diagnosis not present

## 2015-07-22 DIAGNOSIS — H01005 Unspecified blepharitis left lower eyelid: Secondary | ICD-10-CM | POA: Diagnosis not present

## 2015-07-30 DIAGNOSIS — G301 Alzheimer's disease with late onset: Secondary | ICD-10-CM | POA: Diagnosis not present

## 2015-07-30 DIAGNOSIS — G44219 Episodic tension-type headache, not intractable: Secondary | ICD-10-CM | POA: Diagnosis not present

## 2015-08-10 DIAGNOSIS — M5441 Lumbago with sciatica, right side: Secondary | ICD-10-CM | POA: Diagnosis not present

## 2015-08-10 DIAGNOSIS — M5136 Other intervertebral disc degeneration, lumbar region: Secondary | ICD-10-CM | POA: Diagnosis not present

## 2015-08-10 DIAGNOSIS — G8929 Other chronic pain: Secondary | ICD-10-CM | POA: Diagnosis not present

## 2015-08-11 DIAGNOSIS — R41841 Cognitive communication deficit: Secondary | ICD-10-CM | POA: Diagnosis not present

## 2015-08-11 DIAGNOSIS — R488 Other symbolic dysfunctions: Secondary | ICD-10-CM | POA: Diagnosis not present

## 2015-08-12 DIAGNOSIS — R41841 Cognitive communication deficit: Secondary | ICD-10-CM | POA: Diagnosis not present

## 2015-08-12 DIAGNOSIS — R488 Other symbolic dysfunctions: Secondary | ICD-10-CM | POA: Diagnosis not present

## 2015-08-13 DIAGNOSIS — R41841 Cognitive communication deficit: Secondary | ICD-10-CM | POA: Diagnosis not present

## 2015-08-13 DIAGNOSIS — R488 Other symbolic dysfunctions: Secondary | ICD-10-CM | POA: Diagnosis not present

## 2015-08-17 DIAGNOSIS — R41841 Cognitive communication deficit: Secondary | ICD-10-CM | POA: Diagnosis not present

## 2015-08-17 DIAGNOSIS — R488 Other symbolic dysfunctions: Secondary | ICD-10-CM | POA: Diagnosis not present

## 2015-08-19 DIAGNOSIS — R41841 Cognitive communication deficit: Secondary | ICD-10-CM | POA: Diagnosis not present

## 2015-08-19 DIAGNOSIS — R488 Other symbolic dysfunctions: Secondary | ICD-10-CM | POA: Diagnosis not present

## 2015-08-20 DIAGNOSIS — R488 Other symbolic dysfunctions: Secondary | ICD-10-CM | POA: Diagnosis not present

## 2015-08-20 DIAGNOSIS — R41841 Cognitive communication deficit: Secondary | ICD-10-CM | POA: Diagnosis not present

## 2015-08-23 DIAGNOSIS — R488 Other symbolic dysfunctions: Secondary | ICD-10-CM | POA: Diagnosis not present

## 2015-08-23 DIAGNOSIS — R41841 Cognitive communication deficit: Secondary | ICD-10-CM | POA: Diagnosis not present

## 2015-08-24 DIAGNOSIS — R488 Other symbolic dysfunctions: Secondary | ICD-10-CM | POA: Diagnosis not present

## 2015-08-24 DIAGNOSIS — R41841 Cognitive communication deficit: Secondary | ICD-10-CM | POA: Diagnosis not present

## 2015-08-25 DIAGNOSIS — R41841 Cognitive communication deficit: Secondary | ICD-10-CM | POA: Diagnosis not present

## 2015-08-25 DIAGNOSIS — R488 Other symbolic dysfunctions: Secondary | ICD-10-CM | POA: Diagnosis not present

## 2015-08-27 DIAGNOSIS — M5416 Radiculopathy, lumbar region: Secondary | ICD-10-CM | POA: Diagnosis not present

## 2015-08-30 DIAGNOSIS — R488 Other symbolic dysfunctions: Secondary | ICD-10-CM | POA: Diagnosis not present

## 2015-08-30 DIAGNOSIS — R41841 Cognitive communication deficit: Secondary | ICD-10-CM | POA: Diagnosis not present

## 2015-08-31 DIAGNOSIS — R41841 Cognitive communication deficit: Secondary | ICD-10-CM | POA: Diagnosis not present

## 2015-08-31 DIAGNOSIS — R488 Other symbolic dysfunctions: Secondary | ICD-10-CM | POA: Diagnosis not present

## 2015-09-01 DIAGNOSIS — R488 Other symbolic dysfunctions: Secondary | ICD-10-CM | POA: Diagnosis not present

## 2015-09-01 DIAGNOSIS — R41841 Cognitive communication deficit: Secondary | ICD-10-CM | POA: Diagnosis not present

## 2015-09-02 DIAGNOSIS — R488 Other symbolic dysfunctions: Secondary | ICD-10-CM | POA: Diagnosis not present

## 2015-09-02 DIAGNOSIS — R41841 Cognitive communication deficit: Secondary | ICD-10-CM | POA: Diagnosis not present

## 2015-09-06 DIAGNOSIS — R488 Other symbolic dysfunctions: Secondary | ICD-10-CM | POA: Diagnosis not present

## 2015-09-06 DIAGNOSIS — R41841 Cognitive communication deficit: Secondary | ICD-10-CM | POA: Diagnosis not present

## 2015-09-08 DIAGNOSIS — R488 Other symbolic dysfunctions: Secondary | ICD-10-CM | POA: Diagnosis not present

## 2015-09-08 DIAGNOSIS — R41841 Cognitive communication deficit: Secondary | ICD-10-CM | POA: Diagnosis not present

## 2015-09-10 DIAGNOSIS — R488 Other symbolic dysfunctions: Secondary | ICD-10-CM | POA: Diagnosis not present

## 2015-09-10 DIAGNOSIS — R41841 Cognitive communication deficit: Secondary | ICD-10-CM | POA: Diagnosis not present

## 2015-09-13 DIAGNOSIS — R41841 Cognitive communication deficit: Secondary | ICD-10-CM | POA: Diagnosis not present

## 2015-09-13 DIAGNOSIS — R488 Other symbolic dysfunctions: Secondary | ICD-10-CM | POA: Diagnosis not present

## 2015-09-15 DIAGNOSIS — R41841 Cognitive communication deficit: Secondary | ICD-10-CM | POA: Diagnosis not present

## 2015-09-15 DIAGNOSIS — R488 Other symbolic dysfunctions: Secondary | ICD-10-CM | POA: Diagnosis not present

## 2015-09-16 DIAGNOSIS — R488 Other symbolic dysfunctions: Secondary | ICD-10-CM | POA: Diagnosis not present

## 2015-09-16 DIAGNOSIS — R41841 Cognitive communication deficit: Secondary | ICD-10-CM | POA: Diagnosis not present

## 2015-09-17 DIAGNOSIS — M5136 Other intervertebral disc degeneration, lumbar region: Secondary | ICD-10-CM | POA: Diagnosis not present

## 2015-09-17 DIAGNOSIS — M5441 Lumbago with sciatica, right side: Secondary | ICD-10-CM | POA: Diagnosis not present

## 2015-09-17 DIAGNOSIS — M47816 Spondylosis without myelopathy or radiculopathy, lumbar region: Secondary | ICD-10-CM | POA: Diagnosis not present

## 2015-09-21 DIAGNOSIS — R488 Other symbolic dysfunctions: Secondary | ICD-10-CM | POA: Diagnosis not present

## 2015-09-21 DIAGNOSIS — R41841 Cognitive communication deficit: Secondary | ICD-10-CM | POA: Diagnosis not present

## 2015-09-22 DIAGNOSIS — R41841 Cognitive communication deficit: Secondary | ICD-10-CM | POA: Diagnosis not present

## 2015-09-22 DIAGNOSIS — R488 Other symbolic dysfunctions: Secondary | ICD-10-CM | POA: Diagnosis not present

## 2015-09-24 DIAGNOSIS — R41841 Cognitive communication deficit: Secondary | ICD-10-CM | POA: Diagnosis not present

## 2015-09-30 ENCOUNTER — Ambulatory Visit (INDEPENDENT_AMBULATORY_CARE_PROVIDER_SITE_OTHER): Payer: Medicare Other | Admitting: Podiatry

## 2015-09-30 ENCOUNTER — Encounter: Payer: Self-pay | Admitting: Podiatry

## 2015-09-30 DIAGNOSIS — M79676 Pain in unspecified toe(s): Secondary | ICD-10-CM

## 2015-09-30 DIAGNOSIS — B351 Tinea unguium: Secondary | ICD-10-CM | POA: Diagnosis not present

## 2015-09-30 DIAGNOSIS — M79675 Pain in left toe(s): Secondary | ICD-10-CM

## 2015-09-30 NOTE — Progress Notes (Signed)
Patient ID: [name], male   DOB: 10/10/1945, 69 y.o.   MRN: [id number] Complaint:  Visit Type: Patient returns to my office for continued evaluation of his feet.  He has alzheimers and says there is pain due to to his toes.. Complaint: Patient states" my nails have grown long and thick and become painful to walk and wear shoes" . The patient presents for preventative foot care services.  Vascular: dorsalis pedis and posterior tibial pulses are palpable bilateral. Capillary return is immediate. Temperature gradient is WNL. Skin turgor WNL  Sensorium: Normal Semmes Weinstein monofilament test. Normal tactile sensation bilaterally. Nail Exam: Pt has thick disfigured discolored nails with subungual debris noted bilateral entire nail hallux  Ulcer Exam: There is no evidence of ulcer or pre-ulcerative changes or infection. Orthopedic Exam: Muscle tone and strength are WNL. No limitations in general ROM. No crepitus or effusions noted. Foot type and digits show no abnormalities. Bony prominences are unremarkable. Asymptomatic HAV. Skin: No Porokeratosis. No infection or ulcers.  Pinch callus right hallux.  Diagnosis:  Onychomycosis, , Pain in right toe, pain in left toes  Treatment & Plan Procedures and Treatment: Consent by patient was obtained for treatment procedures. The patient understood the discussion of treatment and procedures well. All questions were answered thoroughly reviewed. Debridement of mycotic and hypertrophic toenails, 1 through 5 bilateral and clearing of subungual debris. No ulceration, no infection noted.  Return Visit-Office Procedure: Patient instructed to return to the office for a follow up visit 3 months for continued evaluation and treatment.  Jennalyn Cawley DPM 

## 2015-10-01 DIAGNOSIS — M47816 Spondylosis without myelopathy or radiculopathy, lumbar region: Secondary | ICD-10-CM | POA: Diagnosis not present

## 2015-10-01 DIAGNOSIS — M5136 Other intervertebral disc degeneration, lumbar region: Secondary | ICD-10-CM | POA: Diagnosis not present

## 2015-10-05 DIAGNOSIS — Z23 Encounter for immunization: Secondary | ICD-10-CM | POA: Diagnosis not present

## 2015-11-30 DIAGNOSIS — G44219 Episodic tension-type headache, not intractable: Secondary | ICD-10-CM | POA: Diagnosis not present

## 2015-11-30 DIAGNOSIS — G301 Alzheimer's disease with late onset: Secondary | ICD-10-CM | POA: Diagnosis not present

## 2015-12-29 NOTE — Progress Notes (Signed)
Electrophysiology Office Note Date: 12/31/2015  ID:  [name] J Poole, DOB [birth year], MRN [id number]  PCP: [name], Connor L, MD Primary Cardiologist: [name] Electrophysiologist: [name]  CC: Pacemaker follow-up  Connor PernaJohn J Poole is a 70 y.o. male seen today for Dr [name].  He presents today for routine electrophysiology followup.  Since last being seen in our clinic, the patient reports doing reasonably well.  He complains on nonspecific chest pain that is bilateral, does not occur with exertion, and is not relieved with rest. He has significant dementia and it is difficult to obtain any other specific information.  [facility].  He has advanced dementia.  He denies palpitations, dyspnea, PND, orthopnea, nausea, vomiting, dizziness, syncope, edema, weight gain, or early satiety.  Device History: MDT dual chamber PPM implanted 2015 for complete heart block    Past Medical History:  Diagnosis Date  . Alzheimer's dementia    a. early.  . Anxiety state, unspecified   . CAD (coronary artery disease)    CAD with prior PCI. a. 04/2010 CABG x 5: LIMA->LAD, VG->RI, VG->LCX, VG->RPDA->RPL  . CHB (complete heart block) (HCC)   . Colon polyps   . Depressive disorder, not elsewhere classified   . Esophageal reflux   . Essential hypertension, benign   . Hypercholesteremia   . Lesion of lung    Stable and felt inflammatory  . Multinodular goiter   . Myalgia   . Obesity (BMI 30-39.9)   . OSA (obstructive sleep apnea)   . Other and unspecified hyperlipidemia   . Pain in joint, shoulder region    Currently resolved off statin therapy  . Pulmonary nodules   . Vitamin D deficiency    Past Surgical History:  Procedure Laterality Date  . ACHILLES TENDON REPAIR    . CORONARY ANGIOPLASTY WITH STENT PLACEMENT  02/1998  . Coronary artery bypass grafting x5 with left  05/17/2010   CABG x 5 with LIMA to LAD, SVG to RI, SVG to Cfx and seq. SVG to PDA and PLOM  . PACEMAKER INSERTION   06-24-2013   MDT ADDRL1 pacemaker implanted by Dr [name] for intermittent CHB associated w syncope  . PERMANENT PACEMAKER INSERTION N/A 06/24/2013   Procedure: PERMANENT PACEMAKER INSERTION;  Surgeon: Connor RhymeJames D Allred, MD;  Location: [facility] CATH [facility];  Service: Cardiovascular;  Laterality: N/A;  . TEMPORARY PACEMAKER INSERTION N/A 05/11/2013   Procedure: TEMPORARY PACEMAKER INSERTION;  Surgeon: Connor CraftsJayadeep S Varanasi, MD;  [facility];  Service: Cardiovascular;  Laterality: N/A;    Current Outpatient Prescriptions  Medication Sig Dispense Refill  . acetaminophen (TYLENOL) 650 MG CR tablet Take 650 mg by mouth 2 (two) times daily.     [name]. amLODipine (NORVASC) 5 MG tablet Take 5 mg by mouth every morning.     [name]. aspirin 81 MG tablet Take by mouth daily.    . benazepril (LOTENSIN) 10 MG tablet Take 10 mg by mouth daily.   0  . donepezil (ARICEPT) 10 MG tablet Take 10 mg by mouth every morning.     . gabapentin (NEURONTIN) 300 MG capsule Take 300 mg by mouth 3 (three) times daily.   0  . nitroGLYCERIN (NITROSTAT) 0.4 MG SL tablet Place 1 tablet (0.4 mg total) under the tongue every 5 (five) minutes x 3 doses as needed for chest pain. 25 tablet 2  . omeprazole (PRILOSEC) 20 MG capsule Take 20 mg by mouth every evening.     . rosuvastatin (CRESTOR) 40 MG  tablet Take 40 mg by mouth every morning.     . sertraline (ZOLOFT) 100 MG tablet Take 50-100 mg by mouth 2 (two) times daily. Patient takes 50 mg in the morning and 100 mg at night     No current facility-administered medications for this visit.     Allergies:   Metoprolol   Social History: Social History   Social History  . Marital status: Married    Spouse name: N/A  . Number of children: N/A  . Years of education: N/A   Occupational History  . Not on file.   Social History Main Topics  . Smoking status: Former Smoker    Packs/day: 15.00    Types: Cigarettes    Quit date: 01/24/1979  . Smokeless tobacco: Not on file  . Alcohol use Yes       Comment: 7-10 drinks per week  . Drug use: No  . Sexual activity: Not on file   Other Topics Concern  . Not on file   Social History Narrative   [name].    Family History: Family History  Problem Relation Age of Onset  . Heart disease Father     no premature CAD.  [name]. CAD Father   . Congestive Heart Failure Mother   . Diabetes Mother     DM  . Cancer - Other Brother   . Diabetes Brother     DM  . Hypertension Brother   . CAD Brother   . Heart murmur Sister   . Diabetes Maternal Grandmother     DM  . Heart attack Paternal Grandfather   . CAD Paternal Grandfather      Review of Systems: All other systems reviewed and are otherwise negative except as noted above.   Physical Exam: VS:  BP 124/68   Pulse 63   Ht 5\' 11"  (1.803 m)   Wt 227 lb (103 kg)   BMI 31.66 kg/m  , BMI Body mass index is 31.66 kg/m.  GEN- The patient is well appearing, alert but not oriented  HEENT: normocephalic, atraumatic; sclera clear, conjunctiva pink; hearing intact; oropharynx clear; neck supple  Lungs- Clear to ausculation bilaterally, normal work of breathing.  No wheezes, rales, rhonchi Heart- Regular rate and rhythm  GI- soft, non-tender, non-distended, bowel sounds present  Extremities- no clubbing, cyanosis, or edema; DP/PT/radial pulses 2+ bilaterally MS- no significant deformity or atrophy Skin- warm and dry, no rash or lesion; PPM pocket well healed Psych- euthymic mood, full affect Neuro- strength and sensation are intact  PPM Interrogation- reviewed in detail today,  See PACEART report  EKG:  EKG is not ordered today.  Recent Labs: No results found for requested labs within last 8760 hours.   Wt Readings from Last 3 Encounters:  12/30/15 227 lb (103 kg)  12/30/15 240 lb (108.9 kg)  11/16/14 240 lb 3.2 oz (109 kg)     Other studies Reviewed: Additional studies/ records that were reviewed today include: Dr Jenel LucksAllred's office notes  Assessment  and Plan:  1.  Complete heart block  Normal PPM function See Pace Art report No changes today  2.  OSA Compliant with CPAP  3.  CAD He has been having atypical chest pain that is not convincing for cardiac etiology I discussed with his wife today that we have the option of doing further cardiac testing but with advanced dementia, his wife would like to avoid for now. I agree that this is appropriate.  4.  HTN Stable No change required today    Current medicines are reviewed at length with the patient today.   The patient does not have concerns regarding his medicines.  The following changes were made today:  none  Labs/ tests ordered today include:  Orders Placed This Encounter  Procedures  . Implantable device check     Disposition:   Follow up with [facility], Dr Connor Frame 1 year    Signed, Gypsy Balsam, NP 12/31/2015 12:19 PM  [facility] [address] [place] [address] [phone] (office) [phone] (fax

## 2015-12-30 ENCOUNTER — Ambulatory Visit (INDEPENDENT_AMBULATORY_CARE_PROVIDER_SITE_OTHER): Payer: Medicare Other | Admitting: Nurse Practitioner

## 2015-12-30 ENCOUNTER — Encounter: Payer: Self-pay | Admitting: Podiatry

## 2015-12-30 ENCOUNTER — Ambulatory Visit (INDEPENDENT_AMBULATORY_CARE_PROVIDER_SITE_OTHER): Payer: Medicare Other | Admitting: Podiatry

## 2015-12-30 VITALS — Ht 71.0 in | Wt 240.0 lb

## 2015-12-30 VITALS — BP 124/68 | HR 63 | Ht 71.0 in | Wt 227.0 lb

## 2015-12-30 DIAGNOSIS — I442 Atrioventricular block, complete: Secondary | ICD-10-CM

## 2015-12-30 DIAGNOSIS — B351 Tinea unguium: Secondary | ICD-10-CM

## 2015-12-30 DIAGNOSIS — G4733 Obstructive sleep apnea (adult) (pediatric): Secondary | ICD-10-CM

## 2015-12-30 DIAGNOSIS — M79675 Pain in left toe(s): Secondary | ICD-10-CM | POA: Diagnosis not present

## 2015-12-30 DIAGNOSIS — I10 Essential (primary) hypertension: Secondary | ICD-10-CM

## 2015-12-30 DIAGNOSIS — M79674 Pain in right toe(s): Secondary | ICD-10-CM

## 2015-12-30 DIAGNOSIS — Z9989 Dependence on other enabling machines and devices: Secondary | ICD-10-CM | POA: Diagnosis not present

## 2015-12-30 NOTE — Progress Notes (Signed)
Patient ID: Connor PernaJohn J Sawatzky, male   DOB: 1945-08-02, 70 y.o.   MRN: [id number] Complaint:  Visit Type: Patient returns to my office for continued evaluation of his feet.  He has alzheimers and says there is pain due to to his toes.. Complaint: Patient states" my nails have grown long and thick and become painful to walk and wear shoes" . The patient presents for preventative foot care services.  Vascular: dorsalis pedis and posterior tibial pulses are palpable bilateral. Capillary return is immediate. Temperature gradient is WNL. Skin turgor WNL  Sensorium: Normal Semmes Weinstein monofilament test. Normal tactile sensation bilaterally. Nail Exam: Pt has thick disfigured discolored nails with subungual debris noted bilateral entire nail hallux  Ulcer Exam: There is no evidence of ulcer or pre-ulcerative changes or infection. Orthopedic Exam: Muscle tone and strength are WNL. No limitations in general ROM. No crepitus or effusions noted. Foot type and digits show no abnormalities. Bony prominences are unremarkable. Asymptomatic HAV. Skin: No Porokeratosis. No infection or ulcers.  Pinch callus right hallux.  Diagnosis:  Onychomycosis, , Pain in right toe, pain in left toes  Treatment & Plan Procedures and Treatment: Consent by patient was obtained for treatment procedures. The patient understood the discussion of treatment and procedures well. All questions were answered thoroughly reviewed. Debridement of mycotic and hypertrophic toenails, 1 through 5 bilateral and clearing of subungual debris. No ulceration, no infection noted.  Return Visit-Office Procedure: Patient instructed to return to the office for a follow up visit 3 months for continued evaluation and treatment.  Helane GuntherGregory Neiva Maenza DPM

## 2015-12-30 NOTE — Patient Instructions (Signed)
Medication Instructions:   Your physician recommends that you continue on your current medications as directed. Please refer to the Current Medication list given to you today.   If you need a refill on your cardiac medications before your next appointment, please call your pharmacy.  Labwork: NONE ORDERED  TODAY    Testing/Procedures: NONE ORDERED  TODAY    Follow-Up:  Your physician wants you to follow-up in: ONE YEAR WITH ALLRED You will receive a reminder letter in the mail two months in advance. If you don't receive a letter, please call our office to schedule the follow-up appointment.      Any Other Special Instructions Will Be Listed Below (If Applicable).                                                                                                                                                   

## 2015-12-31 LAB — CUP PACEART INCLINIC DEVICE CHECK
Implantable Lead Implant Date: 20150602
Implantable Lead Implant Date: 20150602
Implantable Lead Location: 753860
Implantable Lead Model: 5076
MDC IDC LEAD LOCATION: 753859
MDC IDC PG IMPLANT DT: 20150602
MDC IDC SESS DTM: 20171208095613

## 2016-02-15 DIAGNOSIS — G309 Alzheimer's disease, unspecified: Secondary | ICD-10-CM | POA: Diagnosis not present

## 2016-02-15 DIAGNOSIS — B356 Tinea cruris: Secondary | ICD-10-CM | POA: Diagnosis not present

## 2016-03-30 ENCOUNTER — Ambulatory Visit (INDEPENDENT_AMBULATORY_CARE_PROVIDER_SITE_OTHER): Payer: Medicare Other | Admitting: Podiatry

## 2016-03-30 ENCOUNTER — Encounter: Payer: Self-pay | Admitting: Podiatry

## 2016-03-30 DIAGNOSIS — B351 Tinea unguium: Secondary | ICD-10-CM | POA: Diagnosis not present

## 2016-03-30 DIAGNOSIS — M79675 Pain in left toe(s): Secondary | ICD-10-CM | POA: Diagnosis not present

## 2016-03-30 NOTE — Progress Notes (Signed)
Patient ID: Connor PernaJohn J Poole, male   DOB: [birth year], 71 y.o.   MRN: [id number] Complaint:  Visit Type: Patient returns to my office for continued evaluation of his feet.  He has alzheimers and says there is pain due to to his toes.. Complaint: Patient states" my nails have grown long and thick and become painful to walk and wear shoes" . The patient presents for preventative foot care services.  Vascular: dorsalis pedis and posterior tibial pulses are palpable bilateral. Capillary return is immediate. Temperature gradient is WNL. Skin turgor WNL  Sensorium: Normal Semmes Weinstein monofilament test. Normal tactile sensation bilaterally. Nail Exam: Pt has thick disfigured discolored nails with subungual debris noted bilateral entire nail hallux  Ulcer Exam: There is no evidence of ulcer or pre-ulcerative changes or infection. Orthopedic Exam: Muscle tone and strength are WNL. No limitations in general ROM. No crepitus or effusions noted. Foot type and digits show no abnormalities. Bony prominences are unremarkable. Asymptomatic HAV. Skin: No Porokeratosis. No infection or ulcers.  Pinch callus right hallux.  Diagnosis:  Onychomycosis, , Pain in right toe, pain in left toes  Treatment & Plan Procedures and Treatment: Consent by patient was obtained for treatment procedures. The patient understood the discussion of treatment and procedures well. All questions were answered thoroughly reviewed. Debridement of mycotic and hypertrophic toenails, 1 through 5 bilateral and clearing of subungual debris. No ulceration, no infection noted.  Return Visit-Office Procedure: Patient instructed to return to the office for a follow up visit 3 months for continued evaluation and treatment.  [name] DPM

## 2016-05-02 DIAGNOSIS — G301 Alzheimer's disease with late onset: Secondary | ICD-10-CM | POA: Diagnosis not present

## 2016-05-09 DIAGNOSIS — I2510 Atherosclerotic heart disease of native coronary artery without angina pectoris: Secondary | ICD-10-CM | POA: Diagnosis not present

## 2016-05-09 DIAGNOSIS — K219 Gastro-esophageal reflux disease without esophagitis: Secondary | ICD-10-CM | POA: Diagnosis not present

## 2016-05-09 DIAGNOSIS — G309 Alzheimer's disease, unspecified: Secondary | ICD-10-CM | POA: Diagnosis not present

## 2016-05-09 DIAGNOSIS — Z0000 Encounter for general adult medical examination without abnormal findings: Secondary | ICD-10-CM | POA: Diagnosis not present

## 2016-05-09 DIAGNOSIS — F329 Major depressive disorder, single episode, unspecified: Secondary | ICD-10-CM | POA: Diagnosis not present

## 2016-06-30 ENCOUNTER — Ambulatory Visit: Payer: Self-pay | Admitting: Podiatry

## 2016-07-14 ENCOUNTER — Ambulatory Visit (INDEPENDENT_AMBULATORY_CARE_PROVIDER_SITE_OTHER): Payer: Medicare HMO | Admitting: Podiatry

## 2016-07-14 ENCOUNTER — Encounter: Payer: Self-pay | Admitting: Podiatry

## 2016-07-14 DIAGNOSIS — B351 Tinea unguium: Secondary | ICD-10-CM

## 2016-07-14 DIAGNOSIS — M79676 Pain in unspecified toe(s): Secondary | ICD-10-CM

## 2016-07-14 DIAGNOSIS — M79675 Pain in left toe(s): Principal | ICD-10-CM

## 2016-07-14 NOTE — Progress Notes (Signed)
Patient ID: Connor PernaJohn J Weitzel, male   DOB: [birth year], 71 y.o.   MRN: [id number] Complaint:  Visit Type: Patient returns to my office for continued evaluation of his feet.  He has alzheimers and says there is pain due to to his toes.. Complaint: Patient states" my nails have grown long and thick and become painful to walk and wear shoes" . The patient presents for preventative foot care services.  Vascular: dorsalis pedis and posterior tibial pulses are palpable bilateral. Capillary return is immediate. Temperature gradient is WNL. Skin turgor WNL  Sensorium: Normal Semmes Weinstein monofilament test. Normal tactile sensation bilaterally. Nail Exam: Pt has thick disfigured discolored nails with subungual debris noted bilateral entire nail hallux  Ulcer Exam: There is no evidence of ulcer or pre-ulcerative changes or infection. Orthopedic Exam: Muscle tone and strength are WNL. No limitations in general ROM. No crepitus or effusions noted. Foot type and digits show no abnormalities. Bony prominences are unremarkable. Asymptomatic HAV. Skin: No Porokeratosis. No infection or ulcers.  Pinch callus right hallux.  Diagnosis:  Onychomycosis, , Pain in right toe, pain in left toes  Treatment & Plan Procedures and Treatment: Consent by patient was obtained for treatment procedures. The patient understood the discussion of treatment and procedures well. All questions were answered thoroughly reviewed. Debridement of mycotic and hypertrophic toenails, 1 through 5 bilateral and clearing of subungual debris. No ulceration, no infection noted.  Return Visit-Office Procedure: Patient instructed to return to the office for a follow up visit 3 months for continued evaluation and treatment.  Helane GuntherGregory Winn Muehl DPM

## 2016-07-17 DIAGNOSIS — G309 Alzheimer's disease, unspecified: Secondary | ICD-10-CM | POA: Diagnosis not present

## 2016-07-17 DIAGNOSIS — L219 Seborrheic dermatitis, unspecified: Secondary | ICD-10-CM | POA: Diagnosis not present

## 2016-09-05 DIAGNOSIS — Z111 Encounter for screening for respiratory tuberculosis: Secondary | ICD-10-CM | POA: Diagnosis not present

## 2016-09-21 DIAGNOSIS — G894 Chronic pain syndrome: Secondary | ICD-10-CM | POA: Diagnosis not present

## 2016-09-21 DIAGNOSIS — K219 Gastro-esophageal reflux disease without esophagitis: Secondary | ICD-10-CM | POA: Diagnosis not present

## 2016-09-21 DIAGNOSIS — G9009 Other idiopathic peripheral autonomic neuropathy: Secondary | ICD-10-CM | POA: Diagnosis not present

## 2016-09-21 DIAGNOSIS — M150 Primary generalized (osteo)arthritis: Secondary | ICD-10-CM | POA: Diagnosis not present

## 2016-09-21 DIAGNOSIS — L853 Xerosis cutis: Secondary | ICD-10-CM | POA: Diagnosis not present

## 2016-09-21 DIAGNOSIS — I10 Essential (primary) hypertension: Secondary | ICD-10-CM | POA: Diagnosis not present

## 2016-09-27 DIAGNOSIS — G309 Alzheimer's disease, unspecified: Secondary | ICD-10-CM | POA: Diagnosis not present

## 2016-09-27 DIAGNOSIS — F419 Anxiety disorder, unspecified: Secondary | ICD-10-CM | POA: Diagnosis not present

## 2016-09-29 DIAGNOSIS — F419 Anxiety disorder, unspecified: Secondary | ICD-10-CM | POA: Diagnosis not present

## 2016-09-29 DIAGNOSIS — G309 Alzheimer's disease, unspecified: Secondary | ICD-10-CM | POA: Diagnosis not present

## 2016-10-11 DIAGNOSIS — G309 Alzheimer's disease, unspecified: Secondary | ICD-10-CM | POA: Diagnosis not present

## 2016-10-11 DIAGNOSIS — F419 Anxiety disorder, unspecified: Secondary | ICD-10-CM | POA: Diagnosis not present

## 2016-10-19 DIAGNOSIS — G9009 Other idiopathic peripheral autonomic neuropathy: Secondary | ICD-10-CM | POA: Diagnosis not present

## 2016-10-19 DIAGNOSIS — M150 Primary generalized (osteo)arthritis: Secondary | ICD-10-CM | POA: Diagnosis not present

## 2016-10-19 DIAGNOSIS — I10 Essential (primary) hypertension: Secondary | ICD-10-CM | POA: Diagnosis not present

## 2016-10-19 DIAGNOSIS — G894 Chronic pain syndrome: Secondary | ICD-10-CM | POA: Diagnosis not present

## 2016-10-19 DIAGNOSIS — K219 Gastro-esophageal reflux disease without esophagitis: Secondary | ICD-10-CM | POA: Diagnosis not present

## 2016-10-19 DIAGNOSIS — L853 Xerosis cutis: Secondary | ICD-10-CM | POA: Diagnosis not present

## 2016-10-20 ENCOUNTER — Ambulatory Visit: Payer: Medicare HMO | Admitting: Podiatry

## 2016-10-25 DIAGNOSIS — F419 Anxiety disorder, unspecified: Secondary | ICD-10-CM | POA: Diagnosis not present

## 2016-10-25 DIAGNOSIS — G300 Alzheimer's disease with early onset: Secondary | ICD-10-CM | POA: Diagnosis not present

## 2016-10-25 DIAGNOSIS — F0281 Dementia in other diseases classified elsewhere with behavioral disturbance: Secondary | ICD-10-CM | POA: Diagnosis not present

## 2016-10-30 DIAGNOSIS — B351 Tinea unguium: Secondary | ICD-10-CM | POA: Diagnosis not present

## 2016-10-30 DIAGNOSIS — Q845 Enlarged and hypertrophic nails: Secondary | ICD-10-CM | POA: Diagnosis not present

## 2016-10-30 DIAGNOSIS — M2010 Hallux valgus (acquired), unspecified foot: Secondary | ICD-10-CM | POA: Diagnosis not present

## 2016-10-30 DIAGNOSIS — L603 Nail dystrophy: Secondary | ICD-10-CM | POA: Diagnosis not present

## 2016-11-01 DIAGNOSIS — G300 Alzheimer's disease with early onset: Secondary | ICD-10-CM | POA: Diagnosis not present

## 2016-11-08 DIAGNOSIS — G300 Alzheimer's disease with early onset: Secondary | ICD-10-CM | POA: Diagnosis not present

## 2016-11-08 DIAGNOSIS — F419 Anxiety disorder, unspecified: Secondary | ICD-10-CM | POA: Diagnosis not present

## 2016-11-08 DIAGNOSIS — G4700 Insomnia, unspecified: Secondary | ICD-10-CM | POA: Diagnosis not present

## 2016-11-08 DIAGNOSIS — F0281 Dementia in other diseases classified elsewhere with behavioral disturbance: Secondary | ICD-10-CM | POA: Diagnosis not present

## 2016-11-22 DIAGNOSIS — G9009 Other idiopathic peripheral autonomic neuropathy: Secondary | ICD-10-CM | POA: Diagnosis not present

## 2016-11-22 DIAGNOSIS — L853 Xerosis cutis: Secondary | ICD-10-CM | POA: Diagnosis not present

## 2016-11-22 DIAGNOSIS — F0281 Dementia in other diseases classified elsewhere with behavioral disturbance: Secondary | ICD-10-CM | POA: Diagnosis not present

## 2016-11-22 DIAGNOSIS — F419 Anxiety disorder, unspecified: Secondary | ICD-10-CM | POA: Diagnosis not present

## 2016-11-22 DIAGNOSIS — I10 Essential (primary) hypertension: Secondary | ICD-10-CM | POA: Diagnosis not present

## 2016-11-22 DIAGNOSIS — M150 Primary generalized (osteo)arthritis: Secondary | ICD-10-CM | POA: Diagnosis not present

## 2016-11-22 DIAGNOSIS — K219 Gastro-esophageal reflux disease without esophagitis: Secondary | ICD-10-CM | POA: Diagnosis not present

## 2016-11-22 DIAGNOSIS — G894 Chronic pain syndrome: Secondary | ICD-10-CM | POA: Diagnosis not present

## 2016-11-22 DIAGNOSIS — G4700 Insomnia, unspecified: Secondary | ICD-10-CM | POA: Diagnosis not present

## 2016-11-22 DIAGNOSIS — G300 Alzheimer's disease with early onset: Secondary | ICD-10-CM | POA: Diagnosis not present

## 2016-11-24 DIAGNOSIS — H25813 Combined forms of age-related cataract, bilateral: Secondary | ICD-10-CM | POA: Diagnosis not present

## 2016-11-29 DIAGNOSIS — E119 Type 2 diabetes mellitus without complications: Secondary | ICD-10-CM | POA: Diagnosis not present

## 2016-11-29 DIAGNOSIS — E039 Hypothyroidism, unspecified: Secondary | ICD-10-CM | POA: Diagnosis not present

## 2016-11-29 DIAGNOSIS — I10 Essential (primary) hypertension: Secondary | ICD-10-CM | POA: Diagnosis not present

## 2016-11-29 DIAGNOSIS — E782 Mixed hyperlipidemia: Secondary | ICD-10-CM | POA: Diagnosis not present

## 2016-12-06 DIAGNOSIS — F0281 Dementia in other diseases classified elsewhere with behavioral disturbance: Secondary | ICD-10-CM | POA: Diagnosis not present

## 2016-12-06 DIAGNOSIS — F419 Anxiety disorder, unspecified: Secondary | ICD-10-CM | POA: Diagnosis not present

## 2016-12-06 DIAGNOSIS — G300 Alzheimer's disease with early onset: Secondary | ICD-10-CM | POA: Diagnosis not present

## 2016-12-06 DIAGNOSIS — G4700 Insomnia, unspecified: Secondary | ICD-10-CM | POA: Diagnosis not present

## 2016-12-15 DIAGNOSIS — B351 Tinea unguium: Secondary | ICD-10-CM | POA: Diagnosis not present

## 2016-12-21 DIAGNOSIS — I10 Essential (primary) hypertension: Secondary | ICD-10-CM | POA: Diagnosis not present

## 2016-12-21 DIAGNOSIS — G9009 Other idiopathic peripheral autonomic neuropathy: Secondary | ICD-10-CM | POA: Diagnosis not present

## 2016-12-21 DIAGNOSIS — L853 Xerosis cutis: Secondary | ICD-10-CM | POA: Diagnosis not present

## 2016-12-21 DIAGNOSIS — K219 Gastro-esophageal reflux disease without esophagitis: Secondary | ICD-10-CM | POA: Diagnosis not present

## 2016-12-21 DIAGNOSIS — M150 Primary generalized (osteo)arthritis: Secondary | ICD-10-CM | POA: Diagnosis not present

## 2016-12-21 DIAGNOSIS — G894 Chronic pain syndrome: Secondary | ICD-10-CM | POA: Diagnosis not present

## 2016-12-27 DIAGNOSIS — F419 Anxiety disorder, unspecified: Secondary | ICD-10-CM | POA: Diagnosis not present

## 2016-12-27 DIAGNOSIS — F0281 Dementia in other diseases classified elsewhere with behavioral disturbance: Secondary | ICD-10-CM | POA: Diagnosis not present

## 2016-12-27 DIAGNOSIS — G300 Alzheimer's disease with early onset: Secondary | ICD-10-CM | POA: Diagnosis not present

## 2016-12-27 DIAGNOSIS — G4700 Insomnia, unspecified: Secondary | ICD-10-CM | POA: Diagnosis not present

## 2017-01-12 DIAGNOSIS — I10 Essential (primary) hypertension: Secondary | ICD-10-CM | POA: Diagnosis not present

## 2017-01-12 DIAGNOSIS — F329 Major depressive disorder, single episode, unspecified: Secondary | ICD-10-CM | POA: Diagnosis not present

## 2017-01-12 DIAGNOSIS — R634 Abnormal weight loss: Secondary | ICD-10-CM | POA: Diagnosis not present

## 2017-01-12 DIAGNOSIS — G309 Alzheimer's disease, unspecified: Secondary | ICD-10-CM | POA: Diagnosis not present

## 2017-01-12 DIAGNOSIS — E042 Nontoxic multinodular goiter: Secondary | ICD-10-CM | POA: Diagnosis not present

## 2017-01-12 DIAGNOSIS — F419 Anxiety disorder, unspecified: Secondary | ICD-10-CM | POA: Diagnosis not present

## 2017-01-12 DIAGNOSIS — E782 Mixed hyperlipidemia: Secondary | ICD-10-CM | POA: Diagnosis not present

## 2017-01-12 DIAGNOSIS — K219 Gastro-esophageal reflux disease without esophagitis: Secondary | ICD-10-CM | POA: Diagnosis not present

## 2017-01-19 ENCOUNTER — Telehealth: Payer: Self-pay | Admitting: *Deleted

## 2017-01-19 ENCOUNTER — Encounter: Payer: Self-pay | Admitting: Podiatry

## 2017-01-19 ENCOUNTER — Ambulatory Visit: Payer: Medicare HMO | Admitting: Podiatry

## 2017-01-19 DIAGNOSIS — B351 Tinea unguium: Secondary | ICD-10-CM

## 2017-01-19 DIAGNOSIS — L84 Corns and callosities: Secondary | ICD-10-CM

## 2017-01-19 DIAGNOSIS — L03031 Cellulitis of right toe: Secondary | ICD-10-CM

## 2017-01-19 DIAGNOSIS — M79675 Pain in left toe(s): Secondary | ICD-10-CM | POA: Diagnosis not present

## 2017-01-19 DIAGNOSIS — L02611 Cutaneous abscess of right foot: Secondary | ICD-10-CM | POA: Diagnosis not present

## 2017-01-19 MED ORDER — CEPHALEXIN 500 MG PO CAPS: 500.0000 mg | ORAL_CAPSULE | Freq: Two times a day (BID) | ORAL | 0 refills | Status: AC

## 2017-01-19 NOTE — Progress Notes (Signed)
Patient ID: Connor PernaJohn J Martinec, male   DOB: 19-Jul-1945, 71 y.o.   MRN: [id number] Complaint:  Visit Type: Patient returns to my office for continued evaluation of his feet.  He has alzheimers and presents to the office with his wife.  His wife says that he has been moved [place] a facility in [place].  The facility states they had a podiatrist who could care for his feet.  He was seen once by that podiatrist and she determined she would rather be seen by myself in this office.  He has developed extremely thick painful toenails on his right foot greater than his left.  He has yellow fluctuance noted behind the third toenail of the right foot. There is also an extremely thickened callus noted on the tip of the third toe, right foot.  He presents the office today for continued evaluation and treatment of his nails both feet      Vascular: dorsalis pedis and posterior tibial pulses are palpable bilateral. Capillary return is immediate. Temperature gradient is WNL. Skin turgor WNL  Sensorium: Normal Semmes Weinstein monofilament test. Normal tactile sensation bilaterally. Nail Exam: Pt has thick disfigured discolored nails with subungual debris noted bilateral entire nail hallux  Ulcer Exam: There is no evidence of ulcer or pre-ulcerative changes or infection. Orthopedic Exam: Muscle tone and strength are WNL. No limitations in general ROM. No crepitus or effusions noted. Foot type and digits show no abnormalities. Bony prominences are unremarkable. Asymptomatic HAV. Skin: No Porokeratosis. No infection or ulcers.  Pinch callus right hallux. , There is a yellowed abscess developing proximal to the toenail third toe, right foot.    Diagnosis:  Onychomycosis, , Pain in right toe, pain in left toes Abscess 3rd toe right foot.  Clavi 3rd toe right foot.  Treatment & Plan Procedures and Treatment: Consent by patient was obtained for treatment procedures. The patient understood the discussion of treatment  and procedures well. All questions were answered thoroughly reviewed. Debridement of mycotic and hypertrophic toenails, 1 through 5 bilateral and clearing of subungual debris. No ulceration, no infection noted. Incision and drainage of an abscess third toe, right foot.  Neosporin dry sterile dressing.  Home soaking instructions were given to this patient.  Patient was prescribed cephalexin 500 mg #15 with instructions to take one twice a day.   Return Visit-Office Procedure: Patient instructed to return to the office for a follow up visit 3 months for continued evaluation and treatment.  Helane GuntherGregory Allyse Fregeau DPM

## 2017-01-19 NOTE — Telephone Encounter (Signed)
Olegario MessierKathy - [facility] states needs [facility] to take care of pt's minor surgery site, because they can not perform wound care. I told Olegario MessierKathy I would send referral to [facility], but his insurance may not cover and [facility] may not perform then pt's wife or another care giver would need to perform. Faxed required form with orders to perform daily soaks of 1 tablespoon of antibacterial soap in 1 quart warm water for 20 mins, rinse, pat dry, apply neosporin gauze pad to procedure toe of 01/19/2017 and wrap with gauze to secure, perform daily until seen again by Dr. Helane GuntherGregory Mayer 02/02/2017. Faxed to [facility] with demographics, clinicals were not available.

## 2017-02-02 ENCOUNTER — Ambulatory Visit: Payer: Medicare HMO | Admitting: Podiatry

## 2017-03-09 DIAGNOSIS — Z111 Encounter for screening for respiratory tuberculosis: Secondary | ICD-10-CM | POA: Diagnosis not present

## 2017-03-30 DIAGNOSIS — F419 Anxiety disorder, unspecified: Secondary | ICD-10-CM | POA: Diagnosis not present

## 2017-03-30 DIAGNOSIS — F0281 Dementia in other diseases classified elsewhere with behavioral disturbance: Secondary | ICD-10-CM | POA: Diagnosis not present

## 2017-03-30 DIAGNOSIS — G4700 Insomnia, unspecified: Secondary | ICD-10-CM | POA: Diagnosis not present

## 2017-03-30 DIAGNOSIS — G300 Alzheimer's disease with early onset: Secondary | ICD-10-CM | POA: Diagnosis not present

## 2017-04-10 DIAGNOSIS — L84 Corns and callosities: Secondary | ICD-10-CM | POA: Diagnosis not present

## 2017-04-10 DIAGNOSIS — L603 Nail dystrophy: Secondary | ICD-10-CM | POA: Diagnosis not present

## 2017-04-10 DIAGNOSIS — Q845 Enlarged and hypertrophic nails: Secondary | ICD-10-CM | POA: Diagnosis not present

## 2017-04-10 DIAGNOSIS — B351 Tinea unguium: Secondary | ICD-10-CM | POA: Diagnosis not present

## 2017-04-10 DIAGNOSIS — I739 Peripheral vascular disease, unspecified: Secondary | ICD-10-CM | POA: Diagnosis not present

## 2017-04-17 DIAGNOSIS — G9009 Other idiopathic peripheral autonomic neuropathy: Secondary | ICD-10-CM | POA: Diagnosis not present

## 2017-04-17 DIAGNOSIS — G894 Chronic pain syndrome: Secondary | ICD-10-CM | POA: Diagnosis not present

## 2017-04-17 DIAGNOSIS — M150 Primary generalized (osteo)arthritis: Secondary | ICD-10-CM | POA: Diagnosis not present

## 2017-04-17 DIAGNOSIS — I10 Essential (primary) hypertension: Secondary | ICD-10-CM | POA: Diagnosis not present

## 2017-04-17 DIAGNOSIS — L853 Xerosis cutis: Secondary | ICD-10-CM | POA: Diagnosis not present

## 2017-04-17 DIAGNOSIS — K219 Gastro-esophageal reflux disease without esophagitis: Secondary | ICD-10-CM | POA: Diagnosis not present

## 2017-04-18 DIAGNOSIS — Z79899 Other long term (current) drug therapy: Secondary | ICD-10-CM | POA: Diagnosis not present

## 2017-04-18 DIAGNOSIS — Z888 Allergy status to other drugs, medicaments and biological substances status: Secondary | ICD-10-CM | POA: Diagnosis not present

## 2017-04-18 DIAGNOSIS — Z0489 Encounter for examination and observation for other specified reasons: Secondary | ICD-10-CM | POA: Diagnosis not present

## 2017-04-18 DIAGNOSIS — E785 Hyperlipidemia, unspecified: Secondary | ICD-10-CM | POA: Diagnosis not present

## 2017-04-18 DIAGNOSIS — G4730 Sleep apnea, unspecified: Secondary | ICD-10-CM | POA: Diagnosis not present

## 2017-04-18 DIAGNOSIS — F0390 Unspecified dementia without behavioral disturbance: Secondary | ICD-10-CM | POA: Diagnosis not present

## 2017-04-18 DIAGNOSIS — Z0000 Encounter for general adult medical examination without abnormal findings: Secondary | ICD-10-CM | POA: Diagnosis not present

## 2017-04-18 DIAGNOSIS — M542 Cervicalgia: Secondary | ICD-10-CM | POA: Diagnosis not present

## 2017-04-20 ENCOUNTER — Ambulatory Visit: Payer: Medicare HMO | Admitting: Podiatry

## 2017-04-24 DIAGNOSIS — G309 Alzheimer's disease, unspecified: Secondary | ICD-10-CM | POA: Diagnosis not present

## 2017-04-24 DIAGNOSIS — R110 Nausea: Secondary | ICD-10-CM | POA: Diagnosis not present

## 2017-04-24 DIAGNOSIS — M150 Primary generalized (osteo)arthritis: Secondary | ICD-10-CM | POA: Diagnosis not present

## 2017-04-24 DIAGNOSIS — I10 Essential (primary) hypertension: Secondary | ICD-10-CM | POA: Diagnosis not present

## 2017-05-01 DIAGNOSIS — I10 Essential (primary) hypertension: Secondary | ICD-10-CM | POA: Diagnosis not present

## 2017-05-01 DIAGNOSIS — G309 Alzheimer's disease, unspecified: Secondary | ICD-10-CM | POA: Diagnosis not present

## 2017-05-01 DIAGNOSIS — I739 Peripheral vascular disease, unspecified: Secondary | ICD-10-CM | POA: Diagnosis not present

## 2017-05-03 DIAGNOSIS — K219 Gastro-esophageal reflux disease without esophagitis: Secondary | ICD-10-CM | POA: Diagnosis not present

## 2017-05-03 DIAGNOSIS — F0390 Unspecified dementia without behavioral disturbance: Secondary | ICD-10-CM | POA: Diagnosis not present

## 2017-05-03 DIAGNOSIS — R319 Hematuria, unspecified: Secondary | ICD-10-CM | POA: Diagnosis not present

## 2017-05-03 DIAGNOSIS — R402441 Other coma, without documented Glasgow coma scale score, or with partial score reported, in the field [EMT or ambulance]: Secondary | ICD-10-CM | POA: Diagnosis not present

## 2017-05-03 DIAGNOSIS — G4730 Sleep apnea, unspecified: Secondary | ICD-10-CM | POA: Diagnosis not present

## 2017-05-03 DIAGNOSIS — Z888 Allergy status to other drugs, medicaments and biological substances status: Secondary | ICD-10-CM | POA: Diagnosis not present

## 2017-05-03 DIAGNOSIS — R109 Unspecified abdominal pain: Secondary | ICD-10-CM | POA: Diagnosis not present

## 2017-05-03 DIAGNOSIS — E785 Hyperlipidemia, unspecified: Secondary | ICD-10-CM | POA: Diagnosis not present

## 2017-05-04 DIAGNOSIS — F419 Anxiety disorder, unspecified: Secondary | ICD-10-CM | POA: Diagnosis not present

## 2017-05-04 DIAGNOSIS — G300 Alzheimer's disease with early onset: Secondary | ICD-10-CM | POA: Diagnosis not present

## 2017-05-04 DIAGNOSIS — G4700 Insomnia, unspecified: Secondary | ICD-10-CM | POA: Diagnosis not present

## 2017-05-04 DIAGNOSIS — F0281 Dementia in other diseases classified elsewhere with behavioral disturbance: Secondary | ICD-10-CM | POA: Diagnosis not present

## 2017-05-22 DIAGNOSIS — R110 Nausea: Secondary | ICD-10-CM | POA: Diagnosis not present

## 2017-05-22 DIAGNOSIS — G309 Alzheimer's disease, unspecified: Secondary | ICD-10-CM | POA: Diagnosis not present

## 2017-05-22 DIAGNOSIS — M150 Primary generalized (osteo)arthritis: Secondary | ICD-10-CM | POA: Diagnosis not present

## 2017-05-22 DIAGNOSIS — K219 Gastro-esophageal reflux disease without esophagitis: Secondary | ICD-10-CM | POA: Diagnosis not present

## 2017-05-22 DIAGNOSIS — G9009 Other idiopathic peripheral autonomic neuropathy: Secondary | ICD-10-CM | POA: Diagnosis not present

## 2017-05-22 DIAGNOSIS — I10 Essential (primary) hypertension: Secondary | ICD-10-CM | POA: Diagnosis not present

## 2017-05-29 DIAGNOSIS — R070 Pain in throat: Secondary | ICD-10-CM | POA: Diagnosis not present

## 2017-06-01 DIAGNOSIS — F419 Anxiety disorder, unspecified: Secondary | ICD-10-CM | POA: Diagnosis not present

## 2017-06-01 DIAGNOSIS — G300 Alzheimer's disease with early onset: Secondary | ICD-10-CM | POA: Diagnosis not present

## 2017-06-01 DIAGNOSIS — F0281 Dementia in other diseases classified elsewhere with behavioral disturbance: Secondary | ICD-10-CM | POA: Diagnosis not present

## 2017-06-01 DIAGNOSIS — G4700 Insomnia, unspecified: Secondary | ICD-10-CM | POA: Diagnosis not present

## 2017-06-19 DIAGNOSIS — I10 Essential (primary) hypertension: Secondary | ICD-10-CM | POA: Diagnosis not present

## 2017-06-19 DIAGNOSIS — M150 Primary generalized (osteo)arthritis: Secondary | ICD-10-CM | POA: Diagnosis not present

## 2017-06-19 DIAGNOSIS — L89301 Pressure ulcer of unspecified buttock, stage 1: Secondary | ICD-10-CM | POA: Diagnosis not present

## 2017-06-19 DIAGNOSIS — G9009 Other idiopathic peripheral autonomic neuropathy: Secondary | ICD-10-CM | POA: Diagnosis not present

## 2017-06-19 DIAGNOSIS — G309 Alzheimer's disease, unspecified: Secondary | ICD-10-CM | POA: Diagnosis not present

## 2017-06-19 DIAGNOSIS — R4182 Altered mental status, unspecified: Secondary | ICD-10-CM | POA: Diagnosis not present

## 2017-06-19 DIAGNOSIS — K219 Gastro-esophageal reflux disease without esophagitis: Secondary | ICD-10-CM | POA: Diagnosis not present

## 2017-06-19 DIAGNOSIS — R110 Nausea: Secondary | ICD-10-CM | POA: Diagnosis not present

## 2017-06-23 DIAGNOSIS — R4182 Altered mental status, unspecified: Secondary | ICD-10-CM | POA: Diagnosis not present

## 2017-07-13 DIAGNOSIS — F5105 Insomnia due to other mental disorder: Secondary | ICD-10-CM | POA: Diagnosis not present

## 2017-07-13 DIAGNOSIS — G300 Alzheimer's disease with early onset: Secondary | ICD-10-CM | POA: Diagnosis not present

## 2017-07-13 DIAGNOSIS — F064 Anxiety disorder due to known physiological condition: Secondary | ICD-10-CM | POA: Diagnosis not present

## 2017-07-13 DIAGNOSIS — F0281 Dementia in other diseases classified elsewhere with behavioral disturbance: Secondary | ICD-10-CM | POA: Diagnosis not present

## 2017-07-17 DIAGNOSIS — G309 Alzheimer's disease, unspecified: Secondary | ICD-10-CM | POA: Diagnosis not present

## 2017-07-17 DIAGNOSIS — R110 Nausea: Secondary | ICD-10-CM | POA: Diagnosis not present

## 2017-07-17 DIAGNOSIS — I10 Essential (primary) hypertension: Secondary | ICD-10-CM | POA: Diagnosis not present

## 2017-07-17 DIAGNOSIS — M150 Primary generalized (osteo)arthritis: Secondary | ICD-10-CM | POA: Diagnosis not present

## 2017-07-17 DIAGNOSIS — G9009 Other idiopathic peripheral autonomic neuropathy: Secondary | ICD-10-CM | POA: Diagnosis not present

## 2017-07-17 DIAGNOSIS — L89301 Pressure ulcer of unspecified buttock, stage 1: Secondary | ICD-10-CM | POA: Diagnosis not present

## 2017-07-17 DIAGNOSIS — R4182 Altered mental status, unspecified: Secondary | ICD-10-CM | POA: Diagnosis not present

## 2017-07-17 DIAGNOSIS — K219 Gastro-esophageal reflux disease without esophagitis: Secondary | ICD-10-CM | POA: Diagnosis not present

## 2017-07-27 DIAGNOSIS — G300 Alzheimer's disease with early onset: Secondary | ICD-10-CM | POA: Diagnosis not present

## 2017-07-27 DIAGNOSIS — F5105 Insomnia due to other mental disorder: Secondary | ICD-10-CM | POA: Diagnosis not present

## 2017-07-27 DIAGNOSIS — F064 Anxiety disorder due to known physiological condition: Secondary | ICD-10-CM | POA: Diagnosis not present

## 2017-07-27 DIAGNOSIS — F0281 Dementia in other diseases classified elsewhere with behavioral disturbance: Secondary | ICD-10-CM | POA: Diagnosis not present

## 2017-08-10 ENCOUNTER — Encounter: Payer: Self-pay | Admitting: Cardiology

## 2017-09-18 DIAGNOSIS — G9009 Other idiopathic peripheral autonomic neuropathy: Secondary | ICD-10-CM | POA: Diagnosis not present

## 2017-09-18 DIAGNOSIS — L89301 Pressure ulcer of unspecified buttock, stage 1: Secondary | ICD-10-CM | POA: Diagnosis not present

## 2017-09-18 DIAGNOSIS — R110 Nausea: Secondary | ICD-10-CM | POA: Diagnosis not present

## 2017-09-18 DIAGNOSIS — K219 Gastro-esophageal reflux disease without esophagitis: Secondary | ICD-10-CM | POA: Diagnosis not present

## 2017-09-18 DIAGNOSIS — I10 Essential (primary) hypertension: Secondary | ICD-10-CM | POA: Diagnosis not present

## 2017-09-18 DIAGNOSIS — L309 Dermatitis, unspecified: Secondary | ICD-10-CM | POA: Diagnosis not present

## 2017-09-18 DIAGNOSIS — M150 Primary generalized (osteo)arthritis: Secondary | ICD-10-CM | POA: Diagnosis not present

## 2017-09-18 DIAGNOSIS — G309 Alzheimer's disease, unspecified: Secondary | ICD-10-CM | POA: Diagnosis not present

## 2017-09-18 DIAGNOSIS — R4182 Altered mental status, unspecified: Secondary | ICD-10-CM | POA: Diagnosis not present

## 2017-10-05 DIAGNOSIS — F5105 Insomnia due to other mental disorder: Secondary | ICD-10-CM | POA: Diagnosis not present

## 2017-10-05 DIAGNOSIS — G300 Alzheimer's disease with early onset: Secondary | ICD-10-CM | POA: Diagnosis not present

## 2017-10-05 DIAGNOSIS — F0281 Dementia in other diseases classified elsewhere with behavioral disturbance: Secondary | ICD-10-CM | POA: Diagnosis not present

## 2017-10-05 DIAGNOSIS — F064 Anxiety disorder due to known physiological condition: Secondary | ICD-10-CM | POA: Diagnosis not present

## 2017-10-30 DIAGNOSIS — M150 Primary generalized (osteo)arthritis: Secondary | ICD-10-CM | POA: Diagnosis not present

## 2017-10-30 DIAGNOSIS — I10 Essential (primary) hypertension: Secondary | ICD-10-CM | POA: Diagnosis not present

## 2017-10-30 DIAGNOSIS — R110 Nausea: Secondary | ICD-10-CM | POA: Diagnosis not present

## 2017-10-30 DIAGNOSIS — L89301 Pressure ulcer of unspecified buttock, stage 1: Secondary | ICD-10-CM | POA: Diagnosis not present

## 2017-10-30 DIAGNOSIS — G9009 Other idiopathic peripheral autonomic neuropathy: Secondary | ICD-10-CM | POA: Diagnosis not present

## 2017-10-30 DIAGNOSIS — K219 Gastro-esophageal reflux disease without esophagitis: Secondary | ICD-10-CM | POA: Diagnosis not present

## 2017-10-30 DIAGNOSIS — R4182 Altered mental status, unspecified: Secondary | ICD-10-CM | POA: Diagnosis not present

## 2017-10-30 DIAGNOSIS — G309 Alzheimer's disease, unspecified: Secondary | ICD-10-CM | POA: Diagnosis not present

## 2017-10-30 DIAGNOSIS — L309 Dermatitis, unspecified: Secondary | ICD-10-CM | POA: Diagnosis not present

## 2017-11-02 DIAGNOSIS — F064 Anxiety disorder due to known physiological condition: Secondary | ICD-10-CM | POA: Diagnosis not present

## 2017-11-02 DIAGNOSIS — F0281 Dementia in other diseases classified elsewhere with behavioral disturbance: Secondary | ICD-10-CM | POA: Diagnosis not present

## 2017-11-02 DIAGNOSIS — G300 Alzheimer's disease with early onset: Secondary | ICD-10-CM | POA: Diagnosis not present

## 2017-11-02 DIAGNOSIS — F5105 Insomnia due to other mental disorder: Secondary | ICD-10-CM | POA: Diagnosis not present

## 2018-01-07 DIAGNOSIS — M160 Bilateral primary osteoarthritis of hip: Secondary | ICD-10-CM | POA: Diagnosis not present

## 2018-01-07 DIAGNOSIS — Z79899 Other long term (current) drug therapy: Secondary | ICD-10-CM | POA: Diagnosis not present

## 2018-01-07 DIAGNOSIS — S0990XA Unspecified injury of head, initial encounter: Secondary | ICD-10-CM | POA: Diagnosis not present

## 2018-01-07 DIAGNOSIS — R69 Illness, unspecified: Secondary | ICD-10-CM | POA: Diagnosis not present

## 2018-01-07 DIAGNOSIS — G300 Alzheimer's disease with early onset: Secondary | ICD-10-CM | POA: Diagnosis not present

## 2018-01-07 DIAGNOSIS — Z950 Presence of cardiac pacemaker: Secondary | ICD-10-CM | POA: Diagnosis not present

## 2018-01-07 DIAGNOSIS — M25561 Pain in right knee: Secondary | ICD-10-CM | POA: Diagnosis not present

## 2018-01-07 DIAGNOSIS — G8911 Acute pain due to trauma: Secondary | ICD-10-CM | POA: Diagnosis not present

## 2018-01-07 DIAGNOSIS — M47892 Other spondylosis, cervical region: Secondary | ICD-10-CM | POA: Diagnosis not present

## 2018-01-07 DIAGNOSIS — B370 Candidal stomatitis: Secondary | ICD-10-CM | POA: Diagnosis not present

## 2018-01-07 DIAGNOSIS — R509 Fever, unspecified: Secondary | ICD-10-CM | POA: Diagnosis not present

## 2018-01-07 DIAGNOSIS — J129 Viral pneumonia, unspecified: Secondary | ICD-10-CM | POA: Diagnosis not present

## 2018-01-07 DIAGNOSIS — J189 Pneumonia, unspecified organism: Secondary | ICD-10-CM | POA: Diagnosis not present

## 2018-01-07 DIAGNOSIS — G4730 Sleep apnea, unspecified: Secondary | ICD-10-CM | POA: Diagnosis not present

## 2018-01-07 DIAGNOSIS — I442 Atrioventricular block, complete: Secondary | ICD-10-CM | POA: Diagnosis not present

## 2018-01-07 DIAGNOSIS — F0280 Dementia in other diseases classified elsewhere without behavioral disturbance: Secondary | ICD-10-CM | POA: Diagnosis not present

## 2018-01-07 DIAGNOSIS — Z951 Presence of aortocoronary bypass graft: Secondary | ICD-10-CM | POA: Diagnosis not present

## 2018-01-07 DIAGNOSIS — M5030 Other cervical disc degeneration, unspecified cervical region: Secondary | ICD-10-CM | POA: Diagnosis not present

## 2018-01-07 DIAGNOSIS — R930 Abnormal findings on diagnostic imaging of skull and head, not elsewhere classified: Secondary | ICD-10-CM | POA: Diagnosis not present

## 2018-01-07 DIAGNOSIS — M25562 Pain in left knee: Secondary | ICD-10-CM | POA: Diagnosis not present

## 2018-01-07 DIAGNOSIS — J181 Lobar pneumonia, unspecified organism: Secondary | ICD-10-CM | POA: Diagnosis not present

## 2018-01-07 DIAGNOSIS — M25551 Pain in right hip: Secondary | ICD-10-CM | POA: Diagnosis not present

## 2018-01-07 DIAGNOSIS — R600 Localized edema: Secondary | ICD-10-CM | POA: Diagnosis not present

## 2018-01-07 DIAGNOSIS — Z888 Allergy status to other drugs, medicaments and biological substances status: Secondary | ICD-10-CM | POA: Diagnosis not present

## 2018-01-07 DIAGNOSIS — Z87891 Personal history of nicotine dependence: Secondary | ICD-10-CM | POA: Diagnosis not present

## 2018-01-07 DIAGNOSIS — I2510 Atherosclerotic heart disease of native coronary artery without angina pectoris: Secondary | ICD-10-CM | POA: Diagnosis not present

## 2018-01-07 DIAGNOSIS — S199XXA Unspecified injury of neck, initial encounter: Secondary | ICD-10-CM | POA: Diagnosis not present

## 2018-01-07 DIAGNOSIS — J159 Unspecified bacterial pneumonia: Secondary | ICD-10-CM | POA: Diagnosis not present

## 2018-01-07 DIAGNOSIS — M25552 Pain in left hip: Secondary | ICD-10-CM | POA: Diagnosis not present

## 2018-01-07 DIAGNOSIS — R918 Other nonspecific abnormal finding of lung field: Secondary | ICD-10-CM | POA: Diagnosis not present

## 2018-01-07 DIAGNOSIS — R7989 Other specified abnormal findings of blood chemistry: Secondary | ICD-10-CM | POA: Diagnosis not present

## 2018-01-08 DIAGNOSIS — R69 Illness, unspecified: Secondary | ICD-10-CM | POA: Diagnosis not present

## 2018-01-25 DIAGNOSIS — I10 Essential (primary) hypertension: Secondary | ICD-10-CM | POA: Diagnosis not present

## 2018-01-25 DIAGNOSIS — L89311 Pressure ulcer of right buttock, stage 1: Secondary | ICD-10-CM | POA: Diagnosis not present

## 2018-01-25 DIAGNOSIS — I442 Atrioventricular block, complete: Secondary | ICD-10-CM | POA: Diagnosis not present

## 2018-01-25 DIAGNOSIS — F0280 Dementia in other diseases classified elsewhere without behavioral disturbance: Secondary | ICD-10-CM | POA: Diagnosis not present

## 2018-01-25 DIAGNOSIS — K219 Gastro-esophageal reflux disease without esophagitis: Secondary | ICD-10-CM | POA: Diagnosis not present

## 2018-01-25 DIAGNOSIS — G300 Alzheimer's disease with early onset: Secondary | ICD-10-CM | POA: Diagnosis not present

## 2018-01-25 DIAGNOSIS — I2510 Atherosclerotic heart disease of native coronary artery without angina pectoris: Secondary | ICD-10-CM | POA: Diagnosis not present

## 2018-01-25 DIAGNOSIS — L89321 Pressure ulcer of left buttock, stage 1: Secondary | ICD-10-CM | POA: Diagnosis not present

## 2018-01-25 DIAGNOSIS — E785 Hyperlipidemia, unspecified: Secondary | ICD-10-CM | POA: Diagnosis not present

## 2018-01-28 DIAGNOSIS — I442 Atrioventricular block, complete: Secondary | ICD-10-CM | POA: Diagnosis not present

## 2018-01-28 DIAGNOSIS — L89321 Pressure ulcer of left buttock, stage 1: Secondary | ICD-10-CM | POA: Diagnosis not present

## 2018-01-28 DIAGNOSIS — G300 Alzheimer's disease with early onset: Secondary | ICD-10-CM | POA: Diagnosis not present

## 2018-01-28 DIAGNOSIS — I2510 Atherosclerotic heart disease of native coronary artery without angina pectoris: Secondary | ICD-10-CM | POA: Diagnosis not present

## 2018-01-28 DIAGNOSIS — L89311 Pressure ulcer of right buttock, stage 1: Secondary | ICD-10-CM | POA: Diagnosis not present

## 2018-01-28 DIAGNOSIS — K219 Gastro-esophageal reflux disease without esophagitis: Secondary | ICD-10-CM | POA: Diagnosis not present

## 2018-01-28 DIAGNOSIS — I10 Essential (primary) hypertension: Secondary | ICD-10-CM | POA: Diagnosis not present

## 2018-01-28 DIAGNOSIS — E785 Hyperlipidemia, unspecified: Secondary | ICD-10-CM | POA: Diagnosis not present

## 2018-01-28 DIAGNOSIS — F0280 Dementia in other diseases classified elsewhere without behavioral disturbance: Secondary | ICD-10-CM | POA: Diagnosis not present

## 2018-01-29 DIAGNOSIS — L89321 Pressure ulcer of left buttock, stage 1: Secondary | ICD-10-CM | POA: Diagnosis not present

## 2018-01-29 DIAGNOSIS — I2510 Atherosclerotic heart disease of native coronary artery without angina pectoris: Secondary | ICD-10-CM | POA: Diagnosis not present

## 2018-01-29 DIAGNOSIS — K219 Gastro-esophageal reflux disease without esophagitis: Secondary | ICD-10-CM | POA: Diagnosis not present

## 2018-01-29 DIAGNOSIS — F0280 Dementia in other diseases classified elsewhere without behavioral disturbance: Secondary | ICD-10-CM | POA: Diagnosis not present

## 2018-01-29 DIAGNOSIS — E785 Hyperlipidemia, unspecified: Secondary | ICD-10-CM | POA: Diagnosis not present

## 2018-01-29 DIAGNOSIS — G300 Alzheimer's disease with early onset: Secondary | ICD-10-CM | POA: Diagnosis not present

## 2018-01-29 DIAGNOSIS — L89311 Pressure ulcer of right buttock, stage 1: Secondary | ICD-10-CM | POA: Diagnosis not present

## 2018-01-29 DIAGNOSIS — I10 Essential (primary) hypertension: Secondary | ICD-10-CM | POA: Diagnosis not present

## 2018-01-29 DIAGNOSIS — I442 Atrioventricular block, complete: Secondary | ICD-10-CM | POA: Diagnosis not present

## 2018-02-05 DIAGNOSIS — G300 Alzheimer's disease with early onset: Secondary | ICD-10-CM | POA: Diagnosis not present

## 2018-02-05 DIAGNOSIS — L89311 Pressure ulcer of right buttock, stage 1: Secondary | ICD-10-CM | POA: Diagnosis not present

## 2018-02-05 DIAGNOSIS — I2510 Atherosclerotic heart disease of native coronary artery without angina pectoris: Secondary | ICD-10-CM | POA: Diagnosis not present

## 2018-02-05 DIAGNOSIS — E785 Hyperlipidemia, unspecified: Secondary | ICD-10-CM | POA: Diagnosis not present

## 2018-02-05 DIAGNOSIS — I442 Atrioventricular block, complete: Secondary | ICD-10-CM | POA: Diagnosis not present

## 2018-02-05 DIAGNOSIS — I10 Essential (primary) hypertension: Secondary | ICD-10-CM | POA: Diagnosis not present

## 2018-02-05 DIAGNOSIS — F0280 Dementia in other diseases classified elsewhere without behavioral disturbance: Secondary | ICD-10-CM | POA: Diagnosis not present

## 2018-02-05 DIAGNOSIS — L89321 Pressure ulcer of left buttock, stage 1: Secondary | ICD-10-CM | POA: Diagnosis not present

## 2018-02-05 DIAGNOSIS — K219 Gastro-esophageal reflux disease without esophagitis: Secondary | ICD-10-CM | POA: Diagnosis not present

## 2018-02-07 DIAGNOSIS — I2510 Atherosclerotic heart disease of native coronary artery without angina pectoris: Secondary | ICD-10-CM | POA: Diagnosis not present

## 2018-02-07 DIAGNOSIS — L89311 Pressure ulcer of right buttock, stage 1: Secondary | ICD-10-CM | POA: Diagnosis not present

## 2018-02-07 DIAGNOSIS — F0280 Dementia in other diseases classified elsewhere without behavioral disturbance: Secondary | ICD-10-CM | POA: Diagnosis not present

## 2018-02-07 DIAGNOSIS — I442 Atrioventricular block, complete: Secondary | ICD-10-CM | POA: Diagnosis not present

## 2018-02-07 DIAGNOSIS — L89321 Pressure ulcer of left buttock, stage 1: Secondary | ICD-10-CM | POA: Diagnosis not present

## 2018-02-07 DIAGNOSIS — K219 Gastro-esophageal reflux disease without esophagitis: Secondary | ICD-10-CM | POA: Diagnosis not present

## 2018-02-07 DIAGNOSIS — G300 Alzheimer's disease with early onset: Secondary | ICD-10-CM | POA: Diagnosis not present

## 2018-02-07 DIAGNOSIS — I10 Essential (primary) hypertension: Secondary | ICD-10-CM | POA: Diagnosis not present

## 2018-02-07 DIAGNOSIS — E785 Hyperlipidemia, unspecified: Secondary | ICD-10-CM | POA: Diagnosis not present

## 2018-02-12 DIAGNOSIS — F0280 Dementia in other diseases classified elsewhere without behavioral disturbance: Secondary | ICD-10-CM | POA: Diagnosis not present

## 2018-02-12 DIAGNOSIS — K219 Gastro-esophageal reflux disease without esophagitis: Secondary | ICD-10-CM | POA: Diagnosis not present

## 2018-02-12 DIAGNOSIS — I442 Atrioventricular block, complete: Secondary | ICD-10-CM | POA: Diagnosis not present

## 2018-02-12 DIAGNOSIS — I2510 Atherosclerotic heart disease of native coronary artery without angina pectoris: Secondary | ICD-10-CM | POA: Diagnosis not present

## 2018-02-12 DIAGNOSIS — G300 Alzheimer's disease with early onset: Secondary | ICD-10-CM | POA: Diagnosis not present

## 2018-02-12 DIAGNOSIS — L89311 Pressure ulcer of right buttock, stage 1: Secondary | ICD-10-CM | POA: Diagnosis not present

## 2018-02-12 DIAGNOSIS — I10 Essential (primary) hypertension: Secondary | ICD-10-CM | POA: Diagnosis not present

## 2018-02-12 DIAGNOSIS — E785 Hyperlipidemia, unspecified: Secondary | ICD-10-CM | POA: Diagnosis not present

## 2018-02-12 DIAGNOSIS — L89321 Pressure ulcer of left buttock, stage 1: Secondary | ICD-10-CM | POA: Diagnosis not present

## 2018-02-14 DIAGNOSIS — F0280 Dementia in other diseases classified elsewhere without behavioral disturbance: Secondary | ICD-10-CM | POA: Diagnosis not present

## 2018-02-14 DIAGNOSIS — I2510 Atherosclerotic heart disease of native coronary artery without angina pectoris: Secondary | ICD-10-CM | POA: Diagnosis not present

## 2018-02-14 DIAGNOSIS — I10 Essential (primary) hypertension: Secondary | ICD-10-CM | POA: Diagnosis not present

## 2018-02-14 DIAGNOSIS — L89311 Pressure ulcer of right buttock, stage 1: Secondary | ICD-10-CM | POA: Diagnosis not present

## 2018-02-14 DIAGNOSIS — K219 Gastro-esophageal reflux disease without esophagitis: Secondary | ICD-10-CM | POA: Diagnosis not present

## 2018-02-14 DIAGNOSIS — G300 Alzheimer's disease with early onset: Secondary | ICD-10-CM | POA: Diagnosis not present

## 2018-02-14 DIAGNOSIS — L89321 Pressure ulcer of left buttock, stage 1: Secondary | ICD-10-CM | POA: Diagnosis not present

## 2018-02-14 DIAGNOSIS — I442 Atrioventricular block, complete: Secondary | ICD-10-CM | POA: Diagnosis not present

## 2018-02-14 DIAGNOSIS — E785 Hyperlipidemia, unspecified: Secondary | ICD-10-CM | POA: Diagnosis not present

## 2018-02-18 DIAGNOSIS — I442 Atrioventricular block, complete: Secondary | ICD-10-CM | POA: Diagnosis not present

## 2018-02-18 DIAGNOSIS — L89311 Pressure ulcer of right buttock, stage 1: Secondary | ICD-10-CM | POA: Diagnosis not present

## 2018-02-18 DIAGNOSIS — I10 Essential (primary) hypertension: Secondary | ICD-10-CM | POA: Diagnosis not present

## 2018-02-18 DIAGNOSIS — G300 Alzheimer's disease with early onset: Secondary | ICD-10-CM | POA: Diagnosis not present

## 2018-02-18 DIAGNOSIS — I2510 Atherosclerotic heart disease of native coronary artery without angina pectoris: Secondary | ICD-10-CM | POA: Diagnosis not present

## 2018-02-18 DIAGNOSIS — E785 Hyperlipidemia, unspecified: Secondary | ICD-10-CM | POA: Diagnosis not present

## 2018-02-18 DIAGNOSIS — K219 Gastro-esophageal reflux disease without esophagitis: Secondary | ICD-10-CM | POA: Diagnosis not present

## 2018-02-18 DIAGNOSIS — L89321 Pressure ulcer of left buttock, stage 1: Secondary | ICD-10-CM | POA: Diagnosis not present

## 2018-02-18 DIAGNOSIS — F0280 Dementia in other diseases classified elsewhere without behavioral disturbance: Secondary | ICD-10-CM | POA: Diagnosis not present

## 2018-02-23 DIAGNOSIS — G300 Alzheimer's disease with early onset: Secondary | ICD-10-CM | POA: Diagnosis not present

## 2018-02-23 DIAGNOSIS — I10 Essential (primary) hypertension: Secondary | ICD-10-CM | POA: Diagnosis not present

## 2018-02-23 DIAGNOSIS — I2510 Atherosclerotic heart disease of native coronary artery without angina pectoris: Secondary | ICD-10-CM | POA: Diagnosis not present

## 2018-02-23 DIAGNOSIS — F0280 Dementia in other diseases classified elsewhere without behavioral disturbance: Secondary | ICD-10-CM | POA: Diagnosis not present

## 2018-02-23 DIAGNOSIS — K219 Gastro-esophageal reflux disease without esophagitis: Secondary | ICD-10-CM | POA: Diagnosis not present

## 2018-02-23 DIAGNOSIS — L89311 Pressure ulcer of right buttock, stage 1: Secondary | ICD-10-CM | POA: Diagnosis not present

## 2018-02-23 DIAGNOSIS — I442 Atrioventricular block, complete: Secondary | ICD-10-CM | POA: Diagnosis not present

## 2018-02-23 DIAGNOSIS — E785 Hyperlipidemia, unspecified: Secondary | ICD-10-CM | POA: Diagnosis not present

## 2018-02-23 DIAGNOSIS — L89321 Pressure ulcer of left buttock, stage 1: Secondary | ICD-10-CM | POA: Diagnosis not present

## 2018-03-05 DIAGNOSIS — R110 Nausea: Secondary | ICD-10-CM | POA: Diagnosis not present

## 2018-03-05 DIAGNOSIS — F064 Anxiety disorder due to known physiological condition: Secondary | ICD-10-CM | POA: Diagnosis not present

## 2018-03-05 DIAGNOSIS — R269 Unspecified abnormalities of gait and mobility: Secondary | ICD-10-CM | POA: Diagnosis not present

## 2018-03-05 DIAGNOSIS — F0281 Dementia in other diseases classified elsewhere with behavioral disturbance: Secondary | ICD-10-CM | POA: Diagnosis not present

## 2018-03-05 DIAGNOSIS — M6281 Muscle weakness (generalized): Secondary | ICD-10-CM | POA: Diagnosis not present

## 2018-03-05 DIAGNOSIS — K219 Gastro-esophageal reflux disease without esophagitis: Secondary | ICD-10-CM | POA: Diagnosis not present

## 2018-03-05 DIAGNOSIS — F5105 Insomnia due to other mental disorder: Secondary | ICD-10-CM | POA: Diagnosis not present

## 2018-03-05 DIAGNOSIS — L89301 Pressure ulcer of unspecified buttock, stage 1: Secondary | ICD-10-CM | POA: Diagnosis not present

## 2018-03-05 DIAGNOSIS — I10 Essential (primary) hypertension: Secondary | ICD-10-CM | POA: Diagnosis not present

## 2018-03-05 DIAGNOSIS — G300 Alzheimer's disease with early onset: Secondary | ICD-10-CM | POA: Diagnosis not present

## 2018-03-05 DIAGNOSIS — M150 Primary generalized (osteo)arthritis: Secondary | ICD-10-CM | POA: Diagnosis not present

## 2018-03-05 DIAGNOSIS — G9009 Other idiopathic peripheral autonomic neuropathy: Secondary | ICD-10-CM | POA: Diagnosis not present

## 2018-03-05 DIAGNOSIS — L309 Dermatitis, unspecified: Secondary | ICD-10-CM | POA: Diagnosis not present

## 2018-03-12 DIAGNOSIS — H353131 Nonexudative age-related macular degeneration, bilateral, early dry stage: Secondary | ICD-10-CM | POA: Diagnosis not present

## 2018-03-23 DIAGNOSIS — F4312 Post-traumatic stress disorder, chronic: Secondary | ICD-10-CM | POA: Diagnosis not present

## 2018-03-23 DIAGNOSIS — F250 Schizoaffective disorder, bipolar type: Secondary | ICD-10-CM | POA: Diagnosis not present

## 2018-04-02 DIAGNOSIS — R269 Unspecified abnormalities of gait and mobility: Secondary | ICD-10-CM | POA: Diagnosis not present

## 2018-04-02 DIAGNOSIS — M6281 Muscle weakness (generalized): Secondary | ICD-10-CM | POA: Diagnosis not present

## 2018-04-02 DIAGNOSIS — I10 Essential (primary) hypertension: Secondary | ICD-10-CM | POA: Diagnosis not present

## 2018-04-02 DIAGNOSIS — L309 Dermatitis, unspecified: Secondary | ICD-10-CM | POA: Diagnosis not present

## 2018-04-02 DIAGNOSIS — G9009 Other idiopathic peripheral autonomic neuropathy: Secondary | ICD-10-CM | POA: Diagnosis not present

## 2018-04-02 DIAGNOSIS — L89301 Pressure ulcer of unspecified buttock, stage 1: Secondary | ICD-10-CM | POA: Diagnosis not present

## 2018-04-02 DIAGNOSIS — R110 Nausea: Secondary | ICD-10-CM | POA: Diagnosis not present

## 2018-04-02 DIAGNOSIS — K219 Gastro-esophageal reflux disease without esophagitis: Secondary | ICD-10-CM | POA: Diagnosis not present

## 2018-04-02 DIAGNOSIS — M150 Primary generalized (osteo)arthritis: Secondary | ICD-10-CM | POA: Diagnosis not present

## 2018-04-23 DIAGNOSIS — F5105 Insomnia due to other mental disorder: Secondary | ICD-10-CM | POA: Diagnosis not present

## 2018-04-23 DIAGNOSIS — F0281 Dementia in other diseases classified elsewhere with behavioral disturbance: Secondary | ICD-10-CM | POA: Diagnosis not present

## 2018-04-23 DIAGNOSIS — G300 Alzheimer's disease with early onset: Secondary | ICD-10-CM | POA: Diagnosis not present

## 2018-04-23 DIAGNOSIS — F064 Anxiety disorder due to known physiological condition: Secondary | ICD-10-CM | POA: Diagnosis not present

## 2018-05-07 DIAGNOSIS — I10 Essential (primary) hypertension: Secondary | ICD-10-CM | POA: Diagnosis not present

## 2018-05-07 DIAGNOSIS — M6281 Muscle weakness (generalized): Secondary | ICD-10-CM | POA: Diagnosis not present

## 2018-05-07 DIAGNOSIS — L309 Dermatitis, unspecified: Secondary | ICD-10-CM | POA: Diagnosis not present

## 2018-05-07 DIAGNOSIS — R269 Unspecified abnormalities of gait and mobility: Secondary | ICD-10-CM | POA: Diagnosis not present

## 2018-05-07 DIAGNOSIS — L89301 Pressure ulcer of unspecified buttock, stage 1: Secondary | ICD-10-CM | POA: Diagnosis not present

## 2018-05-07 DIAGNOSIS — M150 Primary generalized (osteo)arthritis: Secondary | ICD-10-CM | POA: Diagnosis not present

## 2018-05-07 DIAGNOSIS — K219 Gastro-esophageal reflux disease without esophagitis: Secondary | ICD-10-CM | POA: Diagnosis not present

## 2018-05-07 DIAGNOSIS — R110 Nausea: Secondary | ICD-10-CM | POA: Diagnosis not present

## 2018-05-07 DIAGNOSIS — G9009 Other idiopathic peripheral autonomic neuropathy: Secondary | ICD-10-CM | POA: Diagnosis not present

## 2018-05-14 DIAGNOSIS — B351 Tinea unguium: Secondary | ICD-10-CM | POA: Diagnosis not present

## 2018-05-14 DIAGNOSIS — I739 Peripheral vascular disease, unspecified: Secondary | ICD-10-CM | POA: Diagnosis not present

## 2018-05-14 DIAGNOSIS — L84 Corns and callosities: Secondary | ICD-10-CM | POA: Diagnosis not present

## 2018-05-14 DIAGNOSIS — Q845 Enlarged and hypertrophic nails: Secondary | ICD-10-CM | POA: Diagnosis not present

## 2018-05-14 DIAGNOSIS — L603 Nail dystrophy: Secondary | ICD-10-CM | POA: Diagnosis not present

## 2018-05-28 DIAGNOSIS — G300 Alzheimer's disease with early onset: Secondary | ICD-10-CM | POA: Diagnosis not present

## 2018-05-28 DIAGNOSIS — F064 Anxiety disorder due to known physiological condition: Secondary | ICD-10-CM | POA: Diagnosis not present

## 2018-05-28 DIAGNOSIS — F0281 Dementia in other diseases classified elsewhere with behavioral disturbance: Secondary | ICD-10-CM | POA: Diagnosis not present

## 2018-05-28 DIAGNOSIS — F5105 Insomnia due to other mental disorder: Secondary | ICD-10-CM | POA: Diagnosis not present

## 2018-06-04 DIAGNOSIS — M150 Primary generalized (osteo)arthritis: Secondary | ICD-10-CM | POA: Diagnosis not present

## 2018-06-04 DIAGNOSIS — I10 Essential (primary) hypertension: Secondary | ICD-10-CM | POA: Diagnosis not present

## 2018-06-04 DIAGNOSIS — M6281 Muscle weakness (generalized): Secondary | ICD-10-CM | POA: Diagnosis not present

## 2018-06-04 DIAGNOSIS — R110 Nausea: Secondary | ICD-10-CM | POA: Diagnosis not present

## 2018-06-04 DIAGNOSIS — K219 Gastro-esophageal reflux disease without esophagitis: Secondary | ICD-10-CM | POA: Diagnosis not present

## 2018-06-04 DIAGNOSIS — G9009 Other idiopathic peripheral autonomic neuropathy: Secondary | ICD-10-CM | POA: Diagnosis not present

## 2018-06-04 DIAGNOSIS — L89301 Pressure ulcer of unspecified buttock, stage 1: Secondary | ICD-10-CM | POA: Diagnosis not present

## 2018-06-04 DIAGNOSIS — L309 Dermatitis, unspecified: Secondary | ICD-10-CM | POA: Diagnosis not present

## 2018-06-04 DIAGNOSIS — R269 Unspecified abnormalities of gait and mobility: Secondary | ICD-10-CM | POA: Diagnosis not present

## 2018-06-11 DIAGNOSIS — F5105 Insomnia due to other mental disorder: Secondary | ICD-10-CM | POA: Diagnosis not present

## 2018-06-11 DIAGNOSIS — G300 Alzheimer's disease with early onset: Secondary | ICD-10-CM | POA: Diagnosis not present

## 2018-06-11 DIAGNOSIS — F0281 Dementia in other diseases classified elsewhere with behavioral disturbance: Secondary | ICD-10-CM | POA: Diagnosis not present

## 2018-06-11 DIAGNOSIS — F064 Anxiety disorder due to known physiological condition: Secondary | ICD-10-CM | POA: Diagnosis not present

## 2018-06-25 ENCOUNTER — Telehealth: Payer: Self-pay | Admitting: Cardiology

## 2018-06-25 NOTE — Telephone Encounter (Signed)
Spoke w/ pt wife. She informed me that pt has Alzheimer's disease really bad and is in a facility.  She stated that pt PCP said there was no need to follow PPM.

## 2018-07-02 DIAGNOSIS — K219 Gastro-esophageal reflux disease without esophagitis: Secondary | ICD-10-CM | POA: Diagnosis not present

## 2018-07-02 DIAGNOSIS — I10 Essential (primary) hypertension: Secondary | ICD-10-CM | POA: Diagnosis not present

## 2018-07-02 DIAGNOSIS — M6281 Muscle weakness (generalized): Secondary | ICD-10-CM | POA: Diagnosis not present

## 2018-07-02 DIAGNOSIS — L309 Dermatitis, unspecified: Secondary | ICD-10-CM | POA: Diagnosis not present

## 2018-07-02 DIAGNOSIS — G9009 Other idiopathic peripheral autonomic neuropathy: Secondary | ICD-10-CM | POA: Diagnosis not present

## 2018-07-02 DIAGNOSIS — R269 Unspecified abnormalities of gait and mobility: Secondary | ICD-10-CM | POA: Diagnosis not present

## 2018-07-02 DIAGNOSIS — M150 Primary generalized (osteo)arthritis: Secondary | ICD-10-CM | POA: Diagnosis not present

## 2018-07-02 DIAGNOSIS — L89301 Pressure ulcer of unspecified buttock, stage 1: Secondary | ICD-10-CM | POA: Diagnosis not present

## 2018-07-02 DIAGNOSIS — R110 Nausea: Secondary | ICD-10-CM | POA: Diagnosis not present

## 2018-07-05 DIAGNOSIS — R05 Cough: Secondary | ICD-10-CM | POA: Diagnosis not present

## 2018-07-09 DIAGNOSIS — F5105 Insomnia due to other mental disorder: Secondary | ICD-10-CM | POA: Diagnosis not present

## 2018-07-09 DIAGNOSIS — F0281 Dementia in other diseases classified elsewhere with behavioral disturbance: Secondary | ICD-10-CM | POA: Diagnosis not present

## 2018-07-09 DIAGNOSIS — G300 Alzheimer's disease with early onset: Secondary | ICD-10-CM | POA: Diagnosis not present

## 2018-07-09 DIAGNOSIS — F064 Anxiety disorder due to known physiological condition: Secondary | ICD-10-CM | POA: Diagnosis not present

## 2018-07-16 DIAGNOSIS — G9009 Other idiopathic peripheral autonomic neuropathy: Secondary | ICD-10-CM | POA: Diagnosis not present

## 2018-07-16 DIAGNOSIS — G894 Chronic pain syndrome: Secondary | ICD-10-CM | POA: Diagnosis not present

## 2018-07-30 DIAGNOSIS — L89301 Pressure ulcer of unspecified buttock, stage 1: Secondary | ICD-10-CM | POA: Diagnosis not present

## 2018-07-30 DIAGNOSIS — G9009 Other idiopathic peripheral autonomic neuropathy: Secondary | ICD-10-CM | POA: Diagnosis not present

## 2018-07-30 DIAGNOSIS — M150 Primary generalized (osteo)arthritis: Secondary | ICD-10-CM | POA: Diagnosis not present

## 2018-07-30 DIAGNOSIS — R110 Nausea: Secondary | ICD-10-CM | POA: Diagnosis not present

## 2018-07-30 DIAGNOSIS — I10 Essential (primary) hypertension: Secondary | ICD-10-CM | POA: Diagnosis not present

## 2018-07-30 DIAGNOSIS — K219 Gastro-esophageal reflux disease without esophagitis: Secondary | ICD-10-CM | POA: Diagnosis not present

## 2018-07-30 DIAGNOSIS — R269 Unspecified abnormalities of gait and mobility: Secondary | ICD-10-CM | POA: Diagnosis not present

## 2018-07-30 DIAGNOSIS — M6281 Muscle weakness (generalized): Secondary | ICD-10-CM | POA: Diagnosis not present

## 2018-07-30 DIAGNOSIS — L309 Dermatitis, unspecified: Secondary | ICD-10-CM | POA: Diagnosis not present

## 2018-08-13 DIAGNOSIS — G9009 Other idiopathic peripheral autonomic neuropathy: Secondary | ICD-10-CM | POA: Diagnosis not present

## 2018-08-27 DIAGNOSIS — G300 Alzheimer's disease with early onset: Secondary | ICD-10-CM | POA: Diagnosis not present

## 2018-08-27 DIAGNOSIS — R269 Unspecified abnormalities of gait and mobility: Secondary | ICD-10-CM | POA: Diagnosis not present

## 2018-08-27 DIAGNOSIS — K219 Gastro-esophageal reflux disease without esophagitis: Secondary | ICD-10-CM | POA: Diagnosis not present

## 2018-08-27 DIAGNOSIS — F064 Anxiety disorder due to known physiological condition: Secondary | ICD-10-CM | POA: Diagnosis not present

## 2018-08-27 DIAGNOSIS — F0281 Dementia in other diseases classified elsewhere with behavioral disturbance: Secondary | ICD-10-CM | POA: Diagnosis not present

## 2018-08-27 DIAGNOSIS — F5105 Insomnia due to other mental disorder: Secondary | ICD-10-CM | POA: Diagnosis not present

## 2018-08-27 DIAGNOSIS — I10 Essential (primary) hypertension: Secondary | ICD-10-CM | POA: Diagnosis not present

## 2018-09-16 DIAGNOSIS — Z03818 Encounter for observation for suspected exposure to other biological agents ruled out: Secondary | ICD-10-CM | POA: Diagnosis not present

## 2018-09-17 DIAGNOSIS — G9009 Other idiopathic peripheral autonomic neuropathy: Secondary | ICD-10-CM | POA: Diagnosis not present

## 2018-09-24 DIAGNOSIS — F0281 Dementia in other diseases classified elsewhere with behavioral disturbance: Secondary | ICD-10-CM | POA: Diagnosis not present

## 2018-09-24 DIAGNOSIS — F064 Anxiety disorder due to known physiological condition: Secondary | ICD-10-CM | POA: Diagnosis not present

## 2018-09-24 DIAGNOSIS — F5105 Insomnia due to other mental disorder: Secondary | ICD-10-CM | POA: Diagnosis not present

## 2018-09-24 DIAGNOSIS — G300 Alzheimer's disease with early onset: Secondary | ICD-10-CM | POA: Diagnosis not present

## 2018-10-01 DIAGNOSIS — M1991 Primary osteoarthritis, unspecified site: Secondary | ICD-10-CM | POA: Diagnosis not present

## 2018-10-01 DIAGNOSIS — H353131 Nonexudative age-related macular degeneration, bilateral, early dry stage: Secondary | ICD-10-CM | POA: Diagnosis not present

## 2018-10-01 DIAGNOSIS — G9009 Other idiopathic peripheral autonomic neuropathy: Secondary | ICD-10-CM | POA: Diagnosis not present

## 2018-10-01 DIAGNOSIS — M6281 Muscle weakness (generalized): Secondary | ICD-10-CM | POA: Diagnosis not present

## 2018-10-15 DIAGNOSIS — G9009 Other idiopathic peripheral autonomic neuropathy: Secondary | ICD-10-CM | POA: Diagnosis not present

## 2018-10-22 DIAGNOSIS — F5105 Insomnia due to other mental disorder: Secondary | ICD-10-CM | POA: Diagnosis not present

## 2018-10-22 DIAGNOSIS — G300 Alzheimer's disease with early onset: Secondary | ICD-10-CM | POA: Diagnosis not present

## 2018-10-22 DIAGNOSIS — F0281 Dementia in other diseases classified elsewhere with behavioral disturbance: Secondary | ICD-10-CM | POA: Diagnosis not present

## 2018-10-22 DIAGNOSIS — F064 Anxiety disorder due to known physiological condition: Secondary | ICD-10-CM | POA: Diagnosis not present

## 2018-10-29 DIAGNOSIS — L89301 Pressure ulcer of unspecified buttock, stage 1: Secondary | ICD-10-CM | POA: Diagnosis not present

## 2018-10-29 DIAGNOSIS — K219 Gastro-esophageal reflux disease without esophagitis: Secondary | ICD-10-CM | POA: Diagnosis not present

## 2018-10-29 DIAGNOSIS — I10 Essential (primary) hypertension: Secondary | ICD-10-CM | POA: Diagnosis not present

## 2018-11-04 DIAGNOSIS — K219 Gastro-esophageal reflux disease without esophagitis: Secondary | ICD-10-CM | POA: Diagnosis not present

## 2018-11-04 DIAGNOSIS — I10 Essential (primary) hypertension: Secondary | ICD-10-CM | POA: Diagnosis not present

## 2018-11-04 DIAGNOSIS — R269 Unspecified abnormalities of gait and mobility: Secondary | ICD-10-CM | POA: Diagnosis not present

## 2018-11-05 DIAGNOSIS — F5105 Insomnia due to other mental disorder: Secondary | ICD-10-CM | POA: Diagnosis not present

## 2018-11-05 DIAGNOSIS — F064 Anxiety disorder due to known physiological condition: Secondary | ICD-10-CM | POA: Diagnosis not present

## 2018-11-05 DIAGNOSIS — F0281 Dementia in other diseases classified elsewhere with behavioral disturbance: Secondary | ICD-10-CM | POA: Diagnosis not present

## 2018-11-05 DIAGNOSIS — G300 Alzheimer's disease with early onset: Secondary | ICD-10-CM | POA: Diagnosis not present

## 2018-11-12 DIAGNOSIS — G9009 Other idiopathic peripheral autonomic neuropathy: Secondary | ICD-10-CM | POA: Diagnosis not present

## 2018-11-19 DIAGNOSIS — F0281 Dementia in other diseases classified elsewhere with behavioral disturbance: Secondary | ICD-10-CM | POA: Diagnosis not present

## 2018-11-19 DIAGNOSIS — E782 Mixed hyperlipidemia: Secondary | ICD-10-CM | POA: Diagnosis not present

## 2018-11-19 DIAGNOSIS — G300 Alzheimer's disease with early onset: Secondary | ICD-10-CM | POA: Diagnosis not present

## 2018-11-19 DIAGNOSIS — F5105 Insomnia due to other mental disorder: Secondary | ICD-10-CM | POA: Diagnosis not present

## 2018-11-19 DIAGNOSIS — F064 Anxiety disorder due to known physiological condition: Secondary | ICD-10-CM | POA: Diagnosis not present

## 2018-11-19 DIAGNOSIS — E039 Hypothyroidism, unspecified: Secondary | ICD-10-CM | POA: Diagnosis not present

## 2018-11-19 DIAGNOSIS — I10 Essential (primary) hypertension: Secondary | ICD-10-CM | POA: Diagnosis not present

## 2018-11-26 DIAGNOSIS — G9009 Other idiopathic peripheral autonomic neuropathy: Secondary | ICD-10-CM | POA: Diagnosis not present

## 2018-11-26 DIAGNOSIS — M6281 Muscle weakness (generalized): Secondary | ICD-10-CM | POA: Diagnosis not present

## 2018-11-26 DIAGNOSIS — M1909 Primary osteoarthritis, other specified site: Secondary | ICD-10-CM | POA: Diagnosis not present

## 2018-12-03 DIAGNOSIS — Z79899 Other long term (current) drug therapy: Secondary | ICD-10-CM | POA: Diagnosis not present

## 2018-12-03 DIAGNOSIS — N390 Urinary tract infection, site not specified: Secondary | ICD-10-CM | POA: Diagnosis not present

## 2018-12-10 DIAGNOSIS — G9009 Other idiopathic peripheral autonomic neuropathy: Secondary | ICD-10-CM | POA: Diagnosis not present

## 2018-12-20 DIAGNOSIS — R05 Cough: Secondary | ICD-10-CM | POA: Diagnosis not present

## 2018-12-20 DIAGNOSIS — J9601 Acute respiratory failure with hypoxia: Secondary | ICD-10-CM | POA: Diagnosis not present

## 2018-12-20 DIAGNOSIS — I10 Essential (primary) hypertension: Secondary | ICD-10-CM | POA: Diagnosis not present

## 2018-12-20 DIAGNOSIS — G309 Alzheimer's disease, unspecified: Secondary | ICD-10-CM | POA: Diagnosis not present

## 2018-12-20 DIAGNOSIS — F418 Other specified anxiety disorders: Secondary | ICD-10-CM | POA: Diagnosis not present

## 2018-12-20 DIAGNOSIS — J158 Pneumonia due to other specified bacteria: Secondary | ICD-10-CM | POA: Diagnosis not present

## 2018-12-20 DIAGNOSIS — R918 Other nonspecific abnormal finding of lung field: Secondary | ICD-10-CM | POA: Diagnosis not present

## 2018-12-20 DIAGNOSIS — F0281 Dementia in other diseases classified elsewhere with behavioral disturbance: Secondary | ICD-10-CM | POA: Diagnosis not present

## 2018-12-20 DIAGNOSIS — U071 COVID-19: Secondary | ICD-10-CM | POA: Diagnosis not present

## 2018-12-20 DIAGNOSIS — R0902 Hypoxemia: Secondary | ICD-10-CM | POA: Diagnosis not present

## 2018-12-20 DIAGNOSIS — Z66 Do not resuscitate: Secondary | ICD-10-CM | POA: Diagnosis not present

## 2018-12-20 DIAGNOSIS — Z951 Presence of aortocoronary bypass graft: Secondary | ICD-10-CM | POA: Diagnosis not present

## 2018-12-20 DIAGNOSIS — K219 Gastro-esophageal reflux disease without esophagitis: Secondary | ICD-10-CM | POA: Diagnosis not present

## 2018-12-20 DIAGNOSIS — Z515 Encounter for palliative care: Secondary | ICD-10-CM | POA: Diagnosis not present

## 2018-12-24 DIAGNOSIS — F418 Other specified anxiety disorders: Secondary | ICD-10-CM | POA: Diagnosis not present

## 2018-12-24 DIAGNOSIS — R0602 Shortness of breath: Secondary | ICD-10-CM | POA: Diagnosis not present

## 2018-12-24 DIAGNOSIS — Z515 Encounter for palliative care: Secondary | ICD-10-CM | POA: Diagnosis not present

## 2018-12-24 DIAGNOSIS — G4730 Sleep apnea, unspecified: Secondary | ICD-10-CM | POA: Diagnosis not present

## 2018-12-24 DIAGNOSIS — Z66 Do not resuscitate: Secondary | ICD-10-CM | POA: Diagnosis not present

## 2018-12-24 DIAGNOSIS — M2550 Pain in unspecified joint: Secondary | ICD-10-CM | POA: Diagnosis not present

## 2018-12-24 DIAGNOSIS — F0280 Dementia in other diseases classified elsewhere without behavioral disturbance: Secondary | ICD-10-CM | POA: Diagnosis not present

## 2018-12-24 DIAGNOSIS — I4510 Unspecified right bundle-branch block: Secondary | ICD-10-CM | POA: Diagnosis not present

## 2018-12-24 DIAGNOSIS — I2510 Atherosclerotic heart disease of native coronary artery without angina pectoris: Secondary | ICD-10-CM | POA: Diagnosis not present

## 2018-12-24 DIAGNOSIS — I10 Essential (primary) hypertension: Secondary | ICD-10-CM | POA: Diagnosis not present

## 2018-12-24 DIAGNOSIS — R0600 Dyspnea, unspecified: Secondary | ICD-10-CM | POA: Diagnosis not present

## 2018-12-24 DIAGNOSIS — K219 Gastro-esophageal reflux disease without esophagitis: Secondary | ICD-10-CM | POA: Diagnosis not present

## 2018-12-24 DIAGNOSIS — Z7401 Bed confinement status: Secondary | ICD-10-CM | POA: Diagnosis not present

## 2018-12-24 DIAGNOSIS — Z7189 Other specified counseling: Secondary | ICD-10-CM | POA: Diagnosis not present

## 2018-12-24 DIAGNOSIS — G300 Alzheimer's disease with early onset: Secondary | ICD-10-CM | POA: Diagnosis not present

## 2018-12-24 DIAGNOSIS — F5105 Insomnia due to other mental disorder: Secondary | ICD-10-CM | POA: Diagnosis not present

## 2018-12-24 DIAGNOSIS — G309 Alzheimer's disease, unspecified: Secondary | ICD-10-CM | POA: Diagnosis not present

## 2018-12-24 DIAGNOSIS — F0281 Dementia in other diseases classified elsewhere with behavioral disturbance: Secondary | ICD-10-CM | POA: Diagnosis not present

## 2018-12-24 DIAGNOSIS — F064 Anxiety disorder due to known physiological condition: Secondary | ICD-10-CM | POA: Diagnosis not present

## 2018-12-24 DIAGNOSIS — R410 Disorientation, unspecified: Secondary | ICD-10-CM | POA: Diagnosis not present

## 2018-12-24 DIAGNOSIS — R404 Transient alteration of awareness: Secondary | ICD-10-CM | POA: Diagnosis not present

## 2018-12-24 DIAGNOSIS — R29898 Other symptoms and signs involving the musculoskeletal system: Secondary | ICD-10-CM | POA: Diagnosis not present

## 2018-12-24 DIAGNOSIS — J9601 Acute respiratory failure with hypoxia: Secondary | ICD-10-CM | POA: Diagnosis not present

## 2018-12-24 DIAGNOSIS — U071 COVID-19: Secondary | ICD-10-CM | POA: Diagnosis not present

## 2018-12-24 DIAGNOSIS — J189 Pneumonia, unspecified organism: Secondary | ICD-10-CM | POA: Diagnosis not present

## 2018-12-24 DIAGNOSIS — R0902 Hypoxemia: Secondary | ICD-10-CM | POA: Diagnosis not present

## 2018-12-24 DIAGNOSIS — Z20828 Contact with and (suspected) exposure to other viral communicable diseases: Secondary | ICD-10-CM | POA: Diagnosis not present

## 2018-12-27 DIAGNOSIS — Z7189 Other specified counseling: Secondary | ICD-10-CM | POA: Diagnosis not present

## 2018-12-27 DIAGNOSIS — R0603 Acute respiratory distress: Secondary | ICD-10-CM | POA: Diagnosis not present

## 2018-12-27 DIAGNOSIS — R451 Restlessness and agitation: Secondary | ICD-10-CM | POA: Diagnosis not present

## 2018-12-27 DIAGNOSIS — J189 Pneumonia, unspecified organism: Secondary | ICD-10-CM | POA: Diagnosis not present

## 2018-12-27 DIAGNOSIS — J9601 Acute respiratory failure with hypoxia: Secondary | ICD-10-CM | POA: Diagnosis not present

## 2018-12-27 DIAGNOSIS — Z515 Encounter for palliative care: Secondary | ICD-10-CM | POA: Diagnosis not present

## 2018-12-27 DIAGNOSIS — Z66 Do not resuscitate: Secondary | ICD-10-CM | POA: Diagnosis not present

## 2018-12-27 DIAGNOSIS — I2510 Atherosclerotic heart disease of native coronary artery without angina pectoris: Secondary | ICD-10-CM | POA: Diagnosis not present

## 2018-12-27 DIAGNOSIS — G4733 Obstructive sleep apnea (adult) (pediatric): Secondary | ICD-10-CM | POA: Diagnosis not present

## 2018-12-27 DIAGNOSIS — G309 Alzheimer's disease, unspecified: Secondary | ICD-10-CM | POA: Diagnosis not present

## 2018-12-27 DIAGNOSIS — G4730 Sleep apnea, unspecified: Secondary | ICD-10-CM | POA: Diagnosis not present

## 2018-12-27 DIAGNOSIS — G300 Alzheimer's disease with early onset: Secondary | ICD-10-CM | POA: Diagnosis not present

## 2018-12-27 DIAGNOSIS — F0280 Dementia in other diseases classified elsewhere without behavioral disturbance: Secondary | ICD-10-CM | POA: Diagnosis not present

## 2018-12-27 DIAGNOSIS — J1289 Other viral pneumonia: Secondary | ICD-10-CM | POA: Diagnosis not present

## 2018-12-27 DIAGNOSIS — Z20828 Contact with and (suspected) exposure to other viral communicable diseases: Secondary | ICD-10-CM | POA: Diagnosis not present

## 2018-12-27 DIAGNOSIS — U071 COVID-19: Secondary | ICD-10-CM | POA: Diagnosis not present

## 2019-01-24 DEATH — deceased
